# Patient Record
Sex: Female | Born: 1965
Health system: Southern US, Community
[De-identification: ages and names within clinical notes are randomized; demographics above are authoritative.]

## PROBLEM LIST (undated history)

## (undated) DIAGNOSIS — M1990 Unspecified osteoarthritis, unspecified site: Secondary | ICD-10-CM

## (undated) DIAGNOSIS — F32A Depression, unspecified: Secondary | ICD-10-CM

## (undated) DIAGNOSIS — K219 Gastro-esophageal reflux disease without esophagitis: Secondary | ICD-10-CM

## (undated) DIAGNOSIS — T7840XA Allergy, unspecified, initial encounter: Secondary | ICD-10-CM

## (undated) DIAGNOSIS — J45909 Unspecified asthma, uncomplicated: Secondary | ICD-10-CM

## (undated) HISTORY — DX: Unspecified asthma, uncomplicated: J45.909

## (undated) HISTORY — PX: COLONOSCOPY: SHX174

## (undated) HISTORY — DX: Gastro-esophageal reflux disease without esophagitis: K21.9

## (undated) HISTORY — DX: Depression, unspecified: F32.A

## (undated) HISTORY — PX: OTHER SURGICAL HISTORY: SHX169

## (undated) HISTORY — DX: Allergy, unspecified, initial encounter: T78.40XA

## (undated) HISTORY — DX: Unspecified osteoarthritis, unspecified site: M19.90

---

## 1998-06-23 ENCOUNTER — Encounter: Payer: Self-pay | Admitting: Emergency Medicine

## 1998-06-23 ENCOUNTER — Emergency Department (HOSPITAL_COMMUNITY): Admission: EM | Admit: 1998-06-23 | Discharge: 1998-06-23 | Payer: Self-pay | Admitting: Emergency Medicine

## 1999-01-12 ENCOUNTER — Other Ambulatory Visit: Admission: RE | Admit: 1999-01-12 | Discharge: 1999-01-12 | Payer: Self-pay | Admitting: Obstetrics and Gynecology

## 1999-06-07 ENCOUNTER — Inpatient Hospital Stay (HOSPITAL_COMMUNITY): Admission: AD | Admit: 1999-06-07 | Discharge: 1999-06-07 | Payer: Self-pay | Admitting: Obstetrics and Gynecology

## 1999-08-06 ENCOUNTER — Inpatient Hospital Stay (HOSPITAL_COMMUNITY): Admission: AD | Admit: 1999-08-06 | Discharge: 1999-08-08 | Payer: Self-pay | Admitting: Obstetrics and Gynecology

## 1999-08-11 ENCOUNTER — Encounter: Admission: RE | Admit: 1999-08-11 | Discharge: 1999-11-09 | Payer: Self-pay | Admitting: Obstetrics and Gynecology

## 1999-09-17 ENCOUNTER — Other Ambulatory Visit: Admission: RE | Admit: 1999-09-17 | Discharge: 1999-09-17 | Payer: Self-pay | Admitting: Obstetrics and Gynecology

## 1999-11-11 ENCOUNTER — Encounter: Admission: RE | Admit: 1999-11-11 | Discharge: 1999-12-12 | Payer: Self-pay | Admitting: Obstetrics and Gynecology

## 2000-10-03 ENCOUNTER — Other Ambulatory Visit: Admission: RE | Admit: 2000-10-03 | Discharge: 2000-10-03 | Payer: Self-pay | Admitting: Obstetrics and Gynecology

## 2001-02-02 ENCOUNTER — Ambulatory Visit (HOSPITAL_COMMUNITY): Admission: RE | Admit: 2001-02-02 | Discharge: 2001-02-02 | Payer: Self-pay | Admitting: Gastroenterology

## 2002-01-14 ENCOUNTER — Encounter: Payer: Self-pay | Admitting: Internal Medicine

## 2002-01-14 ENCOUNTER — Ambulatory Visit (HOSPITAL_COMMUNITY): Admission: RE | Admit: 2002-01-14 | Discharge: 2002-01-14 | Payer: Self-pay | Admitting: Internal Medicine

## 2003-02-08 ENCOUNTER — Other Ambulatory Visit: Admission: RE | Admit: 2003-02-08 | Discharge: 2003-02-08 | Payer: Self-pay | Admitting: Obstetrics and Gynecology

## 2004-02-10 ENCOUNTER — Other Ambulatory Visit: Admission: RE | Admit: 2004-02-10 | Discharge: 2004-02-10 | Payer: Self-pay | Admitting: Obstetrics and Gynecology

## 2004-07-26 ENCOUNTER — Ambulatory Visit: Payer: Self-pay | Admitting: Internal Medicine

## 2004-08-02 ENCOUNTER — Ambulatory Visit: Payer: Self-pay | Admitting: Internal Medicine

## 2005-01-14 ENCOUNTER — Ambulatory Visit: Payer: Self-pay | Admitting: Internal Medicine

## 2005-07-05 ENCOUNTER — Ambulatory Visit: Payer: Self-pay | Admitting: Internal Medicine

## 2005-10-10 ENCOUNTER — Ambulatory Visit: Payer: Self-pay | Admitting: Internal Medicine

## 2006-04-03 ENCOUNTER — Ambulatory Visit: Payer: Self-pay | Admitting: Internal Medicine

## 2006-04-03 LAB — CONVERTED CEMR LAB
Basophils Absolute: 0 10*3/uL (ref 0.0–0.1)
Basophils Relative: 0.2 % (ref 0.0–1.0)
Eosinophils Absolute: 0.1 10*3/uL (ref 0.0–0.6)
Eosinophils Relative: 1.8 % (ref 0.0–5.0)
HCT: 35.9 % — ABNORMAL LOW (ref 36.0–46.0)
Hemoglobin: 12.6 g/dL (ref 12.0–15.0)
Lymphocytes Relative: 27.3 % (ref 12.0–46.0)
MCHC: 35.1 g/dL (ref 30.0–36.0)
MCV: 93.4 fL (ref 78.0–100.0)
Monocytes Absolute: 0.8 10*3/uL — ABNORMAL HIGH (ref 0.2–0.7)
Monocytes Relative: 10.7 % (ref 3.0–11.0)
Neutro Abs: 4.4 10*3/uL (ref 1.4–7.7)
Neutrophils Relative %: 60 % (ref 43.0–77.0)
Platelets: 251 10*3/uL (ref 150–400)
RBC: 3.84 M/uL — ABNORMAL LOW (ref 3.87–5.11)
RDW: 11.3 % — ABNORMAL LOW (ref 11.5–14.6)
Sed Rate: 17 mm/hr (ref 0–25)
WBC: 7.3 10*3/uL (ref 4.5–10.5)

## 2006-07-29 ENCOUNTER — Ambulatory Visit: Payer: Self-pay | Admitting: Internal Medicine

## 2006-12-15 ENCOUNTER — Encounter: Payer: Self-pay | Admitting: Internal Medicine

## 2007-02-11 ENCOUNTER — Telehealth: Payer: Self-pay | Admitting: Internal Medicine

## 2007-02-28 ENCOUNTER — Ambulatory Visit: Payer: Self-pay | Admitting: Family Medicine

## 2007-02-28 DIAGNOSIS — J020 Streptococcal pharyngitis: Secondary | ICD-10-CM | POA: Insufficient documentation

## 2007-02-28 LAB — CONVERTED CEMR LAB: Rapid Strep: POSITIVE

## 2007-09-15 ENCOUNTER — Telehealth: Payer: Self-pay | Admitting: Internal Medicine

## 2007-11-12 ENCOUNTER — Ambulatory Visit: Payer: Self-pay | Admitting: Internal Medicine

## 2007-11-12 LAB — CONVERTED CEMR LAB
ALT: 16 units/L (ref 0–35)
AST: 22 units/L (ref 0–37)
Albumin: 4.1 g/dL (ref 3.5–5.2)
Alkaline Phosphatase: 58 units/L (ref 39–117)
BUN: 16 mg/dL (ref 6–23)
Basophils Absolute: 0.1 10*3/uL (ref 0.0–0.1)
Basophils Relative: 0.9 % (ref 0.0–3.0)
Bilirubin Urine: NEGATIVE
Bilirubin, Direct: 0.2 mg/dL (ref 0.0–0.3)
CO2: 27 meq/L (ref 19–32)
Calcium: 9.2 mg/dL (ref 8.4–10.5)
Chloride: 106 meq/L (ref 96–112)
Cholesterol: 199 mg/dL (ref 0–200)
Creatinine, Ser: 0.7 mg/dL (ref 0.4–1.2)
Crystals: NEGATIVE
Eosinophils Absolute: 0.1 10*3/uL (ref 0.0–0.7)
Eosinophils Relative: 2.1 % (ref 0.0–5.0)
GFR calc Af Amer: 119 mL/min
GFR calc non Af Amer: 99 mL/min
Glucose, Bld: 96 mg/dL (ref 70–99)
HCT: 35.1 % — ABNORMAL LOW (ref 36.0–46.0)
HDL: 49.9 mg/dL (ref >39.0)
Hemoglobin, Urine: NEGATIVE
Hemoglobin: 12.2 g/dL (ref 12.0–15.0)
Ketones, ur: NEGATIVE mg/dL
LDL Cholesterol: 131 mg/dL — ABNORMAL HIGH (ref 0–99)
Lymphocytes Relative: 24.1 % (ref 12.0–46.0)
MCHC: 34.7 g/dL (ref 30.0–36.0)
MCV: 92.9 fL (ref 78.0–100.0)
Monocytes Absolute: 0.6 10*3/uL (ref 0.1–1.0)
Monocytes Relative: 8.4 % (ref 3.0–12.0)
Neutro Abs: 4.5 10*3/uL (ref 1.4–7.7)
Neutrophils Relative %: 64.5 % (ref 43.0–77.0)
Nitrite: NEGATIVE
Platelets: 229 10*3/uL (ref 150–400)
Potassium: 4.2 meq/L (ref 3.5–5.1)
RBC: 3.78 M/uL — ABNORMAL LOW (ref 3.87–5.11)
RDW: 11 % — ABNORMAL LOW (ref 11.5–14.6)
Sodium: 143 meq/L (ref 135–145)
Specific Gravity, Urine: 1.02 (ref 1.000–1.03)
TSH: 1.61 microintl units/mL (ref 0.35–5.50)
Total Bilirubin: 1 mg/dL (ref 0.3–1.2)
Total CHOL/HDL Ratio: 4
Total Protein: 7 g/dL (ref 6.0–8.3)
Triglycerides: 91 mg/dL (ref 0–149)
Urine Glucose: NEGATIVE mg/dL
Urobilinogen, UA: 0.2 (ref 0.0–1.0)
VLDL: 18 mg/dL (ref 0–40)
WBC: 7 10*3/uL (ref 4.5–10.5)
pH: 6.5 (ref 5.0–8.0)

## 2007-11-17 ENCOUNTER — Ambulatory Visit: Payer: Self-pay | Admitting: Internal Medicine

## 2007-11-17 DIAGNOSIS — K219 Gastro-esophageal reflux disease without esophagitis: Secondary | ICD-10-CM | POA: Insufficient documentation

## 2007-11-17 DIAGNOSIS — J309 Allergic rhinitis, unspecified: Secondary | ICD-10-CM | POA: Insufficient documentation

## 2007-11-17 DIAGNOSIS — F32A Depression, unspecified: Secondary | ICD-10-CM | POA: Insufficient documentation

## 2007-11-17 DIAGNOSIS — F329 Major depressive disorder, single episode, unspecified: Secondary | ICD-10-CM | POA: Insufficient documentation

## 2007-11-17 DIAGNOSIS — F3289 Other specified depressive episodes: Secondary | ICD-10-CM | POA: Insufficient documentation

## 2007-12-01 ENCOUNTER — Ambulatory Visit: Payer: Self-pay | Admitting: Internal Medicine

## 2007-12-01 ENCOUNTER — Encounter: Payer: Self-pay | Admitting: Internal Medicine

## 2007-12-01 DIAGNOSIS — R05 Cough: Secondary | ICD-10-CM

## 2007-12-01 DIAGNOSIS — R059 Cough, unspecified: Secondary | ICD-10-CM | POA: Insufficient documentation

## 2008-01-12 ENCOUNTER — Ambulatory Visit: Payer: Self-pay | Admitting: Internal Medicine

## 2008-01-12 DIAGNOSIS — J45909 Unspecified asthma, uncomplicated: Secondary | ICD-10-CM

## 2008-01-12 DIAGNOSIS — J019 Acute sinusitis, unspecified: Secondary | ICD-10-CM

## 2008-05-23 ENCOUNTER — Ambulatory Visit: Payer: Self-pay | Admitting: Endocrinology

## 2008-05-23 DIAGNOSIS — J069 Acute upper respiratory infection, unspecified: Secondary | ICD-10-CM | POA: Insufficient documentation

## 2008-05-23 LAB — CONVERTED CEMR LAB: Rapid Strep: NEGATIVE

## 2008-06-24 ENCOUNTER — Encounter: Payer: Self-pay | Admitting: Internal Medicine

## 2008-09-06 ENCOUNTER — Encounter: Payer: Self-pay | Admitting: Internal Medicine

## 2009-01-24 ENCOUNTER — Telehealth: Payer: Self-pay | Admitting: Internal Medicine

## 2009-02-27 ENCOUNTER — Encounter: Payer: Self-pay | Admitting: Internal Medicine

## 2009-03-25 ENCOUNTER — Ambulatory Visit: Payer: Self-pay | Admitting: Internal Medicine

## 2009-03-25 DIAGNOSIS — J111 Influenza due to unidentified influenza virus with other respiratory manifestations: Secondary | ICD-10-CM

## 2009-06-27 ENCOUNTER — Ambulatory Visit: Payer: Self-pay | Admitting: Internal Medicine

## 2009-06-27 DIAGNOSIS — J040 Acute laryngitis: Secondary | ICD-10-CM | POA: Insufficient documentation

## 2009-06-27 DIAGNOSIS — J209 Acute bronchitis, unspecified: Secondary | ICD-10-CM | POA: Insufficient documentation

## 2010-01-04 ENCOUNTER — Encounter: Payer: Self-pay | Admitting: Internal Medicine

## 2010-02-27 NOTE — Assessment & Plan Note (Signed)
Summary: COUGH/CHEST HURTS TO COUGH/2 WKS /NWS   Vital Signs:  Patient profile:   45 year old female Height:      63 inches Weight:      157 pounds BMI:     27.91 O2 Sat:      98 % on Room air Temp:     98.8 degrees F oral Pulse rate:   93 / minute BP sitting:   110 / 70  (left arm) Cuff size:   regular  Vitals Entered By: Lucious Groves (Jun 27, 2009 4:54 PM)  O2 Flow:  Room air CC: C/O cough that is causing sore throat and chest discomfort x2 weeks. Pt also notes fever, mucous production, wheezing, and ear pain./kb Is Patient Diabetic? No Pain Assessment Patient in pain? no      Comments Patient denies any chest discomfort prior to cough. Patient also notes that she has began taking spouses cipro./kb   CC:  C/O cough that is causing sore throat and chest discomfort x2 weeks. Pt also notes fever, mucous production, wheezing, and and ear pain./kb.  History of Present Illness: The patient presents with complaints of sore throat, fever, cough, sinus congestion and drainge of several days duration. Not better with OTC meds. Chest hurts with coughing. Can't sleep due to cough. Muscle aches are present.  The mucus is colored. Can't sing - hoarse. C/o wheezing.   Current Medications (verified): 1)  Wellbutrin Xl 150 Mg  Tb24 (Bupropion Hcl) .Marland Kitchen.. 1 Once Daily in Am 2)  Zegerid 40-1100 Mg Caps (Omeprazole-Sodium Bicarbonate) .Marland Kitchen.. 1 Tab Once A Day  Allergies (verified): 1)  Avelox  Past History:  Past Medical History: Last updated: 01/12/2008 Depression GERD Allergic rhinitis Gyn Dr Arelia Sneddon Poss. asthma  Social History: Last updated: 12/01/2007 Occupation: Married Former Smoker Regular exercise-yes Alcohol use-no Drug use-no  Physical Exam  General:  NAD,  non toxic, congested in head Ears:  Fluid behind B TMs Mouth:  Erythematous throat mucosa and intranasal erythema.  Lungs:  CTA Heart:  RRR Abdomen:  Bowel sounds positive,abdomen soft and non-tender without  masses, organomegaly or hernias noted. Skin:  Intact without suspicious lesions or rashes Cervical Nodes:  R anterior LN tender and L anterior LN tender.     Impression & Recommendations:  Problem # 1:  ASTHMA (ICD-493.90) - asthmatic bronchitis Assessment Deteriorated  Her updated medication list for this problem includes:    Proair Hfa 108 (90 Base) Mcg/act Aers (Albuterol sulfate) .Marland Kitchen... 2 inh qid as needed  Orders: Depo- Medrol 80mg  (J1040) Depo- Medrol 40mg  (J1030) Admin of Therapeutic Inj  intramuscular or subcutaneous (16109)  Problem # 2:  BRONCHITIS, ACUTE (ICD-466.0) Assessment: New  Her updated medication list for this problem includes:    Zithromax Z-pak 250 Mg Tabs (Azithromycin) .Marland Kitchen... As dirrected    Tessalon Perles 100 Mg Caps (Benzonatate) .Marland Kitchen... 1-2 by mouth two times a day as needed cogh    Proair Hfa 108 (90 Base) Mcg/act Aers (Albuterol sulfate) .Marland Kitchen... 2 inh qid as needed  Orders: Depo- Medrol 80mg  (J1040) Depo- Medrol 40mg  (J1030) Admin of Therapeutic Inj  intramuscular or subcutaneous (60454)  Problem # 3:  LARYNGITIS, ACUTE (ICD-464.00) Assessment: New See "Patient Instructions".   Problem # 4:  ALLERGIC RHINITIS (ICD-477.9) Assessment: Unchanged  Her updated medication list for this problem includes:    Loratadine 10 Mg Tabs (Loratadine) .Marland Kitchen... 1 by mouth once daily as needed allergies  Complete Medication List: 1)  Wellbutrin Xl 150 Mg Tb24 (Bupropion hcl) .Marland KitchenMarland KitchenMarland Kitchen  1 once daily in am 2)  Zegerid 40-1100 Mg Caps (Omeprazole-sodium bicarbonate) .Marland Kitchen.. 1 tab once a day 3)  Loratadine 10 Mg Tabs (Loratadine) .Marland Kitchen.. 1 by mouth once daily as needed allergies 4)  Zithromax Z-pak 250 Mg Tabs (Azithromycin) .... As dirrected 5)  Tessalon Perles 100 Mg Caps (Benzonatate) .Marland Kitchen.. 1-2 by mouth two times a day as needed cogh 6)  Proair Hfa 108 (90 Base) Mcg/act Aers (Albuterol sulfate) .... 2 inh qid as needed  Patient Instructions: 1)  Use over-the-counter medicines  for "cold": Tylenol  650mg  or Advil 400mg  every 6 hours  for fever; Delsym or Robutussin for cough. Mucinex or Mucinex D for congestion. Ricola or Halls for sore throat. Office visit if not better or if worse.  2)  Voice rest Prescriptions: PROAIR HFA 108 (90 BASE) MCG/ACT AERS (ALBUTEROL SULFATE) 2 inh qid as needed  #1 x 3   Entered and Authorized by:   Tresa Garter MD   Signed by:   Tresa Garter MD on 06/27/2009   Method used:   Print then Give to Patient   RxID:   [id number] TESSALON PERLES 100 MG CAPS (BENZONATATE) 1-2 by mouth two times a day as needed cogh  #120 x 1   Entered and Authorized by:   Tresa Garter MD   Signed by:   Tresa Garter MD on 06/27/2009   Method used:   Print then Give to Patient   RxID:   [id number] QIONGEXBM Z-PAK 250 MG TABS (AZITHROMYCIN) as dirrected  #1 x 0   Entered and Authorized by:   Tresa Garter MD   Signed by:   Tresa Garter MD on 06/27/2009   Method used:   Print then Give to Patient   RxID:   [id number] LORATADINE 10 MG TABS (LORATADINE) 1 by mouth once daily as needed allergies  #30 x 6   Entered and Authorized by:   Tresa Garter MD   Signed by:   Tresa Garter MD on 06/27/2009   Method used:   Print then Give to Patient   RxID:   [id number] PROAIR HFA 108 (90 BASE) MCG/ACT AERS (ALBUTEROL SULFATE) 2 inh qid as needed  #3 x 3   Entered and Authorized by:   Tresa Garter MD   Signed by:   Tresa Garter MD on 06/27/2009   Method used:   Electronically to        [facility] [address] [phone]* (retail)       [address]       [place], [place]  [address]       Ph: [phone] or [phone]       Fax: [phone]   RxID:   [id number] TESSALON PERLES 100 MG CAPS (BENZONATATE) 1-2 by mouth two times a day as needed cogh  #120 x 1   Entered and Authorized by:   Tresa Garter MD   Signed by:   Tresa Garter MD on  06/27/2009   Method used:   Electronically to        [facility] [address] [phone]* (retail)       [address]       [place], [place]  [address]       Ph: [phone] or [phone]       Fax: [phone]   RxID:  [id number] ZITHROMAX Z-PAK 250 MG TABS (AZITHROMYCIN) as dirrected  #1 x 0   Entered and Authorized by:   Tresa Garter MD   Signed by:   Tresa Garter MD on 06/27/2009   Method used:   Electronically to        [facility] [address] [phone]* (retail)       [address]       [place], [place]  [address]       Ph: [phone] or [phone]       Fax: [phone]   RxID:   [id number] LORATADINE 10 MG TABS (LORATADINE) 1 by mouth once daily as needed allergies  #30 x 6   Entered and Authorized by:   Tresa Garter MD   Signed by:   Tresa Garter MD on 06/27/2009   Method used:   Electronically to        [facility] [address] [phone]* (retail)       [address]       [place], [place]  [address]       Ph: [phone] or [phone]       Fax: [phone]   RxID:   [id number]    Medication Administration  Injection # 1:    Medication: Depo- Medrol 80mg     Diagnosis: ASTHMA (ICD-493.90)    Route: IM    Site: LUOQ gluteus    Exp Date: 04/28/2012    Lot #: [id number]    Mfr: Pharmacia    Patient tolerated injection without complications    Given by: Lamar Sprinkles, CMA (Jun 27, 2009 6:10 PM)  Injection # 2:    Medication: Depo- Medrol 40mg     Diagnosis: ASTHMA (ICD-493.90)    Comments: Same as above     Patient tolerated injection without complications    Given by: Lamar Sprinkles, CMA (Jun 27, 2009 6:10 PM)  Orders Added: 1)  Depo- Medrol 80mg  [J1040] 2)  Depo- Medrol 40mg  [J1030] 3)  Admin of Therapeutic Inj  intramuscular or subcutaneous [96372] 4)  Est. Patient Level IV [60737]

## 2010-02-27 NOTE — Procedures (Signed)
Summary: Colonoscopy/[facility]  Colonoscopy/[facility]   Imported By: Sherian Rein 03/15/2009 10:05:33  _____________________________________________________________________  External Attachment:    Type:   Image     Comment:   External Document

## 2010-02-27 NOTE — Assessment & Plan Note (Signed)
Summary: FLU LIKE,HAD FEVER,LIL COUGH/PN   Vital Signs:  Patient profile:   45 year old female Height:      63 inches Weight:      155 pounds BMI:     27.56 O2 Sat:      97 % Temp:     98.5 degrees F Pulse rate:   78 / minute BP sitting:   120 / 80  Vitals Entered By: Lamar Sprinkles, CMA (March 25, 2009 11:35 AM) CC: Pt c/o fever, 101, tuesday thru thursday. Also c/o sinus congestion/pressure, ear pain & non productive cough. Has tried OTC delsym & cold meds.   History of Present Illness: Shannon [name] comesin today for   for [facility] because of above reasons. Onset with fever 3 cough 4 days ago.  and temp 101 range   now down to 99 and fever broke yesterday but has severe fatigue and myalgias and sinuses are tender and clogged.  No cp or sob.   Didnt get flu shot this year.   Wonders if a shot of steroids would help her fatigue as she has had this before for some problems  No asthma flare .   Preventive Screening-Counseling & Management  Alcohol-Tobacco     Smoking Status: quit     Passive Smoke Exposure: yes  Caffeine-Diet-Exercise     Does Patient Exercise: yes  Current Medications (verified): 1)  Wellbutrin Xl 150 Mg  Tb24 (Bupropion Hcl) .Marland Kitchen.. 1 Once Daily in Am 2)  Zegerid 40-1100 Mg Caps (Omeprazole-Sodium Bicarbonate) .Marland Kitchen.. 1 Tab Once A Day  Allergies (verified): 1)  Avelox  Past History:  Past medical, surgical, family and social histories (including risk factors) reviewed for relevance to current acute and chronic problems.  Past Medical History: Reviewed history from 01/12/2008 and no changes required. Depression GERD Allergic rhinitis Gyn Dr Arelia Sneddon Poss. asthma  Past Surgical History: Reviewed history from 12/01/2007 and no changes required. Denies surgical history  Family History: Reviewed history from 11/17/2007 and no changes required. Well  Social History: Reviewed history from 12/01/2007 and no changes  required. Occupation: Married Former Smoker Regular exercise-yes Alcohol use-no Drug use-no  Review of Systems       The patient complains of anorexia, fever, and headaches.  The patient denies vision loss, hoarseness, chest pain, syncope, dyspnea on exertion, peripheral edema, prolonged cough, hemoptysis, abdominal pain, melena, hematochezia, unusual weight change, abnormal bleeding, enlarged lymph nodes, and angioedema.         some sore throat  Physical Exam  General:  tired non toxic in nad   congested in head Head:  normocephalic and atraumatic.   Eyes:  vision grossly intact, pupils equal, and pupils round.   Ears:  R ear normal, L ear normal, and no external deformities.   Nose:  no external deformity, no external erythema, and no nasal discharge.  congested mild facila tenderness Mouth:  pharynx pink and moist.  mild erythema no edema Neck:  No deformities, masses, or tenderness noted. Lungs:  Normal respiratory effort, chest expands symmetrically. Lungs are clear to auscultation, no crackles or wheezes.no dullness.   Heart:  Normal rate and regular rhythm. S1 and S2 normal without gallop, murmur, click, rub or other extra sounds.no lifts.   Pulses:  pulses intact without delay   nl cap arefill  Extremities:  no clubbing cyanosis or edema  Neurologic:  non focal  nl gait  Skin:  turgor normal, color normal, no ecchymoses, no petechiae, and no purpura.   Cervical  Nodes:  No lymphadenopathy noted Psych:  Oriented X3, good eye contact, not anxious appearing, and not depressed appearing.     Impression & Recommendations:  Problem # 1:  INFLUENZA LIKE ILLNESS (ICD-487.1) with sinusitis signs but  no alarm features . ne evidence of pneumonia  or other complications  decline cough med rx .   to restart on her nasal steroids  Problem # 2:  ALLERGIC RHINITIS (ICD-477.9) restart her nasal steroids  disc  lifetime risk of systemic steroids  for osteoporosis  as her mom has this.   The following medications were removed from the medication list:    Astelin 137 Mcg/spray Soln (Azelastine hcl) .Marland Kitchen... 1 gtt 1 each nostr at bedtime  Complete Medication List: 1)  Wellbutrin Xl 150 Mg Tb24 (Bupropion hcl) .Marland Kitchen.. 1 once daily in am 2)  Zegerid 40-1100 Mg Caps (Omeprazole-sodium bicarbonate) .Marland Kitchen.. 1 tab once a day  Patient Instructions: 1)  saline decongestants     2)  Acute sinusitis symptoms for less than 10 days are not helped by antibiotics. Use warm moist compresses, and over the counter decongestants( only as directed). Call if no improvement in 5-7 days, sooner if increasing pain, fever, or new symptoms.  3)  rest fluids

## 2010-03-01 NOTE — Letter (Signed)
Summary: [facility]  [facility]   Imported By: Sherian Rein 01/26/2010 09:15:09  _____________________________________________________________________  External Attachment:    Type:   Image     Comment:   External Document

## 2010-03-02 NOTE — Procedures (Signed)
Summary: Panendoscopy/[facility]  Panendoscopy/[facility]   Imported By: Sherian Rein 03/15/2009 10:06:35  _____________________________________________________________________  External Attachment:    Type:   Image     Comment:   External Document

## 2010-03-16 ENCOUNTER — Ambulatory Visit: Payer: Self-pay | Admitting: Internal Medicine

## 2010-03-19 ENCOUNTER — Encounter: Payer: Self-pay | Admitting: Internal Medicine

## 2010-03-19 ENCOUNTER — Ambulatory Visit (INDEPENDENT_AMBULATORY_CARE_PROVIDER_SITE_OTHER): Payer: BC Managed Care – PPO | Admitting: Internal Medicine

## 2010-03-19 DIAGNOSIS — J45909 Unspecified asthma, uncomplicated: Secondary | ICD-10-CM

## 2010-03-19 DIAGNOSIS — L089 Local infection of the skin and subcutaneous tissue, unspecified: Secondary | ICD-10-CM

## 2010-03-19 DIAGNOSIS — S90859A Superficial foreign body, unspecified foot, initial encounter: Secondary | ICD-10-CM

## 2010-03-19 DIAGNOSIS — J019 Acute sinusitis, unspecified: Secondary | ICD-10-CM

## 2010-03-27 NOTE — Assessment & Plan Note (Signed)
Summary: GLASS IN FOOT SINCE AUG/NWS   Vital Signs:  Patient profile:   45 year old female Height:      63 inches Weight:      160.13 pounds BMI:     28.47 O2 Sat:      95 % on Room air Temp:     99 degrees F oral Pulse rate:   90 / minute BP sitting:   100 / 68  (left arm) Cuff size:   regular  Vitals Entered By: Zella Ball Ewing CMA Duncan Dull) (March 19, 2010 9:32 AM)  O2 Flow:  Room air CC: Right foot pain, stepped on glass/RE   CC:  Right foot pain and stepped on glass/RE.  History of Present Illness: pt of dr plotnikov who is here with 3 days acute onset facial pain, pressure, feverish and greenish d/c, wtih mild HA, general weakness and malaise, but Pt denies CP, worsening sob, doe, wheezing, night time awakenings with dyspnea and rare use of inhaler;  no orthopnea, pnd, worsening LE edema, palps, dizziness or syncope  Pt denies new neuro symptoms such as  facial or extremity weakness  Pt denies polydipsia, polyuria.  Did also step on glass she thinks with right foot with some glass in the foot she thought she got out, but now with 3 mo gradually increased pain now quite painful mod to severe with each step in the same area.  No fever, redness, swelling , drainage .  No recent wt loss, night sweats, loss of appetite or other constitutional symptoms   Problems Prior to Update: 1)  Foot&toe Sup Fb Without Major Open Wound Inf  (ICD-917.7) 2)  Sinusitis- Acute-nos  (ICD-461.9) 3)  Laryngitis, Acute  (ICD-464.00) 4)  Bronchitis, Acute  (ICD-466.0) 5)  Influenza Like Illness  (ICD-487.1) 6)  Uri  (ICD-465.9) 7)  Asthma  (ICD-493.90) 8)  Sinusitis, Acute  (ICD-461.9) 9)  Cough  (ICD-786.2) 10)  Well Adult Exam  (ICD-V70.0) 11)  Allergic Rhinitis  (ICD-477.9) 12)  Gerd  (ICD-530.81) 13)  Depression  (ICD-311) 14)  Streptococcal Pharyngitis  (ICD-034.0)  Medications Prior to Update: 1)  Wellbutrin Xl 150 Mg  Tb24 (Bupropion Hcl) .Marland Kitchen.. 1 Once Daily in Am 2)  Zegerid 40-1100 Mg Caps  (Omeprazole-Sodium Bicarbonate) .Marland Kitchen.. 1 Tab Once A Day 3)  Loratadine 10 Mg Tabs (Loratadine) .Marland Kitchen.. 1 By Mouth Once Daily As Needed Allergies 4)  Tessalon Perles 100 Mg Caps (Benzonatate) .Marland Kitchen.. 1-2 By Mouth Two Times A Day As Needed Cogh 5)  Proair Hfa 108 (90 Base) Mcg/act Aers (Albuterol Sulfate) .... 2 Inh Qid As Needed  Current Medications (verified): 1)  Wellbutrin Xl 150 Mg  Tb24 (Bupropion Hcl) .Marland Kitchen.. 1 Once Daily in Am 2)  Zegerid 40-1100 Mg Caps (Omeprazole-Sodium Bicarbonate) .Marland Kitchen.. 1 Tab Once A Day 3)  Loratadine 10 Mg Tabs (Loratadine) .Marland Kitchen.. 1 By Mouth Once Daily As Needed Allergies 4)  Tessalon Perles 100 Mg Caps (Benzonatate) .Marland Kitchen.. 1-2 By Mouth Two Times A Day As Needed Cogh 5)  Proair Hfa 108 (90 Base) Mcg/act Aers (Albuterol Sulfate) .... 2 Inh Qid As Needed 6)  Azithromycin 250 Mg Tabs (Azithromycin) .... 2po Qd For 1 Day, Then 1po Qd For 4days, Then Stop  Allergies (verified): 1)  Avelox  Past History:  Past Medical History: Last updated: 01/12/2008 Depression GERD Allergic rhinitis Gyn Dr Arelia Sneddon Poss. 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   Eyes:  vision grossly intact, pupils equal, and pupils round.   Ears:  bilat tm's erythema without bulging,  canals clear, sinus tender  bilat Nose:  nasal dischargemucosal pallor and mucosal edema.   Mouth:  pharyngeal erythema and fair dentition.   Neck:  supple and no masses.   Lungs:  normal respiratory effort and normal breath sounds.   Heart:  normal rate and regular rhythm.   Extremities:  tender area to right 5th MTP area - ? corn/wart vs retained foreign body? - o/w no edema, no erythema , fluctuance or drainage Skin:  color normal and no rashes.     Impression & Recommendations:  Problem # 1:  SINUSITIS- ACUTE-NOS (ICD-461.9)  Her updated medication list for this problem includes:    Tessalon Perles 100 Mg Caps (Benzonatate) .Marland Kitchen... 1-2 by mouth two times a day as needed cogh    Azithromycin 250 Mg Tabs (Azithromycin) .Marland Kitchen... 2po qd for 1 day, then 1po qd for 4days, then stop treat as above, f/u any worsening signs or symptoms   Problem # 2:  FOOT&TOE SUP FB WITHOUT MAJOR OPEN WOUND INF (ICD-917.7)  vs corn or wart - for podiatry referral, no film needed if FB is glass as will not show on film  Orders: Podiatry Referral (Podiatry)  Problem # 3:  ASTHMA (ICD-493.90)  Her updated medication list for this problem includes:    Proair Hfa 108 (90 Base) Mcg/act Aers (Albuterol sulfate) .Marland Kitchen... 2 inh qid as needed stable overall by hx and exam, ok to continue meds/tx as is   Pulmonary Functions Reviewed: O2 sat: 95 (03/19/2010)  Complete Medication List: 1)  Wellbutrin Xl 150 Mg Tb24 (Bupropion hcl) .Marland Kitchen.. 1 once daily in am 2)  Zegerid 40-1100 Mg Caps (Omeprazole-sodium bicarbonate) .Marland Kitchen.. 1 tab once a day 3)  Loratadine 10 Mg Tabs (Loratadine) .Marland Kitchen.. 1 by mouth once daily as needed allergies 4)  Tessalon Perles 100 Mg Caps (Benzonatate) .Marland Kitchen.. 1-2 by mouth two times a day as needed cogh 5)  Proair Hfa 108 (90 Base) Mcg/act Aers (Albuterol sulfate) .... 2 inh qid as needed 6)  Azithromycin 250 Mg Tabs (Azithromycin) .... 2po qd for 1 day, then 1po qd for 4days, then stop  Patient Instructions: 1)  Please take all new  medications as prescribed 2)  Continue all previous medications as before this visit  3)  You will be contacted about the referral(s) to: podiatry 4)  Please schedule an appointment with your primary doctor as needed Prescriptions: AZITHROMYCIN 250 MG TABS (AZITHROMYCIN) 2po qd for 1 day, then 1po qd for 4days, then stop  #6 x 1   Entered and Authorized by:   Corwin Levins MD   Signed by:   Corwin Levins MD on 03/19/2010   Method used:   Print then Give to Patient   RxID:   [id number]    Orders Added: 1)  Podiatry Referral [Podiatry] 2)  Est. Patient Level IV [14782]

## 2010-03-30 ENCOUNTER — Encounter: Payer: Self-pay | Admitting: Internal Medicine

## 2010-04-10 NOTE — Letter (Signed)
Summary: [facility]   Imported By: Lester Ashley 04/05/2010 10:10:14  _____________________________________________________________________  External Attachment:    Type:   Image     Comment:   External Document

## 2010-06-12 NOTE — Assessment & Plan Note (Signed)
[facility]                           PRIMARY CARE OFFICE NOTE   NAME:[name], Shannon ANN                  MRN:          [id number]  DATE:07/29/2006                            DOB:          1965/10/17    PROCEDURE:  Cryosurgery.   INDICATION:  Wart right lateral ankle.   The risk and benefits explained to patient.  She agreed to proceed.  She  was placed in the decubitus position.  A 3 mm wart on the right distal  lateral of the shin was treated with liquid nitrogen in the usual  fashion.  She tolerated it well, complications none.     Georgina Quint. Plotnikov, MD  Electronically Signed    AVP/MedQ  DD: 08/01/2006  DT: 08/01/2006  Job #: [id number]

## 2010-09-03 ENCOUNTER — Telehealth: Payer: Self-pay | Admitting: *Deleted

## 2010-09-03 MED ORDER — ZOLPIDEM TARTRATE 10 MG PO TABS: 10.0000 mg | ORAL_TABLET | Freq: Every evening | ORAL | Status: AC | PRN

## 2010-09-03 NOTE — Telephone Encounter (Signed)
Patient requesting Rx for ambien. She is having trouble sleeping x 2 weeks. Next ov is 09/2010.

## 2010-09-03 NOTE — Telephone Encounter (Signed)
Ok Thx 

## 2010-09-04 NOTE — Telephone Encounter (Signed)
Pt is aware - rx was faxed to pharm

## 2010-09-17 ENCOUNTER — Telehealth: Payer: Self-pay

## 2010-09-17 NOTE — Telephone Encounter (Signed)
[facility] Triage Call Report Triage Record Num: [id number] Operator: Lodema Pilot Patient Name: Shannon [name] Call Date & Time: 09/16/2010 9:36:12AM Patient Phone: [phone] PCP: Sonda Primes Patient Gender: Female PCP Fax : [phone] Patient DOB: [birth year] Practice Name: [facility] Reason for Call: Briyanna is calling about sore throat x 1- 2 days. Afebrile. Neck swollen. Per Sore Throat Protocol, advised pt to see a MD within 24 hours - UC or call office in AM. Home care advice given. Protocol(s) Used: Sore Throat or Hoarseness Recommended Outcome per Protocol: See Provider within 24 hours Reason for Outcome: Has enlarged tonsils covered by yellow-white patches Care Advice: ~ Use a cool mist humidifier to moisten air. Be sure to clean according to manufacturer's instructions. Avoid exposure to environmental irritants. Do not smoke and avoid second-hand smoke. Avoid outside activities on high pollution days. Instead of strong smelling commercial cleaning products, substitute vinegar and lemon juice. ~ Antibiotics may be prescribed by a provider after confirmation of strep throat or in a high risk situation, until a diagnosis is confirmed. Inform provider of any previous reaction or sensitivity to penicillin. Complete entire course of antibiotics, even when feeling better, if ordered by your provider. ~ ~ HEALTH PROMOTION / MAINTENANCE ~ SYMPTOM / CONDITION MANAGEMENT ~ CAUTIONS Analgesic/Antipyretic Advice - Acetaminophen: Consider acetaminophen as directed on label or by pharmacist/provider for pain or fever PRECAUTIONS: - Use if there is no history of liver disease, alcoholism, or intake of three or more alcohol drinks per day - Only if approved by provider during pregnancy or when breastfeeding - During pregnancy, acetaminophen should not be taken more than 3 consecutive days without telling provider - Do not exceed recommended dose or frequency ~ ~ Call 911 if  voice muffled, is unable to swallow own saliva and is drooling or choking sensation. Sore Throat Relief: - Use warm salt water gargles 3 to 4 times/day, as needed (1/2 tsp. salt in 8 oz. [.2 liters] water). - Suck on hard candy, nonprescription or herbal throat lozenges (sugar-free if diabetic) - Eat soothing, soft food/fluids (broths, soups, or honey and lemon juice in hot tea, Popsicles, frozen yogurt or sherbet, scrambled eggs, cooked cereals, Jell-O or puddings) whichever is most comforting. - Avoid eating salty, spicy or acidic foods. ~ Analgesic/Antipyretic Advice - NSAIDs: Consider aspirin, ibuprofen, naproxen or ketoprofen for pain or fever as directed on label or by pharmacist/provider. PRECAUTIONS: - If over 46 years of age, should not take longer than 1 week without consulting provider. EXCEPTIONS: - Should not be used if taking blood thinners or have bleeding problems. - Do not use if have history of sensitivity/allergy to any of these medications; or history of cardiovascular, ulcer, kidney, liver disease or diabetes unless approved by provider. - Do not exceed recommended dose or frequency. ~ 09/16/2010 9:46:54AM Page 1 of 1 [id number]

## 2010-09-22 ENCOUNTER — Encounter: Payer: Self-pay | Admitting: Family Medicine

## 2010-09-22 ENCOUNTER — Ambulatory Visit (INDEPENDENT_AMBULATORY_CARE_PROVIDER_SITE_OTHER): Payer: BC Managed Care – PPO | Admitting: Family Medicine

## 2010-09-22 VITALS — BP 120/80 | Temp 98.7°F | Wt 159.1 lb

## 2010-09-22 DIAGNOSIS — J0100 Acute maxillary sinusitis, unspecified: Secondary | ICD-10-CM

## 2010-09-22 MED ORDER — AMOXICILLIN-POT CLAVULANATE 875-125 MG PO TABS: 1.0000 | ORAL_TABLET | Freq: Two times a day (BID) | ORAL | Status: AC

## 2010-09-22 NOTE — Progress Notes (Signed)
  Subjective:    Patient ID: Shannon [name], female    DOB: 1965-07-01, 45 y.o.   MRN: [id number]  HPI   Shannon [name], a 45 y.o. female presents today in the office for the following:    Last Sunday, had a bad sore throat and fever. Cough is very bad at night. Also has some bad green drainage. 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   Fever was 101.  Facial pain, tooth, jaw - pain Green mucous drainage from nose Some cough No nausea, vomitting or diarrhea  Taking Amox 1500 mg po once daily right now without improvement  The PMH, PSH, Social History, Family History, Medications, and allergies have been reviewed in [facility], and have been updated if relevant.  Review of Systems ROS: GEN: Acute illness details above GI: Tolerating PO intake GU: maintaining adequate hydration and urination Pulm: No SOB Interactive and getting along well at home.  Otherwise, ROS is as per the HPI.     Objective:   Physical Exam   Physical Exam  Blood pressure 120/80, temperature 98.7 F (37.1 C), temperature source Oral, weight 159 lb 1.3 oz (72.158 kg).  Gen: WDWN, NAD; alert,appropriate and cooperative throughout exam  HEENT: Normocephalic and atraumatic. Throat clear, w/o exudate, no LAD, R TM clear, L TM - good landmarks, No fluid present. rhinnorhea.  Left frontal and maxillary sinuses: Tender at max Right frontal and maxillary sinuses: Tender at max  Neck: No ant or post LAD CV: RRR, No M/G/R Pulm: Breathing comfortably in no resp distress. no w/c/r Abd: S,NT,ND,+BS Extr: no c/c/e Psych: full affect, pleasant       Assessment & Plan:   1. Sinusitis, acute maxillary  amoxicillin-clavulanate (AUGMENTIN) 875-125 MG per tablet   A/P: 1. Acute sinusitis: ABX as below.  Refer to the patient instructions sections for details of plan shared with patient.  Reviewed symptomatic care as well as ABX in this case.

## 2010-09-22 NOTE — Patient Instructions (Signed)
SINUSITIS Sinuses are cavities in facial skeleton that drain to nose. Impaired drainage and obstruction of sinus passages main cause.  Treatment: 1. Take all Antibiotics 2. Open nasal and sinus canals: Oral decongestant: Sudafed. (CAUTION IF HIGH BLOOD PRESSURE) 3. Steam inhalation 4. Humidifier in room 5. Frequent nasal saline irrigation 6. Moist heat compresses to face 7. Tylenol or Ibuprofen for pain and fever, follow directions on bottle.  

## 2010-10-02 ENCOUNTER — Ambulatory Visit: Payer: BC Managed Care – PPO

## 2010-10-02 DIAGNOSIS — Z0000 Encounter for general adult medical examination without abnormal findings: Secondary | ICD-10-CM

## 2010-10-02 LAB — HEPATIC FUNCTION PANEL
ALT: 19 U/L (ref 0–35)
AST: 18 U/L (ref 0–37)
Alkaline Phosphatase: 66 U/L (ref 39–117)
Bilirubin, Direct: 0.1 mg/dL (ref 0.0–0.3)
Total Protein: 6.9 g/dL (ref 6.0–8.3)

## 2010-10-02 LAB — URINALYSIS
Bilirubin Urine: NEGATIVE
Hgb urine dipstick: NEGATIVE
Leukocytes, UA: NEGATIVE
Nitrite: NEGATIVE
Urobilinogen, UA: 0.2 (ref 0.0–1.0)

## 2010-10-02 LAB — BASIC METABOLIC PANEL
BUN: 13 mg/dL (ref 6–23)
CO2: 27 mEq/L (ref 19–32)
GFR: 115.73 mL/min (ref >60.00)
Glucose, Bld: 93 mg/dL (ref 70–99)
Potassium: 3.7 mEq/L (ref 3.5–5.1)
Sodium: 140 mEq/L (ref 135–145)

## 2010-10-02 LAB — CBC WITH DIFFERENTIAL/PLATELET
Basophils Relative: 0.4 % (ref 0.0–3.0)
Eosinophils Absolute: 0.3 10*3/uL (ref 0.0–0.7)
MCHC: 34.6 g/dL (ref 30.0–36.0)
MCV: 94 fl (ref 78.0–100.0)
Monocytes Absolute: 0.6 10*3/uL (ref 0.1–1.0)
Neutrophils Relative %: 65.8 % (ref 43.0–77.0)
RBC: 3.73 Mil/uL — ABNORMAL LOW (ref 3.87–5.11)

## 2010-10-02 LAB — LIPID PANEL
HDL: 46.2 mg/dL (ref >39.00)
LDL Cholesterol: 105 mg/dL — ABNORMAL HIGH (ref 0–99)
Total CHOL/HDL Ratio: 4
VLDL: 24.2 mg/dL (ref 0.0–40.0)

## 2010-10-05 ENCOUNTER — Ambulatory Visit (INDEPENDENT_AMBULATORY_CARE_PROVIDER_SITE_OTHER): Payer: BC Managed Care – PPO | Admitting: Internal Medicine

## 2010-10-05 ENCOUNTER — Encounter: Payer: Self-pay | Admitting: Internal Medicine

## 2010-10-05 DIAGNOSIS — E669 Obesity, unspecified: Secondary | ICD-10-CM

## 2010-10-05 DIAGNOSIS — Z23 Encounter for immunization: Secondary | ICD-10-CM

## 2010-10-05 DIAGNOSIS — Z0000 Encounter for general adult medical examination without abnormal findings: Secondary | ICD-10-CM

## 2010-10-05 DIAGNOSIS — K219 Gastro-esophageal reflux disease without esophagitis: Secondary | ICD-10-CM

## 2010-10-05 MED ORDER — VITAMIN D 1000 UNITS PO TABS: 1000.0000 [IU] | ORAL_TABLET | Freq: Every day | ORAL | Status: DC

## 2010-10-05 MED ORDER — ALPRAZOLAM 0.25 MG PO TABS: 0.2500 mg | ORAL_TABLET | Freq: Two times a day (BID) | ORAL | Status: DC

## 2010-10-05 MED ORDER — OMEPRAZOLE-SODIUM BICARBONATE 40-1100 MG PO CAPS: 1.0000 | ORAL_CAPSULE | Freq: Every day | ORAL | Status: DC

## 2010-10-05 MED ORDER — PHENTERMINE HCL 37.5 MG PO TABS: 37.5000 mg | ORAL_TABLET | Freq: Every day | ORAL | Status: AC

## 2010-10-05 NOTE — Progress Notes (Signed)
  Subjective:    Patient ID: Shannon [name], female    DOB: 08-13-1965, 45 y.o.   MRN: [id number]  HPI  The patient is here for a wellness exam. The patient has been doing well overall without major physical or psychological issues going on lately. C/o wt gain  Review of Systems  Constitutional: Positive for unexpected weight change (wt gain). Negative for fever, chills, diaphoresis, activity change, appetite change and fatigue.  HENT: Negative for hearing loss, ear pain, congestion, sore throat, sneezing, mouth sores, neck pain, dental problem, voice change, postnasal drip and sinus pressure.   Eyes: Negative for pain and visual disturbance.  Respiratory: Negative for cough, chest tightness, wheezing and stridor.   Cardiovascular: Positive for palpitations. Negative for chest pain and leg swelling.  Gastrointestinal: Negative for nausea, vomiting, abdominal pain, blood in stool, abdominal distention and rectal pain.  Genitourinary: Negative for dysuria, hematuria, decreased urine volume, vaginal bleeding, vaginal discharge, difficulty urinating, vaginal pain and menstrual problem.  Musculoskeletal: Negative for back pain, joint swelling and gait problem.  Skin: Negative for color change, rash and wound.  Neurological: Negative for dizziness, tremors, syncope, speech difficulty and light-headedness.  Hematological: Negative for adenopathy.  Psychiatric/Behavioral: Negative for suicidal ideas, hallucinations, behavioral problems, confusion, sleep disturbance, dysphoric mood and decreased concentration. The patient is not hyperactive.    Wt Readings from Last 3 Encounters:  10/05/10 159 lb (72.122 kg)  09/22/10 159 lb 1.3 oz (72.158 kg)  03/19/10 160 lb 2.1 oz (72.635 kg)       Objective:   Physical Exam  Constitutional: She appears well-developed and well-nourished. No distress.  HENT:  Head: Normocephalic.  Right Ear: External ear normal.  Left Ear: External ear normal.  Nose:  Nose normal.  Mouth/Throat: Oropharynx is clear and moist.  Eyes: Conjunctivae are normal. Pupils are equal, round, and reactive to light. Right eye exhibits no discharge. Left eye exhibits no discharge.  Neck: Normal range of motion. Neck supple. No JVD present. No tracheal deviation present. No thyromegaly present.  Cardiovascular: Normal rate, regular rhythm and normal heart sounds.   Pulmonary/Chest: No stridor. No respiratory distress. She has no wheezes.  Abdominal: Soft. Bowel sounds are normal. She exhibits no distension and no mass. There is no tenderness. There is no rebound and no guarding.  Musculoskeletal: She exhibits no edema and no tenderness.  Lymphadenopathy:    She has no cervical adenopathy.  Neurological: She displays normal reflexes. No cranial nerve deficit. She exhibits normal muscle tone. Coordination normal.  Skin: No rash noted. No erythema.  Psychiatric: She has a normal mood and affect. Her behavior is normal. Judgment and thought content normal.     Lab Results  Component Value Date   WBC 8.0 10/02/2010   HGB 12.1 10/02/2010   HCT 35.0* 10/02/2010   PLT 290.0 10/02/2010   CHOL 175 10/02/2010   TRIG 121.0 10/02/2010   HDL 46.20 10/02/2010   ALT 19 10/02/2010   AST 18 10/02/2010   NA 140 10/02/2010   K 3.7 10/02/2010   CL 106 10/02/2010   CREATININE 0.6 10/02/2010   BUN 13 10/02/2010   CO2 27 10/02/2010   TSH 2.05 10/02/2010        Assessment & Plan:

## 2010-10-05 NOTE — Assessment & Plan Note (Addendum)
Xanax prn  Potential benefits of a long term benzodiazepines  use as well as potential risks  and complications were explained to the patient and were aknowledged. 

## 2010-10-07 ENCOUNTER — Encounter: Payer: Self-pay | Admitting: Internal Medicine

## 2010-10-07 NOTE — Assessment & Plan Note (Signed)
We discussed age appropriate health related issues, including available/recomended screening tests and vaccinations. We discussed a need for adhering to healthy diet and exercise. Labs/EKG were reviewed/ordered. All questions were answered.   

## 2010-10-07 NOTE — Assessment & Plan Note (Signed)
2012 Mild OK to try Adipex w/caution  Potential benefits of a short term adipex use as well as potential risks  and complications were explained to the patient and were aknowledged.

## 2010-10-23 ENCOUNTER — Telehealth: Payer: Self-pay | Admitting: *Deleted

## 2010-10-23 DIAGNOSIS — R05 Cough: Secondary | ICD-10-CM

## 2010-10-23 NOTE — Telephone Encounter (Signed)
Pt calling she is still coughing and has completed 2 rounds of Abx. She is asking if she needs any other meds or a CXR? Please advise.

## 2010-10-23 NOTE — Telephone Encounter (Signed)
OK CXR Is it ok to sch a pulm cons? Thx

## 2010-10-24 NOTE — Telephone Encounter (Signed)
Left mess for patient to call back.  

## 2010-10-29 ENCOUNTER — Telehealth: Payer: Self-pay | Admitting: Internal Medicine

## 2010-10-29 ENCOUNTER — Ambulatory Visit (INDEPENDENT_AMBULATORY_CARE_PROVIDER_SITE_OTHER)
Admission: RE | Admit: 2010-10-29 | Discharge: 2010-10-29 | Disposition: A | Discharge status: Home / Self Care | Payer: BC Managed Care – PPO | Source: Ambulatory Visit | Attending: Internal Medicine | Admitting: Internal Medicine

## 2010-10-29 DIAGNOSIS — R05 Cough: Secondary | ICD-10-CM

## 2010-10-29 NOTE — Telephone Encounter (Signed)
Shannon [name], please, inform patient that her CXR was nl Thx

## 2010-10-29 NOTE — Telephone Encounter (Signed)
I spoke to pt's husband. Pt is at her allergist's office now. He states he will inform pt of below and have her call me back if she would like to proceed with pulmonary consult. Closing phone note

## 2010-10-30 NOTE — Telephone Encounter (Signed)
Left detailed mess informing pt of below.  

## 2011-03-10 ENCOUNTER — Other Ambulatory Visit: Payer: Self-pay

## 2011-03-10 ENCOUNTER — Emergency Department (HOSPITAL_COMMUNITY)
Admission: EM | Admit: 2011-03-10 | Discharge: 2011-03-10 | Disposition: A | Discharge status: Home / Self Care | Payer: BC Managed Care – PPO | Attending: Cardiology | Admitting: Cardiology

## 2011-03-10 ENCOUNTER — Encounter (HOSPITAL_COMMUNITY): Payer: Self-pay | Admitting: *Deleted

## 2011-03-10 ENCOUNTER — Emergency Department (HOSPITAL_COMMUNITY): Payer: BC Managed Care – PPO

## 2011-03-10 DIAGNOSIS — R079 Chest pain, unspecified: Secondary | ICD-10-CM | POA: Insufficient documentation

## 2011-03-10 DIAGNOSIS — K219 Gastro-esophageal reflux disease without esophagitis: Secondary | ICD-10-CM | POA: Insufficient documentation

## 2011-03-10 LAB — DIFFERENTIAL
Eosinophils Relative: 1 % (ref 0–5)
Lymphocytes Relative: 22 % (ref 12–46)
Lymphs Abs: 1.8 10*3/uL (ref 0.7–4.0)
Neutro Abs: 5.7 10*3/uL (ref 1.7–7.7)

## 2011-03-10 LAB — APTT: aPTT: 32 seconds (ref 24–37)

## 2011-03-10 LAB — TROPONIN I: Troponin I: <0.3 ng/mL (ref <0.30)

## 2011-03-10 LAB — CBC
HCT: 37.9 % (ref 36.0–46.0)
MCV: 93.6 fL (ref 78.0–100.0)
Platelets: 242 10*3/uL (ref 150–400)
RBC: 4.05 MIL/uL (ref 3.87–5.11)
WBC: 8.2 10*3/uL (ref 4.0–10.5)

## 2011-03-10 LAB — COMPREHENSIVE METABOLIC PANEL
Albumin: 3.7 g/dL (ref 3.5–5.2)
BUN: 13 mg/dL (ref 6–23)
Creatinine, Ser: 0.61 mg/dL (ref 0.50–1.10)
GFR calc Af Amer: >90 mL/min (ref >90)
Total Protein: 7.2 g/dL (ref 6.0–8.3)

## 2011-03-10 LAB — PROTIME-INR: Prothrombin Time: 14.1 seconds (ref 11.6–15.2)

## 2011-03-10 LAB — CARDIAC PANEL(CRET KIN+CKTOT+MB+TROPI)
CK, MB: 1.5 ng/mL (ref 0.3–4.0)
Total CK: 79 U/L (ref 7–177)
Troponin I: <0.3 ng/mL (ref <0.30)

## 2011-03-10 NOTE — ED Notes (Signed)
Spoke with Dr Anne Fu, verbal order received to repeat troponin at 1230, if this is negative pt is okayed to be discharged home with followup for stress test via treadmill

## 2011-03-10 NOTE — H&P (Signed)
Admit date: 03/10/2011 Primary Cardiologist  none  CC: Chest pain  HPI: 46 year old waking up all night long with chest pain, sweating, maybe mild SOB, nausea no vomiting. Tired. Episodes lasted 15 min. Has GERD, takes Zegrid. Bland dinner, no wine, no citris. Felt different than GERD. Felt like it was shooting to back a couple of times. One time last summer, felt pulse rate increased-panic attack. She thinks this may be similar. First episode was at 10pm when laying down. Feels better now. Feels dull pain at the moment.   No cough, no fevers, +sweats, +sinus congestion, no syncope, mild dizzy, being treated for rash on both sides of chest. Treated by derm.     PMH:   Past Medical History  Diagnosis Date  . Acid reflux     PSH:  History reviewed. No pertinent past surgical history. Allergies:  Moxifloxacin Prior to Admit Meds:   (Not in a hospital admission) Fam HX:   History reviewed. No pertinent family history. Father- AFIB age 24 No early CAD  Social HX:    History   Social History  . Marital Status: Married    Spouse Name: N/A    Number of Children: N/A  . Years of Education: N/A   Occupational History  . 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 Smoking status: Former Games developer  . Smokeless tobacco: Not on file  . Alcohol Use: No  . Drug Use: No  . Sexually Active: Yes   Other Topics Concern  . Not on file   Social History Narrative   Regular Exercise- yes     ROS:  All 11 ROS were addressed and are negative except what is stated in the HPI  Physical Exam: Blood pressure 114/63, pulse 85, temperature 97.7 F (36.5 C), temperature source Axillary, resp. rate 20, SpO2 98.00%.    General: Well developed, well nourished, in no acute distress Head: Eyes PERRLA, No xanthomas.   Normal cephalic and atramatic  Lungs:   Clear bilaterally to auscultation and percussion. Normal respiratory effort. No wheezes, no rales. Heart:   HRRR S1 S2 Pulses  are 2+ & equal.            No carotid bruit. No JVD.  No abdominal bruits. No femoral bruits. Abdomen: Bowel sounds are positive, abdomen soft and non-tender without masses or                  Hernia's noted. No hepatosplenomegaly. Msk:  Back normal, normal gait. Normal strength and tone for age. Extremities:   No clubbing, cyanosis or edema.  DP +1 Neuro: Alert and oriented X 3, non-focal, MAE x 4 GU: Deferred Rectal: Deferred Psych:  Good affect, responds appropriately    Labs:   Lab Results  Component Value Date   WBC 8.2 03/10/2011   HGB 13.2 03/10/2011   HCT 37.9 03/10/2011   MCV 93.6 03/10/2011   PLT 242 03/10/2011   No results found for this basename: NA,K,CL,CO2,BUN,CREATININE,CALCIUM,LABALBU,PROT,BILITOT,ALKPHOS,ALT,AST,GLUCOSE in the last 168 hours No results found for this basename: PTT   Lab Results  Component Value Date   INR 1.07 03/10/2011   No results found for this basename: CKTOTAL, CKMB, CKMBINDEX, TROPONINI     Lab Results  Component Value Date   CHOL 175 10/02/2010   CHOL 199 11/12/2007   Lab Results  Component Value Date   HDL 46.20 10/02/2010   HDL 49.9 11/12/2007   Lab Results  Component Value Date  LDLCALC 105* 10/02/2010   LDLCALC 131* 11/12/2007   Lab Results  Component Value Date   TRIG 121.0 10/02/2010   TRIG 91 11/12/2007   Lab Results  Component Value Date   CHOLHDL 4 10/02/2010   CHOLHDL 4.0 CALC 11/12/2007   No results found for this basename: LDLDIRECT      Radiology:  Dg Chest 2 View  03/10/2011  *RADIOLOGY REPORT*  Clinical Data: Chest pain  CHEST - 2 VIEW  Comparison: 10/29/2010  Findings: Cardiomediastinal silhouette is stable.  No acute infiltrate or pleural effusion.  No pulmonary edema.  Bony thorax is stable.  IMPRESSION: No active disease.  No significant change.  Original Report Authenticated By: Natasha Mead, M.D.   Personally viewed.   EKG:  NSR no ST changes Personally viewed.  ASSESSMENT/PLAN:   46 year old with chest  pain.   Chest pain - No DM, no HTN, no smoking, no early CAD history, no hyperlipidemia. First markers are normal. CBC, CMET ok. CXR ok, ECG ok. No evidence of ACS. If second set of markers are normal at 12:30pm (pain started last night at 10pm), then I am fine with her discharge from ER with close follow up for stress test (ETT). Other etiologies include GERD, MSK, anxiety. 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 Her husband is also undergoing a dispute with engineering partner, stressful. Two daughters. Sweats may be from abdominal vagal reaction. So far, reassuring workup  If symptoms worsen, she knows to contact me.   She stated it was OK for me to discuss her case with Dr. Mayford Knife, a friend of her.  Answered all questions from her and husband.   Donato Schultz, MD  03/10/2011  11:21 AM

## 2011-03-10 NOTE — ED Notes (Signed)
Placed back on monitor. Husband at bedside.

## 2011-03-10 NOTE — ED Notes (Signed)
Dr skains at bedside 

## 2011-03-10 NOTE — ED Notes (Signed)
Pt reports mid chest discomfort that started last night, feels different than indigestion. Dr Jayme Cloud is coming in to see the pt. No acute distress noted at triage, airway intact, ekg done.

## 2011-04-05 ENCOUNTER — Ambulatory Visit (INDEPENDENT_AMBULATORY_CARE_PROVIDER_SITE_OTHER): Payer: BC Managed Care – PPO | Admitting: Internal Medicine

## 2011-04-05 ENCOUNTER — Encounter: Payer: Self-pay | Admitting: Internal Medicine

## 2011-04-05 VITALS — BP 122/90 | HR 80 | Temp 99.0°F

## 2011-04-05 DIAGNOSIS — J069 Acute upper respiratory infection, unspecified: Secondary | ICD-10-CM

## 2011-04-05 DIAGNOSIS — J45909 Unspecified asthma, uncomplicated: Secondary | ICD-10-CM

## 2011-04-05 DIAGNOSIS — K219 Gastro-esophageal reflux disease without esophagitis: Secondary | ICD-10-CM | POA: Insufficient documentation

## 2011-04-05 MED ORDER — AZITHROMYCIN 250 MG PO TABS: ORAL_TABLET | ORAL | Status: AC

## 2011-04-05 MED ORDER — PREDNISONE (PAK) 10 MG PO TABS: 10.0000 mg | ORAL_TABLET | ORAL | Status: AC

## 2011-04-05 NOTE — Patient Instructions (Signed)
It was good to see you today. Zpak antibiotics and Prednisone dose pak -Your prescription(s) have been submitted to your pharmacy. Please take as directed and contact our office if you believe you are having problem(s) with the medication(s). Alternate between ibuprofen and tylenol for aches, pain and fever symptoms as discussed Call if symptoms unimproved in next 7-10d, sooner if worse

## 2011-04-05 NOTE — Progress Notes (Signed)
  Subjective:    HPI  complains of cold symptoms  Onset >1 week ago, progresive symptoms  associated with rhinorrhea, sneezing, sore throat, laryngitis, mild headache and low grade fever Now myalgias, sinus pressure and mild-mod chest congestion/cough No relief with OTC meds Precipitated by sick contacts  Past Medical History  Diagnosis Date  . GERD (gastroesophageal reflux disease)     Review of Systems Constitutional: No night sweats, no unexpected weight change Pulmonary: No pleurisy or hemoptysis Cardiovascular: No chest pain or palpitations     Objective:   Physical Exam BP 122/90  Pulse 80  Temp(Src) 99 F (37.2 C) (Oral)  SpO2 97% GEN: mildly ill appearing and audible chest congestion HENT: NCAT, mild sinus tenderness bilaterally, nares with yellow discharge, oropharynx mild erythema, no exudate Eyes: Vision grossly intact, no conjunctivitis Lungs: few scattered rhonchi, soft end exp wheeze, no increased work of breathing at rest Cardiovascular: Regular rate and rhythm, no bilateral edema  Lab Results  Component Value Date   WBC 8.2 03/10/2011   HGB 13.2 03/10/2011   HCT 37.9 03/10/2011   PLT 242 03/10/2011   GLUCOSE 100* 03/10/2011   CHOL 175 10/02/2010   TRIG 121.0 10/02/2010   HDL 46.20 10/02/2010   LDLCALC 105* 10/02/2010   ALT 18 03/10/2011   AST 15 03/10/2011   NA 139 03/10/2011   K 3.8 03/10/2011   CL 105 03/10/2011   CREATININE 0.61 03/10/2011   BUN 13 03/10/2011   CO2 23 03/10/2011   TSH 2.05 10/02/2010   INR 1.07 03/10/2011      Assessment & Plan:  Viral URI with cough Asthma, mild exacerbation due to URI   Empiric antibiotics prescribed due to symptom duration greater than 7 days and comorbid disease Pred taper for bronchospasm/asthma flare and Alb MDI prn Prescription cough suppression, tessalon   Symptomatic care with Tylenol or Advil, hydration and rest -  salt gargle advised as needed

## 2012-09-24 ENCOUNTER — Other Ambulatory Visit: Payer: Self-pay | Admitting: *Deleted

## 2012-09-24 ENCOUNTER — Telehealth: Payer: Self-pay | Admitting: *Deleted

## 2012-09-24 MED ORDER — ALPRAZOLAM 0.25 MG PO TABS: 0.2500 mg | ORAL_TABLET | Freq: Two times a day (BID) | ORAL | Status: AC

## 2012-09-24 NOTE — Telephone Encounter (Signed)
MD advised me ok to fill her Alprazolam x 1 month and call her to schedule OV. Refill phoned in. Will have schedulers contact pt for OV.

## 2012-11-10 ENCOUNTER — Encounter: Payer: Self-pay | Admitting: Internal Medicine

## 2012-11-10 ENCOUNTER — Ambulatory Visit (INDEPENDENT_AMBULATORY_CARE_PROVIDER_SITE_OTHER): Payer: BC Managed Care – PPO | Admitting: Internal Medicine

## 2012-11-10 VITALS — BP 130/90 | HR 72 | Temp 98.7°F | Resp 16 | Ht 63.0 in | Wt 165.0 lb

## 2012-11-10 DIAGNOSIS — R202 Paresthesia of skin: Secondary | ICD-10-CM

## 2012-11-10 DIAGNOSIS — E669 Obesity, unspecified: Secondary | ICD-10-CM

## 2012-11-10 DIAGNOSIS — Z0000 Encounter for general adult medical examination without abnormal findings: Secondary | ICD-10-CM

## 2012-11-10 DIAGNOSIS — Z23 Encounter for immunization: Secondary | ICD-10-CM

## 2012-11-10 DIAGNOSIS — R209 Unspecified disturbances of skin sensation: Secondary | ICD-10-CM

## 2012-11-10 MED ORDER — ALPRAZOLAM 0.25 MG PO TABS: 0.2500 mg | ORAL_TABLET | Freq: Two times a day (BID) | ORAL | Status: DC | PRN

## 2012-11-10 NOTE — Assessment & Plan Note (Signed)
R side paresthesia - episodic Neurol ref Dr Vickey Huger

## 2012-11-10 NOTE — Progress Notes (Signed)
  Subjective:    Patient ID: Shannon [name], female    DOB: [birth year], 47 y.o.   MRN: [id number]  HPI  The patient is here for a wellness exam. The patient has been doing well overall without major physical or psychological issues going on lately except for numbness - warm feeling on the R side x several weeks off and on; it may last for several min-hrs. She had it last last night.  Review of Systems  Constitutional: Negative for fever, chills, diaphoresis, activity change, appetite change, fatigue and unexpected weight change.  HENT: Negative for congestion, dental problem, ear pain, hearing loss, mouth sores, postnasal drip, sinus pressure, sneezing, sore throat and voice change.   Eyes: Negative for pain and visual disturbance.  Respiratory: Negative for cough, chest tightness, wheezing and stridor.   Cardiovascular: Negative for chest pain, palpitations and leg swelling.  Gastrointestinal: Negative for nausea, vomiting, abdominal pain, blood in stool, abdominal distention and rectal pain.  Genitourinary: Negative for dysuria, hematuria, decreased urine volume, vaginal bleeding, vaginal discharge, difficulty urinating, vaginal pain and menstrual problem.  Musculoskeletal: Negative for back pain, gait problem, joint swelling and neck pain.  Skin: Negative for color change, rash and wound.  Neurological: Negative for dizziness, tremors, syncope, speech difficulty and light-headedness.  Hematological: Negative for adenopathy.  Psychiatric/Behavioral: Negative for suicidal ideas, hallucinations, behavioral problems, confusion, sleep disturbance, dysphoric mood and decreased concentration. The patient is not hyperactive.        Objective:   Physical Exam  Constitutional: She appears well-developed. No distress.  HENT:  Head: Normocephalic.  Right Ear: External ear normal.  Left Ear: External ear normal.  Nose: Nose normal.  Mouth/Throat: Oropharynx is clear and moist.  Eyes:  Conjunctivae are normal. Pupils are equal, round, and reactive to light. Right eye exhibits no discharge. Left eye exhibits no discharge.  Neck: Normal range of motion. Neck supple. No JVD present. No tracheal deviation present. No thyromegaly present.  Cardiovascular: Normal rate, regular rhythm and normal heart sounds.   Pulmonary/Chest: No stridor. No respiratory distress. She has no wheezes.  Abdominal: Soft. Bowel sounds are normal. She exhibits no distension and no mass. There is no tenderness. There is no rebound and no guarding.  Musculoskeletal: She exhibits no edema and no tenderness.  Lymphadenopathy:    She has no cervical adenopathy.  Neurological: She displays normal reflexes. No cranial nerve deficit. She exhibits normal muscle tone. Coordination normal.  Skin: No rash noted. No erythema.  Psychiatric: She has a normal mood and affect. Her behavior is normal. Judgment and thought content normal.          Assessment & Plan:

## 2012-11-10 NOTE — Assessment & Plan Note (Signed)
Wt Readings from Last 3 Encounters:  11/10/12 165 lb (74.844 kg)  10/05/10 159 lb (72.122 kg)  09/22/10 159 lb 1.3 oz (72.158 kg)

## 2012-11-18 ENCOUNTER — Other Ambulatory Visit (INDEPENDENT_AMBULATORY_CARE_PROVIDER_SITE_OTHER): Payer: BC Managed Care – PPO

## 2012-11-18 DIAGNOSIS — R209 Unspecified disturbances of skin sensation: Secondary | ICD-10-CM

## 2012-11-18 DIAGNOSIS — E669 Obesity, unspecified: Secondary | ICD-10-CM

## 2012-11-18 DIAGNOSIS — Z0000 Encounter for general adult medical examination without abnormal findings: Secondary | ICD-10-CM

## 2012-11-18 DIAGNOSIS — Z23 Encounter for immunization: Secondary | ICD-10-CM

## 2012-11-18 DIAGNOSIS — R202 Paresthesia of skin: Secondary | ICD-10-CM

## 2012-11-18 LAB — HEPATIC FUNCTION PANEL
ALT: 16 U/L (ref 0–35)
AST: 17 U/L (ref 0–37)
Albumin: 4.2 g/dL (ref 3.5–5.2)
Alkaline Phosphatase: 51 U/L (ref 39–117)
Bilirubin, Direct: 0.1 mg/dL (ref 0.0–0.3)
Total Protein: 7.4 g/dL (ref 6.0–8.3)

## 2012-11-18 LAB — CBC WITH DIFFERENTIAL/PLATELET
Basophils Absolute: 0 10*3/uL (ref 0.0–0.1)
Basophils Relative: 0.6 % (ref 0.0–3.0)
Eosinophils Absolute: 0.2 10*3/uL (ref 0.0–0.7)
HCT: 37.2 % (ref 36.0–46.0)
Hemoglobin: 13 g/dL (ref 12.0–15.0)
Lymphocytes Relative: 24.9 % (ref 12.0–46.0)
Lymphs Abs: 1.9 10*3/uL (ref 0.7–4.0)
MCHC: 35.1 g/dL (ref 30.0–36.0)
MCV: 92.4 fl (ref 78.0–100.0)
Neutro Abs: 5 10*3/uL (ref 1.4–7.7)
RBC: 4.03 Mil/uL (ref 3.87–5.11)
RDW: 11.8 % (ref 11.5–14.6)

## 2012-11-18 LAB — BASIC METABOLIC PANEL
CO2: 26 mEq/L (ref 19–32)
Chloride: 106 mEq/L (ref 96–112)
Glucose, Bld: 98 mg/dL (ref 70–99)
Potassium: 4.1 mEq/L (ref 3.5–5.1)
Sodium: 140 mEq/L (ref 135–145)

## 2012-11-18 LAB — VITAMIN B12: Vitamin B-12: 301 pg/mL (ref 211–911)

## 2012-11-18 LAB — URINALYSIS, ROUTINE W REFLEX MICROSCOPIC
Ketones, ur: NEGATIVE
Specific Gravity, Urine: >=1.03 (ref 1.000–1.030)
Urine Glucose: NEGATIVE
Urobilinogen, UA: 0.2 (ref 0.0–1.0)

## 2012-11-18 LAB — PROTIME-INR: Prothrombin Time: 12.8 s — ABNORMAL HIGH (ref 10.2–12.4)

## 2012-11-18 LAB — LIPID PANEL
HDL: 51.3 mg/dL (ref >39.00)
Total CHOL/HDL Ratio: 4

## 2012-12-23 ENCOUNTER — Ambulatory Visit (INDEPENDENT_AMBULATORY_CARE_PROVIDER_SITE_OTHER): Payer: BC Managed Care – PPO | Admitting: Neurology

## 2012-12-23 ENCOUNTER — Encounter: Payer: Self-pay | Admitting: Neurology

## 2012-12-23 VITALS — BP 120/78 | HR 78 | Temp 98.1°F | Resp 12 | Ht 63.0 in | Wt 162.6 lb

## 2012-12-23 DIAGNOSIS — R209 Unspecified disturbances of skin sensation: Secondary | ICD-10-CM

## 2012-12-23 DIAGNOSIS — R6889 Other general symptoms and signs: Secondary | ICD-10-CM

## 2012-12-23 LAB — HEMOGLOBIN A1C: Hgb A1c MFr Bld: 5.2 % (ref 4.6–6.5)

## 2012-12-23 LAB — FOLATE: Folate: 18.1 ng/mL (ref >5.9)

## 2012-12-23 NOTE — Patient Instructions (Addendum)
1.  Check blood work today  2.  MRI brain wwo contrast December 9@3pm  [phone] 3.  Return clinic 34-months

## 2012-12-23 NOTE — Progress Notes (Signed)
[facility] Neurology Division Clinic Note - Initial Visit   Date: 12/23/2012    Shannon [name] MRN: [id number] DOB: 1965-03-04   Dear Dr Posey Rea:  Thank you for your kind referral of Shannon [name] for consultation of numbness of the right side of her body. Although her history is well known to you, please allow [place] to reiterate it for the purpose of our medical record. The patient was accompanied to the clinic by self.   History of Present Illness: Shannon [name] is a 47 y.o. right-handed Caucasian female with history of GERD presenting for evaluation of right sided numbness.  She reports having intermittent spells of numbness/tingling involving the right face, arm, and leg which started about 24-months ago.  She feels as if her hand and foot is asleep.  She usually has symptoms at night, but it does not wake her up from sleeping.  Denies any triggers. No alleviating factors.   She has noticed that symptoms are worse with stress.  She endorses a significant amount of stress related to finances, her husband loosing his job 2 years ago, and he has health problems.   Denies any headaches, changes in vision, or weakness.  No history of vision loss or color desaturation.  She has occasional dry eyes and dry mouth, complains of abdominal bloating and gas for the past 94-months.  Out-side paper records, electronic medical record, and images have been reviewed where available and summarized as:  Component     Latest Ref Rng 11/18/2012  Vitamin B-12     211 - 911 pg/mL 301  TSH     0.35 - 5.50 uIU/mL 1.47  Sed Rate     0 - 22 mm/hr 27 (H)    Past Medical History  Diagnosis Date  . GERD (gastroesophageal reflux disease)     Past Surgical History  Procedure Laterality Date  . None       Medications:  Current Outpatient Prescriptions on File Prior to Visit  Medication Sig Dispense Refill  . ALPRAZolam (XANAX) 0.25 MG tablet Take 1 tablet (0.25 mg total)  by mouth 2 (two) times daily as needed for sleep.  30 tablet  2  . omeprazole-sodium bicarbonate (ZEGERID) 40-1100 MG per capsule Take 1 capsule by mouth daily before breakfast.  90 capsule  3   No current facility-administered medications on file prior to visit.    Allergies:  Allergies  Allergen Reactions  . Moxifloxacin     REACTION: palpitations    Family History: Family History  Problem Relation Age of Onset  . Depression Daughter   . Healthy Mother   . Healthy Father     Social History: History   Social History  . Marital Status: Married    Spouse Name: N/A    Number of Children: N/A  . Years of Education: N/A   Occupational History  . 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 Smoking status: Never Smoker   . Smokeless tobacco: Not on file  . Alcohol Use: Yes     Comment: Occasionally drink wine (1x per week)  . Drug Use: No  . Sexual Activity: Yes   Other Topics Concern  . Not on file   Social History Narrative   Regular Exercise- yes      She works as a IT trainer at [employer]   She lives with husband and two daughters.    Review of Systems:  CONSTITUTIONAL: No fevers, chills, night  sweats, or weight loss.   EYES: No visual changes or eye pain ENT: No hearing changes.  No history of nose bleeds.   RESPIRATORY: No cough, wheezing and shortness of breath.   CARDIOVASCULAR: Negative for chest pain, and palpitations.   GI: + abdominal discomfort, blood in stools or black stools.  + change in bowel habits.   GU:  No history of incontinence.   MUSCLOSKELETAL: No history of joint pain or swelling.  No myalgias.   SKIN: Negative for lesions, rash, and itching.   HEMATOLOGY/ONCOLOGY: Negative for prolonged bleeding, bruising easily, and swollen nodes.    ENDOCRINE: Negative for cold or heat intolerance, polydipsia or goiter.   PSYCH:  No depression or anxiety symptoms.   NEURO: As Above.   Vital Signs:  BP 120/78   Pulse 78  Temp(Src) 98.1 F (36.7 C)  Resp 12  Ht 5\' 3"  (1.6 m)  Wt 162 lb 9.6 oz (73.755 kg)  BMI 28.81 kg/m2  Neurological Exam: MENTAL STATUS including orientation to time, place, person, recent and remote memory, attention span and concentration, language, and fund of knowledge is normal.  Speech is not dysarthric.  CRANIAL NERVES: II:  No visual field defects.  Unremarkable fundi.   III-IV-VI: Pupils equal round and reactive to light.  Normal conjugate, extra-ocular eye movements in all directions of gaze.  No nystagmus.  No ptosis.   V:  Slightly reduced pin prick over V1-V3 on the right.  Normal temperature and light touch bilaterally.  VII:  Normal facial symmetry and movements.   VIII:  Normal hearing and vestibular function.   IX-X:  Normal palatal movement.   XI:  Normal shoulder shrug and head rotation.   XII:  Normal tongue strength and range of motion, no deviation or fasciculation.  MOTOR:  No atrophy, fasciculations or abnormal movements.  No pronator drift.  Tone is normal.    Right Upper Extremity:    Left Upper Extremity:    Deltoid  5/5   Deltoid  5/5   Biceps  5/5   Biceps  5/5   Triceps  5/5   Triceps  5/5   Wrist extensors  5/5   Wrist extensors  5/5   Wrist flexors  5/5   Wrist flexors  5/5   Finger extensors  5/5   Finger extensors  5/5   Finger flexors  5/5   Finger flexors  5/5   Dorsal interossei  5/5   Dorsal interossei  5/5   Abductor pollicis  5/5   Abductor pollicis  5/5   Tone (Ashworth scale)  0  Tone (Ashworth scale)  0   Right Lower Extremity:    Left Lower Extremity:    Hip flexors  5/5   Hip flexors  5/5   Hip extensors  5/5   Hip extensors  5/5   Knee flexors  5/5   Knee flexors  5/5   Knee extensors  5/5   Knee extensors  5/5   Dorsiflexors  5/5   Dorsiflexors  5/5   Plantarflexors  5/5   Plantarflexors  5/5   Toe extensors  5/5   Toe extensors  5/5   Toe flexors  5/5   Toe flexors  5/5   Tone (Ashworth scale)  0  Tone (Ashworth  scale)  0   MSRs:  Right  Left brachioradialis 2+  brachioradialis 2+  biceps 2+  biceps 2+  triceps 2+  triceps 2+  patellar 2+  patellar 2+  ankle jerk 2+  ankle jerk 2+  Hoffman no  Hoffman no  plantar response down  plantar response down   SENSORY:  Normal and symmetric perception of light touch, pinprick, vibration, and proprioception.  Romberg's sign absent.   COORDINATION/GAIT: Normal finger-to- nose-finger and heel-to-shin.  Intact rapid alternating movements bilaterally.  Able to rise from a chair without using arms.  Gait narrow based and stable. Tandem and stressed gait intact.    IMPRESSION: Ms. Brogden is a 47 year-old female presenting with intermittent paresthesias of the right side of her face, arm, and leg.  Neurological examination is notable for mild reduction in pin prick over the face.  Otherwise, motor strength, reflexes, coordination, and gait is normal.  She has lateralizing sensory symptoms for which I would like to obtain an MRI of the brain.  Although my suspicion is low, I would like to evaluate for demyelinating disease or other intracranial structural process.  Additionally, I will check for other nutritional and autoimmune causes for her sensory disturbance.  Her B12 is borderline-low and she denies being on any vegan/vegetarian diet (?malabsorption).  She also complains of abdominal bloating and severe GERD.      PLAN/RECOMMENDATIONS:  1.  Check MMA, folate, HbA1c, copper, ceruloplasmin, ANA, SSA/SSB, celiac panel, SPEP/UPEP with IFE 2.  MRI brain wwo contrast  3.  Return clinic 77-months.   The duration of this appointment visit was 60 minutes of face-to-face time with the patient.  Greater than 50% of this time was spent in counseling, explanation of diagnosis, planning of further management, and coordination of care.   Thank you for allowing me to participate in patient's care.  If I can  answer any additional questions, I would be pleased to do so.    Sincerely,    Donika K. Allena Katz, DO

## 2012-12-24 LAB — CERULOPLASMIN: Ceruloplasmin: 30 mg/dL (ref 20–60)

## 2012-12-28 LAB — UIFE/LIGHT CHAINS/TP QN, 24-HR UR
Albumin, U: DETECTED
Free Kappa/Lambda Ratio: 6.89 ratio (ref 2.04–10.37)
Free Lambda Lt Chains,Ur: 0.28 mg/dL (ref 0.02–0.67)
Total Protein, Urine: 2.9 mg/dL

## 2012-12-28 LAB — METHYLMALONIC ACID, SERUM: Methylmalonic Acid, Quant: 0.15 umol/L (ref <0.40)

## 2012-12-28 LAB — PROTEIN ELECTROPHORESIS, SERUM
Albumin ELP: 61.4 % (ref 55.8–66.1)
Alpha-2-Globulin: 10.3 % (ref 7.1–11.8)
Total Protein, Serum Electrophoresis: 7.2 g/dL (ref 6.0–8.3)

## 2012-12-29 LAB — GLIADIN ANTIBODIES, SERUM
Gliadin IgA: 3.1 U/mL (ref <20)
Gliadin IgG: 3.4 U/mL (ref <20)

## 2012-12-29 LAB — TISSUE TRANSGLUTAMINASE, IGA: Tissue Transglutaminase Ab, IgA: 3 U/mL (ref <20)

## 2013-01-05 ENCOUNTER — Ambulatory Visit (HOSPITAL_COMMUNITY)
Admission: RE | Admit: 2013-01-05 | Discharge: 2013-01-05 | Disposition: A | Discharge status: Home / Self Care | Payer: BC Managed Care – PPO | Source: Ambulatory Visit | Attending: Neurology | Admitting: Neurology

## 2013-01-05 ENCOUNTER — Telehealth: Payer: Self-pay | Admitting: Neurology

## 2013-01-05 DIAGNOSIS — R5381 Other malaise: Secondary | ICD-10-CM | POA: Insufficient documentation

## 2013-01-05 DIAGNOSIS — R209 Unspecified disturbances of skin sensation: Secondary | ICD-10-CM | POA: Insufficient documentation

## 2013-01-05 DIAGNOSIS — R42 Dizziness and giddiness: Secondary | ICD-10-CM | POA: Insufficient documentation

## 2013-01-05 MED ORDER — GADOBENATE DIMEGLUMINE 529 MG/ML IV SOLN
15.0000 mL | Freq: Once | INTRAVENOUS | Status: AC | PRN
  Administered 2013-01-05: 15 mL via INTRAVENOUS

## 2013-01-05 NOTE — Telephone Encounter (Signed)
Notified patient that her labs an MRI brain are within normal limits. I asked her to take vitamin B12 1000 mcg  supplements daily due to her levels being borderline low and we can reassess her symptoms at her next followup visit.  Donika K. Allena Katz, DO

## 2013-01-25 ENCOUNTER — Encounter: Payer: Self-pay | Admitting: Neurology

## 2013-02-26 ENCOUNTER — Ambulatory Visit: Payer: BC Managed Care – PPO | Admitting: Neurology

## 2013-04-16 ENCOUNTER — Ambulatory Visit (INDEPENDENT_AMBULATORY_CARE_PROVIDER_SITE_OTHER): Payer: BC Managed Care – PPO | Admitting: Neurology

## 2013-04-16 ENCOUNTER — Encounter: Payer: Self-pay | Admitting: Neurology

## 2013-04-16 VITALS — BP 108/78 | HR 81 | Wt 164.1 lb

## 2013-04-16 DIAGNOSIS — E538 Deficiency of other specified B group vitamins: Secondary | ICD-10-CM

## 2013-04-16 DIAGNOSIS — R209 Unspecified disturbances of skin sensation: Secondary | ICD-10-CM

## 2013-04-16 NOTE — Progress Notes (Signed)
Follow-up Visit   Date: 04/16/2013    Cortlynn Hollinsworth MRN: [id number] DOB: 02-May-1965   Interim History: Shannon [name] is a 48 y.o. right-handed Caucasian female with history of GERD returning to the clinic for follow-up of generalized paresthesias.  The patient was accompanied to the clinic by self.  She was last seen in the clinic on 12/23/2012.  History of present illness: She reports having intermittent spells of numbness/tingling involving the right face, arm, and leg which started in the summer of 2014. She feels as if her hand and foot is asleep. She usually has symptoms at night, but it does not wake her up from sleeping.  She has noticed that symptoms are worse with stress. She endorses a significant amount of stress related to finances, her husband loosing his job 2 years ago, and he has health problems.  She has occasional dry eyes and dry mouth, complains of abdominal bloating and gas for the past 55-months.  - Follow-up 04/16/2012:  Significant improvement of right sided numbness/tingling, but still with mild residual numbness of the right cheek.  She reports taking vitamin B12 intermittently.  She reports stress has improved and is now trying to get active and make time for herself.    Medications:  Current Outpatient Prescriptions on File Prior to Visit  Medication Sig Dispense Refill  . omeprazole-sodium bicarbonate (ZEGERID) 40-1100 MG per capsule Take 1 capsule by mouth daily before breakfast.  90 capsule  3   No current facility-administered medications on file prior to visit.    Allergies:  Allergies  Allergen Reactions  . Moxifloxacin     REACTION: palpitations     Review of Systems:  CONSTITUTIONAL: No fevers, chills, night sweats, or weight loss.   EYES: No visual changes or eye pain ENT: No hearing changes.  No history of nose bleeds.   RESPIRATORY: No cough, wheezing and shortness of breath.   CARDIOVASCULAR: Negative for chest pain, and  palpitations.   GI: Negative for abdominal discomfort, blood in stools or black stools.  No recent change in bowel habits.   GU:  No history of incontinence.   MUSCLOSKELETAL: No history of joint pain or swelling.  No myalgias.   SKIN: Negative for lesions, rash, and itching.   ENDOCRINE: Negative for cold or heat intolerance, polydipsia or goiter.   PSYCH:  No depression or anxiety symptoms.   NEURO: As Above.   Vital Signs:  BP 108/78  Pulse 81  Wt 164 lb 1 oz (74.418 kg)  SpO2 100%  Neurological Exam: MENTAL STATUS including orientation to time, place, person, recent and remote memory, attention span and concentration, language, and fund of knowledge is normal.  Speech is not dysarthric.  CRANIAL NERVES: Pupils equal round and reactive to light.  Normal conjugate, extra-ocular eye movements in all directions of gaze.  No ptosis. Normal facial sensation.  Face is symmetric.   MOTOR:  Motor strength is 5/5 in all extremities  MSRs:  Reflexes are 2+/4 throughout.  SENSORY:  Intact to vibration and temperature.  COORDINATION/GAIT:   Gait narrow based and stable.   Data: Labs 12/23/2012:   MMA 0.15, SPEP/UPEP with IFE - neg, ceruloplasmin 30, copper 111, ANA neg, SSA/B - neg, gliadin IgG/IgA, HbA1c 5.2, folate 18.1  MRI brain wwo contrast 01/05/2013:  No abnormalities  IMPRESSION/PLAN: Nonspecific paresthesias of the right face, arm, and leg which seems to be related to stress and have improved since starting vitamin B12 supplements.  Labs and  MRI brain images was personally review with the patient which did not show any abnormalities, except low-normal vitamin B12 levels. Low vitamin B12 levels can be seen with certain medications, including proton pump inhibitors, which she takes for GERD.  I have recommended that she continues vitamin B12 1083mcg by mouth and encouraged healthy lifestyle choices as well as strategies to minimize stress.    I will be happy to see her again if  symptoms worsen.    The duration of this appointment visit was 15 minutes of face-to-face time with the patient.  Greater than 50% of this time was spent in counseling, explanation of diagnosis, planning of further management, and coordination of care.   Thank you for allowing me to participate in patient's care.  If I can answer any additional questions, I would be pleased to do so.    Sincerely,    Donika K. Posey Pronto, DO

## 2013-04-16 NOTE — Patient Instructions (Signed)
1.  Continue taking vitamin B12 1055mcg daily 2.  Return to clinic as needed

## 2014-03-30 ENCOUNTER — Ambulatory Visit (INDEPENDENT_AMBULATORY_CARE_PROVIDER_SITE_OTHER): Payer: BLUE CROSS/BLUE SHIELD | Admitting: Internal Medicine

## 2014-03-30 ENCOUNTER — Encounter: Payer: Self-pay | Admitting: Internal Medicine

## 2014-03-30 VITALS — BP 124/96 | HR 95 | Temp 99.2°F | Ht 63.0 in | Wt 166.0 lb

## 2014-03-30 DIAGNOSIS — J310 Chronic rhinitis: Secondary | ICD-10-CM

## 2014-03-30 DIAGNOSIS — J0110 Acute frontal sinusitis, unspecified: Secondary | ICD-10-CM

## 2014-03-30 MED ORDER — AMOXICILLIN 500 MG PO CAPS: 500.0000 mg | ORAL_CAPSULE | Freq: Three times a day (TID) | ORAL | Status: DC

## 2014-03-30 NOTE — Patient Instructions (Signed)

## 2014-03-30 NOTE — Progress Notes (Signed)
Pre visit review using our clinic review tool, if applicable. No additional management support is needed unless otherwise documented below in the visit note. 

## 2014-03-30 NOTE — Progress Notes (Signed)
   Subjective:    Patient ID: Shannon [name], female    DOB: 01-03-1966, 49 y.o.   MRN: [id number]  HPI Her symptoms started 03/25/14 as postnasal drainage which subsequently was associated with yellow discharge with some bloody material. She treated this with saline and an antihistamine with decongestant. As of 2/29 she developed a nonproductive cough. She's also had extrinsic symptoms of itchy, watery eyes. She has frontal headaches as well as facial pain. She also describes dental pain. Otic pressure is also present. She has some shortness of breath w/o wheezing with the cough . The constellation of symptoms has been associated with fever, chills.  She's never smoked. She has a "touch of asthma" by history.   Review of Systems She denies sweats, purulent sputum, otic pain, otic discharge.    Objective:   Physical Exam  General appearance:Adequately nourished; no acute distress or increased work of breathing is present.  No  lymphadenopathy about the head, neck, or axilla noted.   Eyes: No conjunctival inflammation or lid edema is present. There is no scleral icterus.  Ears:  External ear exam shows no significant lesions or deformities.  Otoscopic examination reveals clear canals, tympanic membranes are intact bilaterally without bulging, retraction, inflammation or discharge.  Nose:  External nasal examination shows no deformity or inflammation. Nasal mucosa are erythematous  without lesions or exudates. No septal dislocation or deviation.No obstruction to airflow.   Oral exam: Dental hygiene is good; lips and gums are healthy appearing.There is no oropharyngeal erythema or exudate noted.   Neck:  No deformities, thyromegaly, masses, or tenderness noted.   Supple with full range of motion without pain.   Heart:  Normal rate and regular rhythm. S1 and S2 normal without gallop, murmur, click, rub or other extra sounds.   Lungs:Chest clear to auscultation; no wheezes, rhonchi,rales  ,or rubs present.  Extremities:  No cyanosis, edema, or clubbing  noted    Skin: Warm & dry w/o jaundice or tenting.      Assessment & Plan:  #1 rhinosinusitis with bronchitis vs rhinitis induced cough  Plan: Nasal hygiene interventions discussed. See prescription medications

## 2014-04-20 ENCOUNTER — Encounter: Payer: Self-pay | Admitting: Internal Medicine

## 2014-04-20 ENCOUNTER — Ambulatory Visit (INDEPENDENT_AMBULATORY_CARE_PROVIDER_SITE_OTHER): Payer: BLUE CROSS/BLUE SHIELD | Admitting: Internal Medicine

## 2014-04-20 VITALS — BP 124/84 | HR 83 | Temp 99.0°F | Resp 17 | Ht 63.0 in | Wt 166.5 lb

## 2014-04-20 DIAGNOSIS — R059 Cough, unspecified: Secondary | ICD-10-CM

## 2014-04-20 DIAGNOSIS — R05 Cough: Secondary | ICD-10-CM

## 2014-04-20 DIAGNOSIS — K219 Gastro-esophageal reflux disease without esophagitis: Secondary | ICD-10-CM | POA: Diagnosis not present

## 2014-04-20 MED ORDER — CEFUROXIME AXETIL 500 MG PO TABS
500.0000 mg | ORAL_TABLET | Freq: Two times a day (BID) | ORAL | Status: DC
Start: 2014-04-20 — End: 2015-12-25

## 2014-04-20 NOTE — Progress Notes (Signed)
   Subjective:    Patient ID: Shannon [name], female    DOB: May 03, 1965, 49 y.o.   MRN: [id number]  HPI  She completed the course of amoxicillin; but she continues to have cough which is productive of thick, yellow sputum only in the morning. She feels this may be related to postnasal drainage. She does have some fatigue as well as sneezing. Her temperature is been as high as 100. She been using her ProAir 1-2 times per day for the last 3 weeks. She does not have an inhaled steroid or maintenance inhaler. She has awakened for the last 3 nights in a row with the cough. Delsym of been of some benefit. She started prednisone left over from a prior prescription.  She also some some reflux at night. She has endoscopy scheduled in June of this year. She continues her Zegerid. She is trying to wean off coffee. The reflux has been aggravated by spicy foods. She does not smoke. She has 1 glass of wine per week.  She has no other cardiovascular, respiratory, GI symptoms.  Review of Systems She denies frontal headache, facial pain, significant nasal purulence, sore throat, dental pain, or otic pain.  There is dysphagia, abdominal pain, melena, rectal bleeding.    Objective:   Physical Exam  General appearance:Adequately nourished; no acute distress or increased work of breathing is present.   BMI:  Lymphatic: No  lymphadenopathy about the head, neck, or axilla .  Eyes: No conjunctival inflammation or lid edema is present. There is no scleral icterus.  Ears:  External ear exam shows no significant lesions or deformities.  Otoscopic examination reveals clear canals, tympanic membranes are intact bilaterally without bulging, retraction, inflammation or discharge. Tympanic membranes are dull  Nose:  External nasal examination shows no deformity or inflammation. Nasal mucosa reveal minor erythema without lesions or exudates No septal dislocation or deviation.No obstruction to airflow.   Oral exam:  Dental hygiene is good; lips and gums are healthy appearing.There is no oropharyngeal erythema or exudate .  Neck:  No deformities, thyromegaly, masses, or tenderness noted.   Supple with full range of motion without pain.   Heart:  Normal rate and regular rhythm. S1 and S2 normal without gallop, murmur, click, rub or other extra sounds.   Lungs:Chest clear to auscultation; no wheezes, rhonchi,rales ,or rubs present.  Extremities:  No cyanosis, edema, or clubbing  noted    Skin: Warm & dry w/o tenting or jaundice. No significant lesions or rash. Temp membranes are dull      Assessment & Plan:  #1 cough with sputum production the morning. This is most likely related postnasal drainage.  #2 reflux  Plan: See orders and recommendations

## 2014-04-20 NOTE — Progress Notes (Signed)
Pre visit review using our clinic review tool, if applicable. No additional management support is needed unless otherwise documented below in the visit note. 

## 2014-04-20 NOTE — Patient Instructions (Addendum)
Plain Mucinex (NOT D) for thick secretions ;force NON dairy fluids .   Nasal cleansing in the shower as discussed with lather of mild shampoo.After 10 seconds wash off lather while  exhaling through nostrils. Make sure that all residual soap is removed to prevent irritation.  Flonase OR Nasacort AQ 1 spray in each nostril twice a day as needed. Use the "crossover" technique into opposite nostril spraying toward opposite ear @ 45 degree angle, not straight up into nostril.  Plain Allegra (NOT D )  160 daily , Loratidine 10 mg , OR Zyrtec 10 mg @ bedtime  as needed for itchy eyes & sneezing.  Breo one inhalation daily; gargle and spit after use. Lot #: [id number] Exp :11/2015  Fill the  prescription for antibiotic if fever, discolored nasal or chest secretions or significant pain above & below eyes appear in the next 48-72 hours.  Reflux of gastric acid may be asymptomatic as this may occur mainly during sleep.The triggers for reflux  include stress; the "aspirin family" ; alcohol; peppermint; and caffeine (coffee, tea, cola, and chocolate). The aspirin family would include aspirin and the nonsteroidal agents such as ibuprofen &  Naproxen. Tylenol would not cause reflux. If having symptoms ; food & drink should be avoided for @ least 2 hours before going to bed.

## 2014-05-06 ENCOUNTER — Other Ambulatory Visit: Payer: Self-pay | Admitting: Allergy

## 2014-05-06 ENCOUNTER — Ambulatory Visit
Admission: RE | Admit: 2014-05-06 | Discharge: 2014-05-06 | Disposition: A | Discharge status: Home / Self Care | Payer: BLUE CROSS/BLUE SHIELD | Source: Ambulatory Visit | Attending: Allergy | Admitting: Allergy

## 2014-05-06 DIAGNOSIS — J4530 Mild persistent asthma, uncomplicated: Secondary | ICD-10-CM

## 2014-11-01 ENCOUNTER — Encounter: Payer: Self-pay | Admitting: Internal Medicine

## 2015-05-01 DIAGNOSIS — J3089 Other allergic rhinitis: Secondary | ICD-10-CM | POA: Diagnosis not present

## 2015-05-01 DIAGNOSIS — J3081 Allergic rhinitis due to animal (cat) (dog) hair and dander: Secondary | ICD-10-CM | POA: Diagnosis not present

## 2015-05-05 DIAGNOSIS — J3081 Allergic rhinitis due to animal (cat) (dog) hair and dander: Secondary | ICD-10-CM | POA: Diagnosis not present

## 2015-05-05 DIAGNOSIS — J3089 Other allergic rhinitis: Secondary | ICD-10-CM | POA: Diagnosis not present

## 2015-05-05 DIAGNOSIS — J301 Allergic rhinitis due to pollen: Secondary | ICD-10-CM | POA: Diagnosis not present

## 2015-05-15 DIAGNOSIS — J3081 Allergic rhinitis due to animal (cat) (dog) hair and dander: Secondary | ICD-10-CM | POA: Diagnosis not present

## 2015-05-15 DIAGNOSIS — J3089 Other allergic rhinitis: Secondary | ICD-10-CM | POA: Diagnosis not present

## 2015-05-15 DIAGNOSIS — J301 Allergic rhinitis due to pollen: Secondary | ICD-10-CM | POA: Diagnosis not present

## 2015-05-23 DIAGNOSIS — J301 Allergic rhinitis due to pollen: Secondary | ICD-10-CM | POA: Diagnosis not present

## 2015-05-23 DIAGNOSIS — J3081 Allergic rhinitis due to animal (cat) (dog) hair and dander: Secondary | ICD-10-CM | POA: Diagnosis not present

## 2015-05-23 DIAGNOSIS — J3089 Other allergic rhinitis: Secondary | ICD-10-CM | POA: Diagnosis not present

## 2015-06-07 DIAGNOSIS — J301 Allergic rhinitis due to pollen: Secondary | ICD-10-CM | POA: Diagnosis not present

## 2015-06-07 DIAGNOSIS — J3081 Allergic rhinitis due to animal (cat) (dog) hair and dander: Secondary | ICD-10-CM | POA: Diagnosis not present

## 2015-06-07 DIAGNOSIS — J3089 Other allergic rhinitis: Secondary | ICD-10-CM | POA: Diagnosis not present

## 2015-06-16 DIAGNOSIS — J301 Allergic rhinitis due to pollen: Secondary | ICD-10-CM | POA: Diagnosis not present

## 2015-06-19 DIAGNOSIS — Z1231 Encounter for screening mammogram for malignant neoplasm of breast: Secondary | ICD-10-CM | POA: Diagnosis not present

## 2015-06-19 DIAGNOSIS — J3089 Other allergic rhinitis: Secondary | ICD-10-CM | POA: Diagnosis not present

## 2015-06-19 DIAGNOSIS — Z6829 Body mass index (BMI) 29.0-29.9, adult: Secondary | ICD-10-CM | POA: Diagnosis not present

## 2015-06-19 DIAGNOSIS — J301 Allergic rhinitis due to pollen: Secondary | ICD-10-CM | POA: Diagnosis not present

## 2015-06-19 DIAGNOSIS — J3081 Allergic rhinitis due to animal (cat) (dog) hair and dander: Secondary | ICD-10-CM | POA: Diagnosis not present

## 2015-06-19 DIAGNOSIS — Z01419 Encounter for gynecological examination (general) (routine) without abnormal findings: Secondary | ICD-10-CM | POA: Diagnosis not present

## 2015-06-29 DIAGNOSIS — J301 Allergic rhinitis due to pollen: Secondary | ICD-10-CM | POA: Diagnosis not present

## 2015-06-29 DIAGNOSIS — J3081 Allergic rhinitis due to animal (cat) (dog) hair and dander: Secondary | ICD-10-CM | POA: Diagnosis not present

## 2015-06-29 DIAGNOSIS — J3089 Other allergic rhinitis: Secondary | ICD-10-CM | POA: Diagnosis not present

## 2015-07-04 DIAGNOSIS — J301 Allergic rhinitis due to pollen: Secondary | ICD-10-CM | POA: Diagnosis not present

## 2015-07-04 DIAGNOSIS — J3081 Allergic rhinitis due to animal (cat) (dog) hair and dander: Secondary | ICD-10-CM | POA: Diagnosis not present

## 2015-07-04 DIAGNOSIS — J3089 Other allergic rhinitis: Secondary | ICD-10-CM | POA: Diagnosis not present

## 2015-07-25 DIAGNOSIS — J3081 Allergic rhinitis due to animal (cat) (dog) hair and dander: Secondary | ICD-10-CM | POA: Diagnosis not present

## 2015-07-25 DIAGNOSIS — J3089 Other allergic rhinitis: Secondary | ICD-10-CM | POA: Diagnosis not present

## 2015-07-25 DIAGNOSIS — J301 Allergic rhinitis due to pollen: Secondary | ICD-10-CM | POA: Diagnosis not present

## 2015-08-03 DIAGNOSIS — J301 Allergic rhinitis due to pollen: Secondary | ICD-10-CM | POA: Diagnosis not present

## 2015-08-03 DIAGNOSIS — J3089 Other allergic rhinitis: Secondary | ICD-10-CM | POA: Diagnosis not present

## 2015-08-03 DIAGNOSIS — J3081 Allergic rhinitis due to animal (cat) (dog) hair and dander: Secondary | ICD-10-CM | POA: Diagnosis not present

## 2015-08-08 DIAGNOSIS — J301 Allergic rhinitis due to pollen: Secondary | ICD-10-CM | POA: Diagnosis not present

## 2015-08-08 DIAGNOSIS — J3089 Other allergic rhinitis: Secondary | ICD-10-CM | POA: Diagnosis not present

## 2015-08-08 DIAGNOSIS — J3081 Allergic rhinitis due to animal (cat) (dog) hair and dander: Secondary | ICD-10-CM | POA: Diagnosis not present

## 2015-08-15 DIAGNOSIS — J3089 Other allergic rhinitis: Secondary | ICD-10-CM | POA: Diagnosis not present

## 2015-08-15 DIAGNOSIS — J301 Allergic rhinitis due to pollen: Secondary | ICD-10-CM | POA: Diagnosis not present

## 2015-08-15 DIAGNOSIS — J3081 Allergic rhinitis due to animal (cat) (dog) hair and dander: Secondary | ICD-10-CM | POA: Diagnosis not present

## 2015-08-24 DIAGNOSIS — J301 Allergic rhinitis due to pollen: Secondary | ICD-10-CM | POA: Diagnosis not present

## 2015-08-24 DIAGNOSIS — J3089 Other allergic rhinitis: Secondary | ICD-10-CM | POA: Diagnosis not present

## 2015-08-24 DIAGNOSIS — J3081 Allergic rhinitis due to animal (cat) (dog) hair and dander: Secondary | ICD-10-CM | POA: Diagnosis not present

## 2015-08-31 DIAGNOSIS — J3081 Allergic rhinitis due to animal (cat) (dog) hair and dander: Secondary | ICD-10-CM | POA: Diagnosis not present

## 2015-08-31 DIAGNOSIS — J3089 Other allergic rhinitis: Secondary | ICD-10-CM | POA: Diagnosis not present

## 2015-08-31 DIAGNOSIS — J301 Allergic rhinitis due to pollen: Secondary | ICD-10-CM | POA: Diagnosis not present

## 2015-09-08 DIAGNOSIS — J301 Allergic rhinitis due to pollen: Secondary | ICD-10-CM | POA: Diagnosis not present

## 2015-09-08 DIAGNOSIS — J3081 Allergic rhinitis due to animal (cat) (dog) hair and dander: Secondary | ICD-10-CM | POA: Diagnosis not present

## 2015-09-08 DIAGNOSIS — J3089 Other allergic rhinitis: Secondary | ICD-10-CM | POA: Diagnosis not present

## 2015-09-14 DIAGNOSIS — J3081 Allergic rhinitis due to animal (cat) (dog) hair and dander: Secondary | ICD-10-CM | POA: Diagnosis not present

## 2015-09-14 DIAGNOSIS — J3089 Other allergic rhinitis: Secondary | ICD-10-CM | POA: Diagnosis not present

## 2015-09-14 DIAGNOSIS — J301 Allergic rhinitis due to pollen: Secondary | ICD-10-CM | POA: Diagnosis not present

## 2015-09-22 DIAGNOSIS — J3089 Other allergic rhinitis: Secondary | ICD-10-CM | POA: Diagnosis not present

## 2015-09-22 DIAGNOSIS — J301 Allergic rhinitis due to pollen: Secondary | ICD-10-CM | POA: Diagnosis not present

## 2015-09-22 DIAGNOSIS — J3081 Allergic rhinitis due to animal (cat) (dog) hair and dander: Secondary | ICD-10-CM | POA: Diagnosis not present

## 2015-09-29 DIAGNOSIS — J3089 Other allergic rhinitis: Secondary | ICD-10-CM | POA: Diagnosis not present

## 2015-09-29 DIAGNOSIS — J301 Allergic rhinitis due to pollen: Secondary | ICD-10-CM | POA: Diagnosis not present

## 2015-09-29 DIAGNOSIS — J3081 Allergic rhinitis due to animal (cat) (dog) hair and dander: Secondary | ICD-10-CM | POA: Diagnosis not present

## 2015-10-13 DIAGNOSIS — J301 Allergic rhinitis due to pollen: Secondary | ICD-10-CM | POA: Diagnosis not present

## 2015-10-13 DIAGNOSIS — J3081 Allergic rhinitis due to animal (cat) (dog) hair and dander: Secondary | ICD-10-CM | POA: Diagnosis not present

## 2015-10-13 DIAGNOSIS — J3089 Other allergic rhinitis: Secondary | ICD-10-CM | POA: Diagnosis not present

## 2015-10-20 DIAGNOSIS — J301 Allergic rhinitis due to pollen: Secondary | ICD-10-CM | POA: Diagnosis not present

## 2015-10-20 DIAGNOSIS — J3081 Allergic rhinitis due to animal (cat) (dog) hair and dander: Secondary | ICD-10-CM | POA: Diagnosis not present

## 2015-10-20 DIAGNOSIS — J3089 Other allergic rhinitis: Secondary | ICD-10-CM | POA: Diagnosis not present

## 2015-10-27 DIAGNOSIS — J3089 Other allergic rhinitis: Secondary | ICD-10-CM | POA: Diagnosis not present

## 2015-10-27 DIAGNOSIS — J301 Allergic rhinitis due to pollen: Secondary | ICD-10-CM | POA: Diagnosis not present

## 2015-10-27 DIAGNOSIS — J3081 Allergic rhinitis due to animal (cat) (dog) hair and dander: Secondary | ICD-10-CM | POA: Diagnosis not present

## 2015-11-03 DIAGNOSIS — J3089 Other allergic rhinitis: Secondary | ICD-10-CM | POA: Diagnosis not present

## 2015-11-03 DIAGNOSIS — J3081 Allergic rhinitis due to animal (cat) (dog) hair and dander: Secondary | ICD-10-CM | POA: Diagnosis not present

## 2015-11-03 DIAGNOSIS — J301 Allergic rhinitis due to pollen: Secondary | ICD-10-CM | POA: Diagnosis not present

## 2015-11-13 DIAGNOSIS — J3089 Other allergic rhinitis: Secondary | ICD-10-CM | POA: Diagnosis not present

## 2015-11-13 DIAGNOSIS — J301 Allergic rhinitis due to pollen: Secondary | ICD-10-CM | POA: Diagnosis not present

## 2015-11-13 DIAGNOSIS — J3081 Allergic rhinitis due to animal (cat) (dog) hair and dander: Secondary | ICD-10-CM | POA: Diagnosis not present

## 2015-11-24 DIAGNOSIS — J3081 Allergic rhinitis due to animal (cat) (dog) hair and dander: Secondary | ICD-10-CM | POA: Diagnosis not present

## 2015-11-24 DIAGNOSIS — J301 Allergic rhinitis due to pollen: Secondary | ICD-10-CM | POA: Diagnosis not present

## 2015-11-24 DIAGNOSIS — J3089 Other allergic rhinitis: Secondary | ICD-10-CM | POA: Diagnosis not present

## 2015-12-01 DIAGNOSIS — J3081 Allergic rhinitis due to animal (cat) (dog) hair and dander: Secondary | ICD-10-CM | POA: Diagnosis not present

## 2015-12-01 DIAGNOSIS — J301 Allergic rhinitis due to pollen: Secondary | ICD-10-CM | POA: Diagnosis not present

## 2015-12-01 DIAGNOSIS — J3089 Other allergic rhinitis: Secondary | ICD-10-CM | POA: Diagnosis not present

## 2015-12-15 DIAGNOSIS — J3089 Other allergic rhinitis: Secondary | ICD-10-CM | POA: Diagnosis not present

## 2015-12-15 DIAGNOSIS — J3081 Allergic rhinitis due to animal (cat) (dog) hair and dander: Secondary | ICD-10-CM | POA: Diagnosis not present

## 2015-12-15 DIAGNOSIS — J301 Allergic rhinitis due to pollen: Secondary | ICD-10-CM | POA: Diagnosis not present

## 2015-12-18 DIAGNOSIS — J3089 Other allergic rhinitis: Secondary | ICD-10-CM | POA: Diagnosis not present

## 2015-12-18 DIAGNOSIS — J3081 Allergic rhinitis due to animal (cat) (dog) hair and dander: Secondary | ICD-10-CM | POA: Diagnosis not present

## 2015-12-25 ENCOUNTER — Ambulatory Visit (INDEPENDENT_AMBULATORY_CARE_PROVIDER_SITE_OTHER)
Admission: RE | Admit: 2015-12-25 | Discharge: 2015-12-25 | Disposition: A | Discharge status: Home / Self Care | Payer: BLUE CROSS/BLUE SHIELD | Source: Ambulatory Visit | Attending: Nurse Practitioner | Admitting: Nurse Practitioner

## 2015-12-25 ENCOUNTER — Ambulatory Visit (INDEPENDENT_AMBULATORY_CARE_PROVIDER_SITE_OTHER): Payer: BLUE CROSS/BLUE SHIELD | Admitting: Nurse Practitioner

## 2015-12-25 ENCOUNTER — Encounter: Payer: Self-pay | Admitting: Nurse Practitioner

## 2015-12-25 VITALS — BP 118/80 | HR 103 | Temp 100.1°F | Ht 63.0 in | Wt 166.0 lb

## 2015-12-25 DIAGNOSIS — R6889 Other general symptoms and signs: Secondary | ICD-10-CM

## 2015-12-25 DIAGNOSIS — J9801 Acute bronchospasm: Secondary | ICD-10-CM | POA: Diagnosis not present

## 2015-12-25 DIAGNOSIS — R0602 Shortness of breath: Secondary | ICD-10-CM

## 2015-12-25 DIAGNOSIS — R05 Cough: Secondary | ICD-10-CM | POA: Diagnosis not present

## 2015-12-25 DIAGNOSIS — H6983 Other specified disorders of Eustachian tube, bilateral: Secondary | ICD-10-CM | POA: Diagnosis not present

## 2015-12-25 LAB — POCT INFLUENZA A/B
Influenza A, POC: NEGATIVE
Influenza B, POC: NEGATIVE

## 2015-12-25 MED ORDER — HYDROCODONE-HOMATROPINE 5-1.5 MG/5ML PO SYRP: 5.0000 mL | ORAL_SOLUTION | Freq: Every evening | ORAL | 0 refills | Status: DC | PRN

## 2015-12-25 MED ORDER — CETIRIZINE HCL 10 MG PO TABS: 10.0000 mg | ORAL_TABLET | Freq: Every day | ORAL | 0 refills | Status: DC

## 2015-12-25 MED ORDER — IPRATROPIUM BROMIDE 0.03 % NA SOLN: 2.0000 | Freq: Two times a day (BID) | NASAL | 12 refills | Status: DC

## 2015-12-25 MED ORDER — OSELTAMIVIR PHOSPHATE 75 MG PO CAPS: 75.0000 mg | ORAL_CAPSULE | Freq: Two times a day (BID) | ORAL | 0 refills | Status: DC

## 2015-12-25 MED ORDER — DM-GUAIFENESIN ER 30-600 MG PO TB12: 1.0000 | ORAL_TABLET | Freq: Two times a day (BID) | ORAL | 0 refills | Status: DC | PRN

## 2015-12-25 NOTE — Progress Notes (Signed)
Reviewed with patient in office;

## 2015-12-25 NOTE — Progress Notes (Signed)
Pre visit review using our clinic review tool, if applicable. No additional management support is needed unless otherwise documented below in the visit note. 

## 2015-12-25 NOTE — Patient Instructions (Addendum)
Normal CXR. Return to office if no improvement in 1week.  Influenza, Adult Influenza ("the flu") is an infection in the lungs, nose, and throat (respiratory tract). It is caused by a virus. The flu causes many common cold symptoms, as well as a high fever and body aches. It can make you feel very sick. The flu spreads easily from person to person (is contagious). Getting a flu shot (influenza vaccination) every year is the best way to prevent the flu. Follow these instructions at home:  Take over-the-counter and prescription medicines only as told by your doctor.  Use a cool mist humidifier to add moisture (humidity) to the air in your home. This can make it easier to breathe.  Rest as needed.  Drink enough fluid to keep your pee (urine) clear or pale yellow.  Cover your mouth and nose when you cough or sneeze.  Wash your hands with soap and water often, especially after you cough or sneeze. If you cannot use soap and water, use hand sanitizer.  Stay home from work or school as told by your doctor. Unless you are visiting your doctor, try to avoid leaving home until your fever has been gone for 24 hours without the use of medicine.  Keep all follow-up visits as told by your doctor. This is important. How is this prevented?  Getting a yearly (annual) flu shot is the best way to avoid getting the flu. You may get the flu shot in late summer, fall, or winter. Ask your doctor when you should get your flu shot.  Wash your hands often or use hand sanitizer often.  Avoid contact with people who are sick during cold and flu season.  Eat healthy foods.  Drink plenty of fluids.  Get enough sleep.  Exercise regularly. Contact a doctor if:  You get new symptoms.  You have:  Chest pain.  Watery poop (diarrhea).  A fever.  Your cough gets worse.  You start to have more mucus.  You feel sick to your stomach (nauseous).  You throw up (vomit). Get help right away if:  You  start to be short of breath or have trouble breathing.  Your skin or nails turn a bluish color.  You have very bad pain or stiffness in your neck.  You get a sudden headache.  You get sudden pain in your face or ear.  You cannot stop throwing up. This information is not intended to replace advice given to you by your health care provider. Make sure you discuss any questions you have with your health care provider. Document Released: 10/24/2007 Document Revised: 06/22/2015 Document Reviewed: 11/08/2014 Elsevier Interactive Patient Education  2017 [employer].

## 2015-12-25 NOTE — Progress Notes (Signed)
Subjective:  Patient ID: Shannon [name], female    DOB: 1965/09/16  Age: 50 y.o. MRN: [id number]  CC: Cough (cough yellow mucus,fever,weak,bodyache for 4 days.)   Cough  This is a new problem. The current episode started in the past 7 days. The problem has been gradually worsening. The cough is non-productive. Associated symptoms include chills, ear congestion, a fever, headaches, myalgias, nasal congestion, postnasal drip, rhinorrhea and a sore throat. Pertinent negatives include no chest pain, heartburn, hemoptysis, shortness of breath or wheezing. She has tried OTC cough suppressant for the symptoms. The treatment provided no relief.    Outpatient Medications Prior to Visit  Medication Sig Dispense Refill  . azelastine (ASTELIN) 0.1 % nasal spray Place 1 spray into both nostrils daily. Use in each nostril as directed    . omeprazole-sodium bicarbonate (ZEGERID) 40-1100 MG per capsule Take 1 capsule by mouth daily before breakfast. (Patient not taking: Reported on 12/25/2015) 90 capsule 3  . cefUROXime (CEFTIN) 500 MG tablet Take 1 tablet (500 mg total) by mouth 2 (two) times daily. 14 tablet 0   No facility-administered medications prior to visit.     ROS See HPI  Objective:  BP 118/80   Pulse (!) 103   Temp 100.1 F (37.8 C) (Oral)   Ht 5\' 3"  (1.6 m)   Wt 166 lb (75.3 kg)   SpO2 96%   BMI 29.41 kg/m   BP Readings from Last 3 Encounters:  12/25/15 118/80  04/20/14 124/84  03/30/14 (!) 124/96    Wt Readings from Last 3 Encounters:  12/25/15 166 lb (75.3 kg)  04/20/14 166 lb 8 oz (75.5 kg)  03/30/14 166 lb (75.3 kg)    Physical Exam  Constitutional: She is oriented to person, place, and time.  HENT:  Right Ear: External ear and ear canal normal. A middle ear effusion is present.  Left Ear: External ear and ear canal normal. A middle ear effusion is present.  Nose: Mucosal edema and rhinorrhea present.  Mouth/Throat: Uvula is midline. No oral lesions. No  trismus in the jaw. Posterior oropharyngeal erythema present. No oropharyngeal exudate.  Eyes: No scleral icterus.  Neck: Normal range of motion. Neck supple.  Cardiovascular: Normal heart sounds.   Pulmonary/Chest: Effort normal and breath sounds normal.  Musculoskeletal: She exhibits no edema.  Lymphadenopathy:    She has cervical adenopathy.  Neurological: She is alert and oriented to person, place, and time.  Skin: Skin is warm and dry.  Vitals reviewed.   Lab Results  Component Value Date   WBC 7.7 11/18/2012   HGB 13.0 11/18/2012   HCT 37.2 11/18/2012   PLT 271.0 11/18/2012   GLUCOSE 98 11/18/2012   CHOL 191 11/18/2012   TRIG 84.0 11/18/2012   HDL 51.30 11/18/2012   LDLCALC 123 (H) 11/18/2012   ALT 16 11/18/2012   AST 17 11/18/2012   NA 140 11/18/2012   K 4.1 11/18/2012   CL 106 11/18/2012   CREATININE 0.7 11/18/2012   BUN 15 11/18/2012   CO2 26 11/18/2012   TSH 1.47 11/18/2012   INR 1.2 (H) 11/18/2012   HGBA1C 5.2 12/23/2012    Dg Chest 2 View  Result Date: 05/06/2014 CLINICAL DATA:  Asthma. EXAM: CHEST  2 VIEW COMPARISON:  03/10/2011. FINDINGS: Mediastinum and hilar structures normal. Lungs are clear. Heart size normal. No pleural effusion or pneumothorax. No acute bony abnormality . IMPRESSION: No active cardiopulmonary disease. Electronically Signed   By: Marcello Moores  Register   On:  05/06/2014 12:01    Assessment & Plan:   Rehab was seen today for cough.  Diagnoses and all orders for this visit:  Flu-like symptoms -     POCT Influenza A/B -     oseltamivir (TAMIFLU) 75 MG capsule; Take 1 capsule (75 mg total) by mouth 2 (two) times daily. With food -     dextromethorphan-guaiFENesin (MUCINEX DM) 30-600 MG 12hr tablet; Take 1 tablet by mouth 2 (two) times daily as needed for cough. -     ipratropium (ATROVENT) 0.03 % nasal spray; Place 2 sprays into both nostrils 2 (two) times daily. Do not use for more than 5days. -     HYDROcodone-homatropine (HYCODAN) 5-1.5  MG/5ML syrup; Take 5 mLs by mouth at bedtime as needed for cough. -     DG Chest 2 View; Future -     cetirizine (ZYRTEC) 10 MG tablet; Take 1 tablet (10 mg total) by mouth daily.  Cough due to bronchospasm -     dextromethorphan-guaiFENesin (MUCINEX DM) 30-600 MG 12hr tablet; Take 1 tablet by mouth 2 (two) times daily as needed for cough. -     HYDROcodone-homatropine (HYCODAN) 5-1.5 MG/5ML syrup; Take 5 mLs by mouth at bedtime as needed for cough. -     DG Chest 2 View; Future  Shortness of breath  Eustachian tube dysfunction, bilateral -     cetirizine (ZYRTEC) 10 MG tablet; Take 1 tablet (10 mg total) by mouth daily.   I have discontinued Shannon [name]'s cefUROXime. I am also having her start on oseltamivir, dextromethorphan-guaiFENesin, ipratropium, HYDROcodone-homatropine, and cetirizine. Additionally, I am having her maintain her omeprazole-sodium bicarbonate and azelastine.  Meds ordered this encounter  Medications  . oseltamivir (TAMIFLU) 75 MG capsule    Sig: Take 1 capsule (75 mg total) by mouth 2 (two) times daily. With food    Dispense:  14 capsule    Refill:  0    Order Specific Question:   Supervising Provider    Answer:   Cassandria Anger [[id number]]  . dextromethorphan-guaiFENesin (MUCINEX DM) 30-600 MG 12hr tablet    Sig: Take 1 tablet by mouth 2 (two) times daily as needed for cough.    Dispense:  14 tablet    Refill:  0    Order Specific Question:   Supervising Provider    Answer:   Cassandria Anger [[id number]]  . ipratropium (ATROVENT) 0.03 % nasal spray    Sig: Place 2 sprays into both nostrils 2 (two) times daily. Do not use for more than 5days.    Dispense:  30 mL    Refill:  12    Order Specific Question:   Supervising Provider    Answer:   Cassandria Anger [[id number]]  . HYDROcodone-homatropine (HYCODAN) 5-1.5 MG/5ML syrup    Sig: Take 5 mLs by mouth at bedtime as needed for cough.    Dispense:  120 mL    Refill:  0    Order Specific Question:    Supervising Provider    Answer:   Cassandria Anger [[id number]]  . cetirizine (ZYRTEC) 10 MG tablet    Sig: Take 1 tablet (10 mg total) by mouth daily.    Dispense:  30 tablet    Refill:  0    Order Specific Question:   Supervising Provider    Answer:   Cassandria Anger [[id number]]    Follow-up: Return if symptoms worsen or fail to improve.  Wilfred Lacy, NP

## 2015-12-26 ENCOUNTER — Other Ambulatory Visit: Payer: Self-pay | Admitting: Nurse Practitioner

## 2015-12-26 DIAGNOSIS — J0110 Acute frontal sinusitis, unspecified: Secondary | ICD-10-CM

## 2015-12-26 MED ORDER — AZITHROMYCIN 250 MG PO TABS: 250.0000 mg | ORAL_TABLET | Freq: Every day | ORAL | 0 refills | Status: DC

## 2016-01-11 DIAGNOSIS — J3089 Other allergic rhinitis: Secondary | ICD-10-CM | POA: Diagnosis not present

## 2016-01-11 DIAGNOSIS — J301 Allergic rhinitis due to pollen: Secondary | ICD-10-CM | POA: Diagnosis not present

## 2016-01-11 DIAGNOSIS — J3081 Allergic rhinitis due to animal (cat) (dog) hair and dander: Secondary | ICD-10-CM | POA: Diagnosis not present

## 2016-01-19 DIAGNOSIS — J301 Allergic rhinitis due to pollen: Secondary | ICD-10-CM | POA: Diagnosis not present

## 2016-01-19 DIAGNOSIS — J3081 Allergic rhinitis due to animal (cat) (dog) hair and dander: Secondary | ICD-10-CM | POA: Diagnosis not present

## 2016-01-19 DIAGNOSIS — J3089 Other allergic rhinitis: Secondary | ICD-10-CM | POA: Diagnosis not present

## 2016-01-30 ENCOUNTER — Encounter: Payer: Self-pay | Admitting: Nurse Practitioner

## 2016-01-30 ENCOUNTER — Ambulatory Visit (INDEPENDENT_AMBULATORY_CARE_PROVIDER_SITE_OTHER)
Admission: RE | Admit: 2016-01-30 | Discharge: 2016-01-30 | Disposition: A | Discharge status: Home / Self Care | Payer: BLUE CROSS/BLUE SHIELD | Source: Ambulatory Visit | Attending: Nurse Practitioner | Admitting: Nurse Practitioner

## 2016-01-30 ENCOUNTER — Ambulatory Visit (INDEPENDENT_AMBULATORY_CARE_PROVIDER_SITE_OTHER): Payer: BLUE CROSS/BLUE SHIELD | Admitting: Nurse Practitioner

## 2016-01-30 VITALS — BP 120/82 | HR 110 | Temp 99.9°F | Ht 63.0 in | Wt 167.0 lb

## 2016-01-30 DIAGNOSIS — R509 Fever, unspecified: Secondary | ICD-10-CM | POA: Diagnosis not present

## 2016-01-30 DIAGNOSIS — R05 Cough: Secondary | ICD-10-CM | POA: Diagnosis not present

## 2016-01-30 DIAGNOSIS — J209 Acute bronchitis, unspecified: Secondary | ICD-10-CM

## 2016-01-30 MED ORDER — AMOXICILLIN-POT CLAVULANATE 875-125 MG PO TABS: 1.0000 | ORAL_TABLET | Freq: Two times a day (BID) | ORAL | 0 refills | Status: DC

## 2016-01-30 MED ORDER — GUAIFENESIN ER 600 MG PO TB12: 600.0000 mg | ORAL_TABLET | Freq: Two times a day (BID) | ORAL | 0 refills | Status: DC | PRN

## 2016-01-30 MED ORDER — PROMETHAZINE-DM 6.25-15 MG/5ML PO SYRP: 5.0000 mL | ORAL_SOLUTION | Freq: Three times a day (TID) | ORAL | 0 refills | Status: DC | PRN

## 2016-01-30 MED ORDER — IPRATROPIUM-ALBUTEROL 0.5-2.5 (3) MG/3ML IN SOLN
3.0000 mL | Freq: Once | RESPIRATORY_TRACT | Status: AC
  Administered 2016-01-30: 3 mL via RESPIRATORY_TRACT

## 2016-01-30 MED ORDER — METHYLPREDNISOLONE ACETATE 40 MG/ML IJ SUSP
40.0000 mg | Freq: Once | INTRAMUSCULAR | Status: AC
  Administered 2016-01-30: 40 mg via INTRAMUSCULAR

## 2016-01-30 NOTE — Progress Notes (Signed)
Subjective:  Patient ID: Shannon [name], female    DOB: 1965/12/07  Age: 51 y.o. MRN: [id number]  CC: Cough (coughing/fever 2 days/ dizzy/weak/congestion.)   Cough  This is a new problem. The current episode started 1 to 4 weeks ago. The problem has been unchanged. The cough is non-productive. Associated symptoms include chills, a fever, headaches, nasal congestion, postnasal drip, rhinorrhea, a sore throat, shortness of breath and wheezing. Pertinent negatives include no chest pain. The symptoms are aggravated by lying down and cold air. She has tried a beta-agonist inhaler (azithromycin, delsym, and hycodan) for the symptoms. Her past medical history is significant for bronchitis.    Outpatient Medications Prior to Visit  Medication Sig Dispense Refill  . cetirizine (ZYRTEC) 10 MG tablet Take 1 tablet (10 mg total) by mouth daily. 30 tablet 0  . dextromethorphan-guaiFENesin (MUCINEX DM) 30-600 MG 12hr tablet Take 1 tablet by mouth 2 (two) times daily as needed for cough. 14 tablet 0  . HYDROcodone-homatropine (HYCODAN) 5-1.5 MG/5ML syrup Take 5 mLs by mouth at bedtime as needed for cough. 120 mL 0  . azelastine (ASTELIN) 0.1 % nasal spray Place 1 spray into both nostrils daily. Use in each nostril as directed    . ipratropium (ATROVENT) 0.03 % nasal spray Place 2 sprays into both nostrils 2 (two) times daily. Do not use for more than 5days. (Patient not taking: Reported on 01/30/2016) 30 mL 12  . omeprazole-sodium bicarbonate (ZEGERID) 40-1100 MG per capsule Take 1 capsule by mouth daily before breakfast. (Patient not taking: Reported on 01/30/2016) 90 capsule 3  . azithromycin (ZITHROMAX Z-PAK) 250 MG tablet Take 1 tablet (250 mg total) by mouth daily. Take 2tabs on first day, then 1tab once a day till complete (Patient not taking: Reported on 01/30/2016) 6 tablet 0  . oseltamivir (TAMIFLU) 75 MG capsule Take 1 capsule (75 mg total) by mouth 2 (two) times daily. With food (Patient not taking:  Reported on 01/30/2016) 14 capsule 0   No facility-administered medications prior to visit.     ROS See HPI  Objective:  BP 120/82   Pulse (!) 110   Temp 99.9 F (37.7 C)   Ht 5\' 3"  (1.6 m)   Wt 167 lb (75.8 kg)   SpO2 96%   BMI 29.58 kg/m   BP Readings from Last 3 Encounters:  01/30/16 120/82  12/25/15 118/80  04/20/14 124/84    Wt Readings from Last 3 Encounters:  01/30/16 167 lb (75.8 kg)  12/25/15 166 lb (75.3 kg)  04/20/14 166 lb 8 oz (75.5 kg)    Physical Exam  Constitutional: She is oriented to person, place, and time. No distress.  Pulmonary/Chest: Effort normal. She has wheezes.  Musculoskeletal: She exhibits no edema.  Lymphadenopathy:    She has no cervical adenopathy.  Neurological: She is alert and oriented to person, place, and time.  Skin: Skin is warm and dry.  Vitals reviewed.   Lab Results  Component Value Date   WBC 7.7 11/18/2012   HGB 13.0 11/18/2012   HCT 37.2 11/18/2012   PLT 271.0 11/18/2012   GLUCOSE 98 11/18/2012   CHOL 191 11/18/2012   TRIG 84.0 11/18/2012   HDL 51.30 11/18/2012   LDLCALC 123 (H) 11/18/2012   ALT 16 11/18/2012   AST 17 11/18/2012   NA 140 11/18/2012   K 4.1 11/18/2012   CL 106 11/18/2012   CREATININE 0.7 11/18/2012   BUN 15 11/18/2012   CO2 26 11/18/2012  TSH 1.47 11/18/2012   INR 1.2 (H) 11/18/2012   HGBA1C 5.2 12/23/2012    Dg Chest 2 View  Result Date: 12/25/2015 CLINICAL DATA:  Cough, fever, and shortness breath for 4 days. EXAM: CHEST  2 VIEW COMPARISON:  05/06/2014 FINDINGS: The heart size and mediastinal contours are within normal limits. Both lungs are clear. The visualized skeletal structures are unremarkable. IMPRESSION: No active cardiopulmonary disease. Electronically Signed   By: Earle Gell M.D.   On: 12/25/2015 12:28    Assessment & Plan:   Parnell was seen today for cough.  Diagnoses and all orders for this visit:  Acute bronchitis, unspecified organism -     ipratropium-albuterol  (DUONEB) 0.5-2.5 (3) MG/3ML nebulizer solution 3 mL; Take 3 mLs by nebulization once. -     promethazine-dextromethorphan (PROMETHAZINE-DM) 6.25-15 MG/5ML syrup; Take 5 mLs by mouth 3 (three) times daily as needed for cough. -     methylPREDNISolone acetate (DEPO-MEDROL) injection 40 mg; Inject 1 mL (40 mg total) into the muscle once. -     amoxicillin-clavulanate (AUGMENTIN) 875-125 MG tablet; Take 1 tablet by mouth 2 (two) times daily. -     guaiFENesin (MUCINEX) 600 MG 12 hr tablet; Take 1 tablet (600 mg total) by mouth 2 (two) times daily as needed for cough or to loosen phlegm. -     DG Chest 2 View; Future  Fever and chills -     ipratropium-albuterol (DUONEB) 0.5-2.5 (3) MG/3ML nebulizer solution 3 mL; Take 3 mLs by nebulization once. -     promethazine-dextromethorphan (PROMETHAZINE-DM) 6.25-15 MG/5ML syrup; Take 5 mLs by mouth 3 (three) times daily as needed for cough. -     methylPREDNISolone acetate (DEPO-MEDROL) injection 40 mg; Inject 1 mL (40 mg total) into the muscle once. -     amoxicillin-clavulanate (AUGMENTIN) 875-125 MG tablet; Take 1 tablet by mouth 2 (two) times daily. -     guaiFENesin (MUCINEX) 600 MG 12 hr tablet; Take 1 tablet (600 mg total) by mouth 2 (two) times daily as needed for cough or to loosen phlegm. -     DG Chest 2 View; Future   I have discontinued Ms. Schnetzer's oseltamivir, HYDROcodone-homatropine, and azithromycin. I am also having her start on promethazine-dextromethorphan, amoxicillin-clavulanate, and guaiFENesin. Additionally, I am having her maintain her omeprazole-sodium bicarbonate, azelastine, dextromethorphan-guaiFENesin, ipratropium, and cetirizine. We will continue to administer ipratropium-albuterol and methylPREDNISolone acetate.  Meds ordered this encounter  Medications  . ipratropium-albuterol (DUONEB) 0.5-2.5 (3) MG/3ML nebulizer solution 3 mL  . promethazine-dextromethorphan (PROMETHAZINE-DM) 6.25-15 MG/5ML syrup    Sig: Take 5 mLs by  mouth 3 (three) times daily as needed for cough.    Dispense:  240 mL    Refill:  0    Order Specific Question:   Supervising Provider    Answer:   Cassandria Anger [[id number]]  . methylPREDNISolone acetate (DEPO-MEDROL) injection 40 mg  . amoxicillin-clavulanate (AUGMENTIN) 875-125 MG tablet    Sig: Take 1 tablet by mouth 2 (two) times daily.    Dispense:  20 tablet    Refill:  0    Order Specific Question:   Supervising Provider    Answer:   Cassandria Anger [[id number]]  . guaiFENesin (MUCINEX) 600 MG 12 hr tablet    Sig: Take 1 tablet (600 mg total) by mouth 2 (two) times daily as needed for cough or to loosen phlegm.    Dispense:  14 tablet    Refill:  0    Order Specific Question:  Supervising Provider    Answer:   Cassandria Anger [[id number]]    Follow-up: Return if symptoms worsen or fail to improve.  Wilfred Lacy, NP

## 2016-01-30 NOTE — Patient Instructions (Signed)
Push oral hydration.  You will be called with CXR results.  Use albuterol and new medications as prescribed.

## 2016-01-30 NOTE — Progress Notes (Signed)
Pre visit review using our clinic review tool, if applicable. No additional management support is needed unless otherwise documented below in the visit note. 

## 2016-02-01 NOTE — Progress Notes (Signed)
Normal CXR

## 2016-02-13 DIAGNOSIS — J301 Allergic rhinitis due to pollen: Secondary | ICD-10-CM | POA: Diagnosis not present

## 2016-02-13 DIAGNOSIS — J3089 Other allergic rhinitis: Secondary | ICD-10-CM | POA: Diagnosis not present

## 2016-02-13 DIAGNOSIS — J3081 Allergic rhinitis due to animal (cat) (dog) hair and dander: Secondary | ICD-10-CM | POA: Diagnosis not present

## 2016-02-19 ENCOUNTER — Encounter: Payer: Self-pay | Admitting: Internal Medicine

## 2016-02-19 ENCOUNTER — Other Ambulatory Visit (INDEPENDENT_AMBULATORY_CARE_PROVIDER_SITE_OTHER): Payer: BLUE CROSS/BLUE SHIELD

## 2016-02-19 ENCOUNTER — Ambulatory Visit (INDEPENDENT_AMBULATORY_CARE_PROVIDER_SITE_OTHER): Payer: BLUE CROSS/BLUE SHIELD | Admitting: Internal Medicine

## 2016-02-19 VITALS — BP 120/80 | HR 86 | Ht 63.0 in | Wt 166.0 lb

## 2016-02-19 DIAGNOSIS — J4521 Mild intermittent asthma with (acute) exacerbation: Secondary | ICD-10-CM | POA: Diagnosis not present

## 2016-02-19 DIAGNOSIS — Z0000 Encounter for general adult medical examination without abnormal findings: Secondary | ICD-10-CM | POA: Diagnosis not present

## 2016-02-19 DIAGNOSIS — K219 Gastro-esophageal reflux disease without esophagitis: Secondary | ICD-10-CM

## 2016-02-19 DIAGNOSIS — Z23 Encounter for immunization: Secondary | ICD-10-CM | POA: Diagnosis not present

## 2016-02-19 DIAGNOSIS — J309 Allergic rhinitis, unspecified: Secondary | ICD-10-CM | POA: Diagnosis not present

## 2016-02-19 LAB — BASIC METABOLIC PANEL
BUN: 12 mg/dL (ref 6–23)
CALCIUM: 9.7 mg/dL (ref 8.4–10.5)
CHLORIDE: 103 meq/L (ref 96–112)
CO2: 26 meq/L (ref 19–32)
CREATININE: 0.66 mg/dL (ref 0.40–1.20)
GFR: 101.25 mL/min (ref >60.00)
Glucose, Bld: 93 mg/dL (ref 70–99)
Potassium: 4 mEq/L (ref 3.5–5.1)
Sodium: 138 mEq/L (ref 135–145)

## 2016-02-19 LAB — URINALYSIS
Bilirubin Urine: NEGATIVE
KETONES UR: NEGATIVE
Leukocytes, UA: NEGATIVE
Nitrite: NEGATIVE
Total Protein, Urine: NEGATIVE
URINE GLUCOSE: NEGATIVE
Urobilinogen, UA: 0.2 (ref 0.0–1.0)
pH: 5.5 (ref 5.0–8.0)

## 2016-02-19 LAB — CBC WITH DIFFERENTIAL/PLATELET
BASOS ABS: 0 10*3/uL (ref 0.0–0.1)
Basophils Relative: 0.4 % (ref 0.0–3.0)
EOS ABS: 0.2 10*3/uL (ref 0.0–0.7)
Eosinophils Relative: 2.2 % (ref 0.0–5.0)
HEMATOCRIT: 37.9 % (ref 36.0–46.0)
HEMOGLOBIN: 13.2 g/dL (ref 12.0–15.0)
LYMPHS PCT: 29.3 % (ref 12.0–46.0)
Lymphs Abs: 2.8 10*3/uL (ref 0.7–4.0)
MCHC: 34.8 g/dL (ref 30.0–36.0)
MCV: 91.7 fl (ref 78.0–100.0)
MONOS PCT: 7.7 % (ref 3.0–12.0)
Monocytes Absolute: 0.7 10*3/uL (ref 0.1–1.0)
Neutro Abs: 5.8 10*3/uL (ref 1.4–7.7)
Neutrophils Relative %: 60.4 % (ref 43.0–77.0)
Platelets: 328 10*3/uL (ref 150.0–400.0)
RBC: 4.13 Mil/uL (ref 3.87–5.11)
RDW: 12.1 % (ref 11.5–15.5)
WBC: 9.5 10*3/uL (ref 4.0–10.5)

## 2016-02-19 LAB — LIPID PANEL
CHOL/HDL RATIO: 4
Cholesterol: 236 mg/dL — ABNORMAL HIGH (ref 0–200)
HDL: 62.2 mg/dL (ref >39.00)
LDL CALC: 148 mg/dL — AB (ref 0–99)
NonHDL: 173.76
TRIGLYCERIDES: 130 mg/dL (ref 0.0–149.0)
VLDL: 26 mg/dL (ref 0.0–40.0)

## 2016-02-19 LAB — VITAMIN B12: VITAMIN B 12: 545 pg/mL (ref 211–911)

## 2016-02-19 LAB — HEPATIC FUNCTION PANEL
ALBUMIN: 4.5 g/dL (ref 3.5–5.2)
ALK PHOS: 58 U/L (ref 39–117)
ALT: 16 U/L (ref 0–35)
AST: 12 U/L (ref 0–37)
Bilirubin, Direct: 0.1 mg/dL (ref 0.0–0.3)
TOTAL PROTEIN: 7.6 g/dL (ref 6.0–8.3)
Total Bilirubin: 0.4 mg/dL (ref 0.2–1.2)

## 2016-02-19 LAB — TSH: TSH: 1.37 u[IU]/mL (ref 0.35–4.50)

## 2016-02-19 MED ORDER — MONTELUKAST SODIUM 10 MG PO TABS: 10.0000 mg | ORAL_TABLET | Freq: Every day | ORAL | 3 refills | Status: DC

## 2016-02-19 MED ORDER — RANITIDINE HCL 300 MG PO TABS: 300.0000 mg | ORAL_TABLET | Freq: Every day | ORAL | 5 refills | Status: DC

## 2016-02-19 MED ORDER — ALBUTEROL SULFATE HFA 108 (90 BASE) MCG/ACT IN AERS: 2.0000 | INHALATION_SPRAY | RESPIRATORY_TRACT | 5 refills | Status: DC | PRN

## 2016-02-19 MED ORDER — LORATADINE 10 MG PO TABS: 10.0000 mg | ORAL_TABLET | Freq: Every day | ORAL | 3 refills | Status: DC

## 2016-02-19 MED ORDER — VITAMIN D 1000 UNITS PO TABS: 1000.0000 [IU] | ORAL_TABLET | Freq: Every day | ORAL | 3 refills | Status: AC

## 2016-02-19 NOTE — Assessment & Plan Note (Signed)
Worse. Start Singulair

## 2016-02-19 NOTE — Progress Notes (Signed)
Subjective:  Patient ID: Shannon [name], female    DOB: 02-11-1965  Age: 51 y.o. MRN: [id number]  CC: Annual Exam   HPI Lastarr Dazey presents for a well exam    Outpatient Medications Prior to Visit  Medication Sig Dispense Refill  . azelastine (ASTELIN) 0.1 % nasal spray Place 1 spray into both nostrils daily. Use in each nostril as directed    . cetirizine (ZYRTEC) 10 MG tablet Take 1 tablet (10 mg total) by mouth daily. 30 tablet 0  . promethazine-dextromethorphan (PROMETHAZINE-DM) 6.25-15 MG/5ML syrup Take 5 mLs by mouth 3 (three) times daily as needed for cough. 240 mL 0  . amoxicillin-clavulanate (AUGMENTIN) 875-125 MG tablet Take 1 tablet by mouth 2 (two) times daily. 20 tablet 0  . dextromethorphan-guaiFENesin (MUCINEX DM) 30-600 MG 12hr tablet Take 1 tablet by mouth 2 (two) times daily as needed for cough. (Patient not taking: Reported on 02/19/2016) 14 tablet 0  . guaiFENesin (MUCINEX) 600 MG 12 hr tablet Take 1 tablet (600 mg total) by mouth 2 (two) times daily as needed for cough or to loosen phlegm. (Patient not taking: Reported on 02/19/2016) 14 tablet 0  . ipratropium (ATROVENT) 0.03 % nasal spray Place 2 sprays into both nostrils 2 (two) times daily. Do not use for more than 5days. (Patient not taking: Reported on 02/19/2016) 30 mL 12  . omeprazole-sodium bicarbonate (ZEGERID) 40-1100 MG per capsule Take 1 capsule by mouth daily before breakfast. (Patient not taking: Reported on 02/19/2016) 90 capsule 3   No facility-administered medications prior to visit.     ROS Review of Systems  Constitutional: Negative for activity change, appetite change, chills, fatigue and unexpected weight change.  HENT: Negative for congestion, mouth sores and sinus pressure.   Eyes: Negative for visual disturbance.  Respiratory: Positive for cough. Negative for chest tightness.   Gastrointestinal: Negative for abdominal pain and nausea.  Genitourinary: Negative for difficulty  urinating, frequency and vaginal pain.  Musculoskeletal: Negative for back pain and gait problem.  Skin: Negative for pallor and rash.  Neurological: Negative for dizziness, tremors, weakness, numbness and headaches.  Psychiatric/Behavioral: Negative for confusion, sleep disturbance and suicidal ideas.    Objective:  BP 120/80   Pulse 86   Ht 5\' 3"  (1.6 m)   Wt 166 lb (75.3 kg)   SpO2 97%   BMI 29.41 kg/m   BP Readings from Last 3 Encounters:  02/19/16 120/80  01/30/16 120/82  12/25/15 118/80    Wt Readings from Last 3 Encounters:  02/19/16 166 lb (75.3 kg)  01/30/16 167 lb (75.8 kg)  12/25/15 166 lb (75.3 kg)    Physical Exam  Constitutional: She appears well-developed. No distress.  HENT:  Head: Normocephalic.  Right Ear: External ear normal.  Left Ear: External ear normal.  Nose: Nose normal.  Mouth/Throat: Oropharynx is clear and moist.  Eyes: Conjunctivae are normal. Pupils are equal, round, and reactive to light. Right eye exhibits no discharge. Left eye exhibits no discharge.  Neck: Normal range of motion. Neck supple. No JVD present. No tracheal deviation present. No thyromegaly present.  Cardiovascular: Normal rate, regular rhythm and normal heart sounds.   Pulmonary/Chest: No stridor. No respiratory distress. She has no wheezes.  Abdominal: Soft. Bowel sounds are normal. She exhibits no distension and no mass. There is no tenderness. There is no rebound and no guarding.  Musculoskeletal: She exhibits no edema or tenderness.  Lymphadenopathy:    She has no cervical adenopathy.  Neurological: She  displays normal reflexes. No cranial nerve deficit. She exhibits normal muscle tone. Coordination normal.  Skin: No rash noted. No erythema.  Psychiatric: She has a normal mood and affect. Her behavior is normal. Judgment and thought content normal.    Lab Results  Component Value Date   WBC 7.7 11/18/2012   HGB 13.0 11/18/2012   HCT 37.2 11/18/2012   PLT 271.0  11/18/2012   GLUCOSE 98 11/18/2012   CHOL 191 11/18/2012   TRIG 84.0 11/18/2012   HDL 51.30 11/18/2012   LDLCALC 123 (H) 11/18/2012   ALT 16 11/18/2012   AST 17 11/18/2012   NA 140 11/18/2012   K 4.1 11/18/2012   CL 106 11/18/2012   CREATININE 0.7 11/18/2012   BUN 15 11/18/2012   CO2 26 11/18/2012   TSH 1.47 11/18/2012   INR 1.2 (H) 11/18/2012   HGBA1C 5.2 12/23/2012    Dg Chest 2 View  Result Date: 01/30/2016 CLINICAL DATA:  Cough and congestion for 5 weeks.  Headaches chills EXAM: CHEST  2 VIEW COMPARISON:  12/25/2015 FINDINGS: The heart size and mediastinal contours are within normal limits. Both lungs are clear. The visualized skeletal structures are unremarkable. IMPRESSION: No active cardiopulmonary disease. Electronically Signed   By: Kerby Moors M.D.   On: 01/30/2016 17:32    Assessment & Plan:   There are no diagnoses linked to this encounter. I have discontinued Ms. Minnis's omeprazole-sodium bicarbonate and amoxicillin-clavulanate. I am also having her maintain her azelastine, dextromethorphan-guaiFENesin, ipratropium, cetirizine, promethazine-dextromethorphan, and guaiFENesin.  No orders of the defined types were placed in this encounter.    Follow-up: No Follow-up on file.  Walker Kehr, MD

## 2016-02-19 NOTE — Assessment & Plan Note (Signed)
Wt Readings from Last 3 Encounters:  02/19/16 166 lb (75.3 kg)  01/30/16 167 lb (75.8 kg)  12/25/15 166 lb (75.3 kg)

## 2016-02-19 NOTE — Assessment & Plan Note (Signed)
Protonix.  ?

## 2016-02-19 NOTE — Progress Notes (Signed)
Pre visit review using our clinic review tool, if applicable. No additional management support is needed unless otherwise documented below in the visit note. 

## 2016-02-22 DIAGNOSIS — J3089 Other allergic rhinitis: Secondary | ICD-10-CM | POA: Diagnosis not present

## 2016-02-22 DIAGNOSIS — J3081 Allergic rhinitis due to animal (cat) (dog) hair and dander: Secondary | ICD-10-CM | POA: Diagnosis not present

## 2016-02-22 DIAGNOSIS — J301 Allergic rhinitis due to pollen: Secondary | ICD-10-CM | POA: Diagnosis not present

## 2016-03-01 DIAGNOSIS — J3081 Allergic rhinitis due to animal (cat) (dog) hair and dander: Secondary | ICD-10-CM | POA: Diagnosis not present

## 2016-03-01 DIAGNOSIS — J3089 Other allergic rhinitis: Secondary | ICD-10-CM | POA: Diagnosis not present

## 2016-03-01 DIAGNOSIS — J301 Allergic rhinitis due to pollen: Secondary | ICD-10-CM | POA: Diagnosis not present

## 2016-03-07 DIAGNOSIS — J301 Allergic rhinitis due to pollen: Secondary | ICD-10-CM | POA: Diagnosis not present

## 2016-03-08 DIAGNOSIS — J3089 Other allergic rhinitis: Secondary | ICD-10-CM | POA: Diagnosis not present

## 2016-03-08 DIAGNOSIS — J3081 Allergic rhinitis due to animal (cat) (dog) hair and dander: Secondary | ICD-10-CM | POA: Diagnosis not present

## 2016-03-08 DIAGNOSIS — J301 Allergic rhinitis due to pollen: Secondary | ICD-10-CM | POA: Diagnosis not present

## 2016-03-22 DIAGNOSIS — J301 Allergic rhinitis due to pollen: Secondary | ICD-10-CM | POA: Diagnosis not present

## 2016-03-22 DIAGNOSIS — J3089 Other allergic rhinitis: Secondary | ICD-10-CM | POA: Diagnosis not present

## 2016-03-22 DIAGNOSIS — J3081 Allergic rhinitis due to animal (cat) (dog) hair and dander: Secondary | ICD-10-CM | POA: Diagnosis not present

## 2016-04-05 DIAGNOSIS — J301 Allergic rhinitis due to pollen: Secondary | ICD-10-CM | POA: Diagnosis not present

## 2016-04-05 DIAGNOSIS — J3089 Other allergic rhinitis: Secondary | ICD-10-CM | POA: Diagnosis not present

## 2016-04-05 DIAGNOSIS — J3081 Allergic rhinitis due to animal (cat) (dog) hair and dander: Secondary | ICD-10-CM | POA: Diagnosis not present

## 2016-04-15 DIAGNOSIS — J301 Allergic rhinitis due to pollen: Secondary | ICD-10-CM | POA: Diagnosis not present

## 2016-04-15 DIAGNOSIS — J3081 Allergic rhinitis due to animal (cat) (dog) hair and dander: Secondary | ICD-10-CM | POA: Diagnosis not present

## 2016-04-15 DIAGNOSIS — J3089 Other allergic rhinitis: Secondary | ICD-10-CM | POA: Diagnosis not present

## 2016-04-15 DIAGNOSIS — H1045 Other chronic allergic conjunctivitis: Secondary | ICD-10-CM | POA: Diagnosis not present

## 2016-04-15 DIAGNOSIS — J4530 Mild persistent asthma, uncomplicated: Secondary | ICD-10-CM | POA: Diagnosis not present

## 2016-04-29 DIAGNOSIS — J301 Allergic rhinitis due to pollen: Secondary | ICD-10-CM | POA: Diagnosis not present

## 2016-04-29 DIAGNOSIS — J3081 Allergic rhinitis due to animal (cat) (dog) hair and dander: Secondary | ICD-10-CM | POA: Diagnosis not present

## 2016-04-29 DIAGNOSIS — J3089 Other allergic rhinitis: Secondary | ICD-10-CM | POA: Diagnosis not present

## 2016-05-03 DIAGNOSIS — J301 Allergic rhinitis due to pollen: Secondary | ICD-10-CM | POA: Diagnosis not present

## 2016-05-03 DIAGNOSIS — J3081 Allergic rhinitis due to animal (cat) (dog) hair and dander: Secondary | ICD-10-CM | POA: Diagnosis not present

## 2016-05-03 DIAGNOSIS — J3089 Other allergic rhinitis: Secondary | ICD-10-CM | POA: Diagnosis not present

## 2016-05-10 DIAGNOSIS — J301 Allergic rhinitis due to pollen: Secondary | ICD-10-CM | POA: Diagnosis not present

## 2016-05-10 DIAGNOSIS — J3081 Allergic rhinitis due to animal (cat) (dog) hair and dander: Secondary | ICD-10-CM | POA: Diagnosis not present

## 2016-05-10 DIAGNOSIS — J3089 Other allergic rhinitis: Secondary | ICD-10-CM | POA: Diagnosis not present

## 2016-05-17 DIAGNOSIS — J301 Allergic rhinitis due to pollen: Secondary | ICD-10-CM | POA: Diagnosis not present

## 2016-05-17 DIAGNOSIS — J3081 Allergic rhinitis due to animal (cat) (dog) hair and dander: Secondary | ICD-10-CM | POA: Diagnosis not present

## 2016-05-17 DIAGNOSIS — J3089 Other allergic rhinitis: Secondary | ICD-10-CM | POA: Diagnosis not present

## 2016-05-24 DIAGNOSIS — J3089 Other allergic rhinitis: Secondary | ICD-10-CM | POA: Diagnosis not present

## 2016-05-24 DIAGNOSIS — J301 Allergic rhinitis due to pollen: Secondary | ICD-10-CM | POA: Diagnosis not present

## 2016-05-24 DIAGNOSIS — J3081 Allergic rhinitis due to animal (cat) (dog) hair and dander: Secondary | ICD-10-CM | POA: Diagnosis not present

## 2016-05-31 DIAGNOSIS — J3081 Allergic rhinitis due to animal (cat) (dog) hair and dander: Secondary | ICD-10-CM | POA: Diagnosis not present

## 2016-05-31 DIAGNOSIS — J3089 Other allergic rhinitis: Secondary | ICD-10-CM | POA: Diagnosis not present

## 2016-05-31 DIAGNOSIS — J301 Allergic rhinitis due to pollen: Secondary | ICD-10-CM | POA: Diagnosis not present

## 2016-06-07 DIAGNOSIS — J3081 Allergic rhinitis due to animal (cat) (dog) hair and dander: Secondary | ICD-10-CM | POA: Diagnosis not present

## 2016-06-07 DIAGNOSIS — J301 Allergic rhinitis due to pollen: Secondary | ICD-10-CM | POA: Diagnosis not present

## 2016-06-07 DIAGNOSIS — J3089 Other allergic rhinitis: Secondary | ICD-10-CM | POA: Diagnosis not present

## 2016-06-18 DIAGNOSIS — J3089 Other allergic rhinitis: Secondary | ICD-10-CM | POA: Diagnosis not present

## 2016-06-18 DIAGNOSIS — J301 Allergic rhinitis due to pollen: Secondary | ICD-10-CM | POA: Diagnosis not present

## 2016-06-18 DIAGNOSIS — J3081 Allergic rhinitis due to animal (cat) (dog) hair and dander: Secondary | ICD-10-CM | POA: Diagnosis not present

## 2016-07-29 DIAGNOSIS — J3081 Allergic rhinitis due to animal (cat) (dog) hair and dander: Secondary | ICD-10-CM | POA: Diagnosis not present

## 2016-07-29 DIAGNOSIS — J3089 Other allergic rhinitis: Secondary | ICD-10-CM | POA: Diagnosis not present

## 2016-07-29 DIAGNOSIS — J301 Allergic rhinitis due to pollen: Secondary | ICD-10-CM | POA: Diagnosis not present

## 2016-07-30 DIAGNOSIS — Z6830 Body mass index (BMI) 30.0-30.9, adult: Secondary | ICD-10-CM | POA: Diagnosis not present

## 2016-07-30 DIAGNOSIS — Z1231 Encounter for screening mammogram for malignant neoplasm of breast: Secondary | ICD-10-CM | POA: Diagnosis not present

## 2016-07-30 DIAGNOSIS — Z01419 Encounter for gynecological examination (general) (routine) without abnormal findings: Secondary | ICD-10-CM | POA: Diagnosis not present

## 2016-08-05 DIAGNOSIS — J3081 Allergic rhinitis due to animal (cat) (dog) hair and dander: Secondary | ICD-10-CM | POA: Diagnosis not present

## 2016-08-05 DIAGNOSIS — J301 Allergic rhinitis due to pollen: Secondary | ICD-10-CM | POA: Diagnosis not present

## 2016-08-05 DIAGNOSIS — J3089 Other allergic rhinitis: Secondary | ICD-10-CM | POA: Diagnosis not present

## 2016-08-06 DIAGNOSIS — D2261 Melanocytic nevi of right upper limb, including shoulder: Secondary | ICD-10-CM | POA: Diagnosis not present

## 2016-08-06 DIAGNOSIS — L570 Actinic keratosis: Secondary | ICD-10-CM | POA: Diagnosis not present

## 2016-08-06 DIAGNOSIS — D2221 Melanocytic nevi of right ear and external auricular canal: Secondary | ICD-10-CM | POA: Diagnosis not present

## 2016-08-06 DIAGNOSIS — L821 Other seborrheic keratosis: Secondary | ICD-10-CM | POA: Diagnosis not present

## 2016-08-06 DIAGNOSIS — D2272 Melanocytic nevi of left lower limb, including hip: Secondary | ICD-10-CM | POA: Diagnosis not present

## 2016-08-13 DIAGNOSIS — J3081 Allergic rhinitis due to animal (cat) (dog) hair and dander: Secondary | ICD-10-CM | POA: Diagnosis not present

## 2016-08-13 DIAGNOSIS — J3089 Other allergic rhinitis: Secondary | ICD-10-CM | POA: Diagnosis not present

## 2016-08-13 DIAGNOSIS — J301 Allergic rhinitis due to pollen: Secondary | ICD-10-CM | POA: Diagnosis not present

## 2016-08-23 DIAGNOSIS — J3089 Other allergic rhinitis: Secondary | ICD-10-CM | POA: Diagnosis not present

## 2016-08-23 DIAGNOSIS — J3081 Allergic rhinitis due to animal (cat) (dog) hair and dander: Secondary | ICD-10-CM | POA: Diagnosis not present

## 2016-08-23 DIAGNOSIS — J301 Allergic rhinitis due to pollen: Secondary | ICD-10-CM | POA: Diagnosis not present

## 2016-09-02 DIAGNOSIS — J301 Allergic rhinitis due to pollen: Secondary | ICD-10-CM | POA: Diagnosis not present

## 2016-09-02 DIAGNOSIS — J3081 Allergic rhinitis due to animal (cat) (dog) hair and dander: Secondary | ICD-10-CM | POA: Diagnosis not present

## 2016-09-02 DIAGNOSIS — J3089 Other allergic rhinitis: Secondary | ICD-10-CM | POA: Diagnosis not present

## 2016-09-12 DIAGNOSIS — J301 Allergic rhinitis due to pollen: Secondary | ICD-10-CM | POA: Diagnosis not present

## 2016-09-12 DIAGNOSIS — J3089 Other allergic rhinitis: Secondary | ICD-10-CM | POA: Diagnosis not present

## 2016-09-12 DIAGNOSIS — J3081 Allergic rhinitis due to animal (cat) (dog) hair and dander: Secondary | ICD-10-CM | POA: Diagnosis not present

## 2016-09-18 DIAGNOSIS — J3089 Other allergic rhinitis: Secondary | ICD-10-CM | POA: Diagnosis not present

## 2016-09-18 DIAGNOSIS — J3081 Allergic rhinitis due to animal (cat) (dog) hair and dander: Secondary | ICD-10-CM | POA: Diagnosis not present

## 2016-09-18 DIAGNOSIS — J301 Allergic rhinitis due to pollen: Secondary | ICD-10-CM | POA: Diagnosis not present

## 2016-09-23 DIAGNOSIS — J3089 Other allergic rhinitis: Secondary | ICD-10-CM | POA: Diagnosis not present

## 2016-09-23 DIAGNOSIS — J3081 Allergic rhinitis due to animal (cat) (dog) hair and dander: Secondary | ICD-10-CM | POA: Diagnosis not present

## 2016-09-23 DIAGNOSIS — J301 Allergic rhinitis due to pollen: Secondary | ICD-10-CM | POA: Diagnosis not present

## 2016-10-07 DIAGNOSIS — J3089 Other allergic rhinitis: Secondary | ICD-10-CM | POA: Diagnosis not present

## 2016-10-07 DIAGNOSIS — J301 Allergic rhinitis due to pollen: Secondary | ICD-10-CM | POA: Diagnosis not present

## 2016-10-07 DIAGNOSIS — J3081 Allergic rhinitis due to animal (cat) (dog) hair and dander: Secondary | ICD-10-CM | POA: Diagnosis not present

## 2016-10-21 DIAGNOSIS — J301 Allergic rhinitis due to pollen: Secondary | ICD-10-CM | POA: Diagnosis not present

## 2016-10-21 DIAGNOSIS — J3081 Allergic rhinitis due to animal (cat) (dog) hair and dander: Secondary | ICD-10-CM | POA: Diagnosis not present

## 2016-10-21 DIAGNOSIS — J3089 Other allergic rhinitis: Secondary | ICD-10-CM | POA: Diagnosis not present

## 2016-10-31 DIAGNOSIS — J3081 Allergic rhinitis due to animal (cat) (dog) hair and dander: Secondary | ICD-10-CM | POA: Diagnosis not present

## 2016-10-31 DIAGNOSIS — J301 Allergic rhinitis due to pollen: Secondary | ICD-10-CM | POA: Diagnosis not present

## 2016-10-31 DIAGNOSIS — J3089 Other allergic rhinitis: Secondary | ICD-10-CM | POA: Diagnosis not present

## 2016-11-13 DIAGNOSIS — N760 Acute vaginitis: Secondary | ICD-10-CM | POA: Diagnosis not present

## 2016-11-13 DIAGNOSIS — N762 Acute vulvitis: Secondary | ICD-10-CM | POA: Diagnosis not present

## 2016-11-21 DIAGNOSIS — J301 Allergic rhinitis due to pollen: Secondary | ICD-10-CM | POA: Diagnosis not present

## 2016-11-21 DIAGNOSIS — J3089 Other allergic rhinitis: Secondary | ICD-10-CM | POA: Diagnosis not present

## 2016-11-22 DIAGNOSIS — B356 Tinea cruris: Secondary | ICD-10-CM | POA: Diagnosis not present

## 2016-12-05 DIAGNOSIS — J3081 Allergic rhinitis due to animal (cat) (dog) hair and dander: Secondary | ICD-10-CM | POA: Diagnosis not present

## 2016-12-05 DIAGNOSIS — J3089 Other allergic rhinitis: Secondary | ICD-10-CM | POA: Diagnosis not present

## 2016-12-05 DIAGNOSIS — J301 Allergic rhinitis due to pollen: Secondary | ICD-10-CM | POA: Diagnosis not present

## 2016-12-10 DIAGNOSIS — J3089 Other allergic rhinitis: Secondary | ICD-10-CM | POA: Diagnosis not present

## 2016-12-10 DIAGNOSIS — J3081 Allergic rhinitis due to animal (cat) (dog) hair and dander: Secondary | ICD-10-CM | POA: Diagnosis not present

## 2016-12-12 DIAGNOSIS — J301 Allergic rhinitis due to pollen: Secondary | ICD-10-CM | POA: Diagnosis not present

## 2016-12-13 DIAGNOSIS — J301 Allergic rhinitis due to pollen: Secondary | ICD-10-CM | POA: Diagnosis not present

## 2016-12-13 DIAGNOSIS — J3081 Allergic rhinitis due to animal (cat) (dog) hair and dander: Secondary | ICD-10-CM | POA: Diagnosis not present

## 2016-12-13 DIAGNOSIS — J3089 Other allergic rhinitis: Secondary | ICD-10-CM | POA: Diagnosis not present

## 2016-12-27 DIAGNOSIS — J3089 Other allergic rhinitis: Secondary | ICD-10-CM | POA: Diagnosis not present

## 2016-12-27 DIAGNOSIS — J301 Allergic rhinitis due to pollen: Secondary | ICD-10-CM | POA: Diagnosis not present

## 2017-02-18 DIAGNOSIS — J3089 Other allergic rhinitis: Secondary | ICD-10-CM | POA: Diagnosis not present

## 2017-02-18 DIAGNOSIS — J3081 Allergic rhinitis due to animal (cat) (dog) hair and dander: Secondary | ICD-10-CM | POA: Diagnosis not present

## 2017-02-18 DIAGNOSIS — J301 Allergic rhinitis due to pollen: Secondary | ICD-10-CM | POA: Diagnosis not present

## 2017-02-27 DIAGNOSIS — J3089 Other allergic rhinitis: Secondary | ICD-10-CM | POA: Diagnosis not present

## 2017-02-27 DIAGNOSIS — J301 Allergic rhinitis due to pollen: Secondary | ICD-10-CM | POA: Diagnosis not present

## 2017-02-27 DIAGNOSIS — J3081 Allergic rhinitis due to animal (cat) (dog) hair and dander: Secondary | ICD-10-CM | POA: Diagnosis not present

## 2017-03-07 DIAGNOSIS — J3081 Allergic rhinitis due to animal (cat) (dog) hair and dander: Secondary | ICD-10-CM | POA: Diagnosis not present

## 2017-03-07 DIAGNOSIS — J301 Allergic rhinitis due to pollen: Secondary | ICD-10-CM | POA: Diagnosis not present

## 2017-03-07 DIAGNOSIS — J3089 Other allergic rhinitis: Secondary | ICD-10-CM | POA: Diagnosis not present

## 2017-03-13 DIAGNOSIS — J3081 Allergic rhinitis due to animal (cat) (dog) hair and dander: Secondary | ICD-10-CM | POA: Diagnosis not present

## 2017-03-13 DIAGNOSIS — J3089 Other allergic rhinitis: Secondary | ICD-10-CM | POA: Diagnosis not present

## 2017-03-13 DIAGNOSIS — J301 Allergic rhinitis due to pollen: Secondary | ICD-10-CM | POA: Diagnosis not present

## 2017-03-25 DIAGNOSIS — J3089 Other allergic rhinitis: Secondary | ICD-10-CM | POA: Diagnosis not present

## 2017-03-25 DIAGNOSIS — J3081 Allergic rhinitis due to animal (cat) (dog) hair and dander: Secondary | ICD-10-CM | POA: Diagnosis not present

## 2017-03-25 DIAGNOSIS — J301 Allergic rhinitis due to pollen: Secondary | ICD-10-CM | POA: Diagnosis not present

## 2017-04-03 DIAGNOSIS — J3081 Allergic rhinitis due to animal (cat) (dog) hair and dander: Secondary | ICD-10-CM | POA: Diagnosis not present

## 2017-04-03 DIAGNOSIS — J3089 Other allergic rhinitis: Secondary | ICD-10-CM | POA: Diagnosis not present

## 2017-04-03 DIAGNOSIS — J301 Allergic rhinitis due to pollen: Secondary | ICD-10-CM | POA: Diagnosis not present

## 2017-04-11 DIAGNOSIS — J3081 Allergic rhinitis due to animal (cat) (dog) hair and dander: Secondary | ICD-10-CM | POA: Diagnosis not present

## 2017-04-11 DIAGNOSIS — J3089 Other allergic rhinitis: Secondary | ICD-10-CM | POA: Diagnosis not present

## 2017-04-11 DIAGNOSIS — J301 Allergic rhinitis due to pollen: Secondary | ICD-10-CM | POA: Diagnosis not present

## 2017-04-22 DIAGNOSIS — H1045 Other chronic allergic conjunctivitis: Secondary | ICD-10-CM | POA: Diagnosis not present

## 2017-04-22 DIAGNOSIS — J3081 Allergic rhinitis due to animal (cat) (dog) hair and dander: Secondary | ICD-10-CM | POA: Diagnosis not present

## 2017-04-22 DIAGNOSIS — J3089 Other allergic rhinitis: Secondary | ICD-10-CM | POA: Diagnosis not present

## 2017-04-22 DIAGNOSIS — J301 Allergic rhinitis due to pollen: Secondary | ICD-10-CM | POA: Diagnosis not present

## 2017-04-22 DIAGNOSIS — J4530 Mild persistent asthma, uncomplicated: Secondary | ICD-10-CM | POA: Diagnosis not present

## 2017-05-01 DIAGNOSIS — J3089 Other allergic rhinitis: Secondary | ICD-10-CM | POA: Diagnosis not present

## 2017-05-01 DIAGNOSIS — J301 Allergic rhinitis due to pollen: Secondary | ICD-10-CM | POA: Diagnosis not present

## 2017-05-01 DIAGNOSIS — J3081 Allergic rhinitis due to animal (cat) (dog) hair and dander: Secondary | ICD-10-CM | POA: Diagnosis not present

## 2017-05-09 DIAGNOSIS — J301 Allergic rhinitis due to pollen: Secondary | ICD-10-CM | POA: Diagnosis not present

## 2017-05-09 DIAGNOSIS — J3089 Other allergic rhinitis: Secondary | ICD-10-CM | POA: Diagnosis not present

## 2017-05-09 DIAGNOSIS — J3081 Allergic rhinitis due to animal (cat) (dog) hair and dander: Secondary | ICD-10-CM | POA: Diagnosis not present

## 2017-05-13 DIAGNOSIS — J301 Allergic rhinitis due to pollen: Secondary | ICD-10-CM | POA: Diagnosis not present

## 2017-05-13 DIAGNOSIS — J3089 Other allergic rhinitis: Secondary | ICD-10-CM | POA: Diagnosis not present

## 2017-05-13 DIAGNOSIS — J3081 Allergic rhinitis due to animal (cat) (dog) hair and dander: Secondary | ICD-10-CM | POA: Diagnosis not present

## 2017-05-20 DIAGNOSIS — J3089 Other allergic rhinitis: Secondary | ICD-10-CM | POA: Diagnosis not present

## 2017-05-20 DIAGNOSIS — J301 Allergic rhinitis due to pollen: Secondary | ICD-10-CM | POA: Diagnosis not present

## 2017-05-20 DIAGNOSIS — J3081 Allergic rhinitis due to animal (cat) (dog) hair and dander: Secondary | ICD-10-CM | POA: Diagnosis not present

## 2017-05-27 DIAGNOSIS — J301 Allergic rhinitis due to pollen: Secondary | ICD-10-CM | POA: Diagnosis not present

## 2017-05-27 DIAGNOSIS — J3089 Other allergic rhinitis: Secondary | ICD-10-CM | POA: Diagnosis not present

## 2017-06-05 DIAGNOSIS — J3081 Allergic rhinitis due to animal (cat) (dog) hair and dander: Secondary | ICD-10-CM | POA: Diagnosis not present

## 2017-06-05 DIAGNOSIS — J3089 Other allergic rhinitis: Secondary | ICD-10-CM | POA: Diagnosis not present

## 2017-06-05 DIAGNOSIS — J301 Allergic rhinitis due to pollen: Secondary | ICD-10-CM | POA: Diagnosis not present

## 2017-06-17 DIAGNOSIS — J3089 Other allergic rhinitis: Secondary | ICD-10-CM | POA: Diagnosis not present

## 2017-06-17 DIAGNOSIS — J3081 Allergic rhinitis due to animal (cat) (dog) hair and dander: Secondary | ICD-10-CM | POA: Diagnosis not present

## 2017-06-17 DIAGNOSIS — J301 Allergic rhinitis due to pollen: Secondary | ICD-10-CM | POA: Diagnosis not present

## 2017-08-08 DIAGNOSIS — L821 Other seborrheic keratosis: Secondary | ICD-10-CM | POA: Diagnosis not present

## 2017-08-08 DIAGNOSIS — D2272 Melanocytic nevi of left lower limb, including hip: Secondary | ICD-10-CM | POA: Diagnosis not present

## 2017-08-08 DIAGNOSIS — D1801 Hemangioma of skin and subcutaneous tissue: Secondary | ICD-10-CM | POA: Diagnosis not present

## 2017-08-08 DIAGNOSIS — D045 Carcinoma in situ of skin of trunk: Secondary | ICD-10-CM | POA: Diagnosis not present

## 2017-08-08 DIAGNOSIS — D2261 Melanocytic nevi of right upper limb, including shoulder: Secondary | ICD-10-CM | POA: Diagnosis not present

## 2017-08-21 DIAGNOSIS — J3081 Allergic rhinitis due to animal (cat) (dog) hair and dander: Secondary | ICD-10-CM | POA: Diagnosis not present

## 2017-08-21 DIAGNOSIS — J3089 Other allergic rhinitis: Secondary | ICD-10-CM | POA: Diagnosis not present

## 2017-08-21 DIAGNOSIS — J301 Allergic rhinitis due to pollen: Secondary | ICD-10-CM | POA: Diagnosis not present

## 2017-08-26 DIAGNOSIS — J3089 Other allergic rhinitis: Secondary | ICD-10-CM | POA: Diagnosis not present

## 2017-08-26 DIAGNOSIS — J301 Allergic rhinitis due to pollen: Secondary | ICD-10-CM | POA: Diagnosis not present

## 2017-08-26 DIAGNOSIS — J3081 Allergic rhinitis due to animal (cat) (dog) hair and dander: Secondary | ICD-10-CM | POA: Diagnosis not present

## 2017-09-02 DIAGNOSIS — Z1231 Encounter for screening mammogram for malignant neoplasm of breast: Secondary | ICD-10-CM | POA: Diagnosis not present

## 2017-09-02 LAB — HM MAMMOGRAPHY

## 2017-09-09 DIAGNOSIS — J301 Allergic rhinitis due to pollen: Secondary | ICD-10-CM | POA: Diagnosis not present

## 2017-09-09 DIAGNOSIS — J3081 Allergic rhinitis due to animal (cat) (dog) hair and dander: Secondary | ICD-10-CM | POA: Diagnosis not present

## 2017-09-09 DIAGNOSIS — J3089 Other allergic rhinitis: Secondary | ICD-10-CM | POA: Diagnosis not present

## 2017-09-09 DIAGNOSIS — Z01419 Encounter for gynecological examination (general) (routine) without abnormal findings: Secondary | ICD-10-CM | POA: Diagnosis not present

## 2017-09-09 DIAGNOSIS — Z6830 Body mass index (BMI) 30.0-30.9, adult: Secondary | ICD-10-CM | POA: Diagnosis not present

## 2017-09-09 LAB — HM PAP SMEAR: HM Pap smear: NEGATIVE

## 2017-09-12 DIAGNOSIS — J3081 Allergic rhinitis due to animal (cat) (dog) hair and dander: Secondary | ICD-10-CM | POA: Diagnosis not present

## 2017-09-12 DIAGNOSIS — J301 Allergic rhinitis due to pollen: Secondary | ICD-10-CM | POA: Diagnosis not present

## 2017-09-12 DIAGNOSIS — J3089 Other allergic rhinitis: Secondary | ICD-10-CM | POA: Diagnosis not present

## 2017-09-18 DIAGNOSIS — J3081 Allergic rhinitis due to animal (cat) (dog) hair and dander: Secondary | ICD-10-CM | POA: Diagnosis not present

## 2017-09-18 DIAGNOSIS — J301 Allergic rhinitis due to pollen: Secondary | ICD-10-CM | POA: Diagnosis not present

## 2017-09-18 DIAGNOSIS — J3089 Other allergic rhinitis: Secondary | ICD-10-CM | POA: Diagnosis not present

## 2017-09-23 DIAGNOSIS — M545 Low back pain: Secondary | ICD-10-CM | POA: Diagnosis not present

## 2017-09-23 DIAGNOSIS — M25552 Pain in left hip: Secondary | ICD-10-CM | POA: Diagnosis not present

## 2017-09-23 DIAGNOSIS — M47816 Spondylosis without myelopathy or radiculopathy, lumbar region: Secondary | ICD-10-CM | POA: Diagnosis not present

## 2017-09-23 DIAGNOSIS — M25551 Pain in right hip: Secondary | ICD-10-CM | POA: Diagnosis not present

## 2017-09-25 DIAGNOSIS — J301 Allergic rhinitis due to pollen: Secondary | ICD-10-CM | POA: Diagnosis not present

## 2017-09-25 DIAGNOSIS — J3081 Allergic rhinitis due to animal (cat) (dog) hair and dander: Secondary | ICD-10-CM | POA: Diagnosis not present

## 2017-09-25 DIAGNOSIS — J3089 Other allergic rhinitis: Secondary | ICD-10-CM | POA: Diagnosis not present

## 2017-10-02 DIAGNOSIS — J3081 Allergic rhinitis due to animal (cat) (dog) hair and dander: Secondary | ICD-10-CM | POA: Diagnosis not present

## 2017-10-02 DIAGNOSIS — J301 Allergic rhinitis due to pollen: Secondary | ICD-10-CM | POA: Diagnosis not present

## 2017-10-02 DIAGNOSIS — J3089 Other allergic rhinitis: Secondary | ICD-10-CM | POA: Diagnosis not present

## 2017-10-03 DIAGNOSIS — Z13228 Encounter for screening for other metabolic disorders: Secondary | ICD-10-CM | POA: Diagnosis not present

## 2017-10-03 DIAGNOSIS — Z13220 Encounter for screening for lipoid disorders: Secondary | ICD-10-CM | POA: Diagnosis not present

## 2017-10-03 DIAGNOSIS — Z1329 Encounter for screening for other suspected endocrine disorder: Secondary | ICD-10-CM | POA: Diagnosis not present

## 2017-10-03 DIAGNOSIS — N951 Menopausal and female climacteric states: Secondary | ICD-10-CM | POA: Diagnosis not present

## 2017-10-03 DIAGNOSIS — Z13820 Encounter for screening for osteoporosis: Secondary | ICD-10-CM | POA: Diagnosis not present

## 2017-10-09 DIAGNOSIS — J3089 Other allergic rhinitis: Secondary | ICD-10-CM | POA: Diagnosis not present

## 2017-10-09 DIAGNOSIS — J3081 Allergic rhinitis due to animal (cat) (dog) hair and dander: Secondary | ICD-10-CM | POA: Diagnosis not present

## 2017-10-09 DIAGNOSIS — J301 Allergic rhinitis due to pollen: Secondary | ICD-10-CM | POA: Diagnosis not present

## 2017-10-16 DIAGNOSIS — J3081 Allergic rhinitis due to animal (cat) (dog) hair and dander: Secondary | ICD-10-CM | POA: Diagnosis not present

## 2017-10-16 DIAGNOSIS — J3089 Other allergic rhinitis: Secondary | ICD-10-CM | POA: Diagnosis not present

## 2017-10-16 DIAGNOSIS — J301 Allergic rhinitis due to pollen: Secondary | ICD-10-CM | POA: Diagnosis not present

## 2017-10-23 DIAGNOSIS — J301 Allergic rhinitis due to pollen: Secondary | ICD-10-CM | POA: Diagnosis not present

## 2017-10-23 DIAGNOSIS — J3089 Other allergic rhinitis: Secondary | ICD-10-CM | POA: Diagnosis not present

## 2017-10-23 DIAGNOSIS — J3081 Allergic rhinitis due to animal (cat) (dog) hair and dander: Secondary | ICD-10-CM | POA: Diagnosis not present

## 2017-11-06 DIAGNOSIS — J3089 Other allergic rhinitis: Secondary | ICD-10-CM | POA: Diagnosis not present

## 2017-11-06 DIAGNOSIS — J301 Allergic rhinitis due to pollen: Secondary | ICD-10-CM | POA: Diagnosis not present

## 2017-11-06 DIAGNOSIS — J3081 Allergic rhinitis due to animal (cat) (dog) hair and dander: Secondary | ICD-10-CM | POA: Diagnosis not present

## 2017-11-11 DIAGNOSIS — J3081 Allergic rhinitis due to animal (cat) (dog) hair and dander: Secondary | ICD-10-CM | POA: Diagnosis not present

## 2017-11-11 DIAGNOSIS — J3089 Other allergic rhinitis: Secondary | ICD-10-CM | POA: Diagnosis not present

## 2017-11-11 DIAGNOSIS — J301 Allergic rhinitis due to pollen: Secondary | ICD-10-CM | POA: Diagnosis not present

## 2017-11-27 DIAGNOSIS — J3081 Allergic rhinitis due to animal (cat) (dog) hair and dander: Secondary | ICD-10-CM | POA: Diagnosis not present

## 2017-11-27 DIAGNOSIS — J301 Allergic rhinitis due to pollen: Secondary | ICD-10-CM | POA: Diagnosis not present

## 2017-11-27 DIAGNOSIS — J3089 Other allergic rhinitis: Secondary | ICD-10-CM | POA: Diagnosis not present

## 2017-11-28 DIAGNOSIS — L821 Other seborrheic keratosis: Secondary | ICD-10-CM | POA: Diagnosis not present

## 2017-11-28 DIAGNOSIS — L570 Actinic keratosis: Secondary | ICD-10-CM | POA: Diagnosis not present

## 2017-11-28 DIAGNOSIS — Z85828 Personal history of other malignant neoplasm of skin: Secondary | ICD-10-CM | POA: Diagnosis not present

## 2017-12-02 DIAGNOSIS — J3089 Other allergic rhinitis: Secondary | ICD-10-CM | POA: Diagnosis not present

## 2017-12-02 DIAGNOSIS — J301 Allergic rhinitis due to pollen: Secondary | ICD-10-CM | POA: Diagnosis not present

## 2017-12-02 DIAGNOSIS — J3081 Allergic rhinitis due to animal (cat) (dog) hair and dander: Secondary | ICD-10-CM | POA: Diagnosis not present

## 2017-12-04 DIAGNOSIS — E559 Vitamin D deficiency, unspecified: Secondary | ICD-10-CM | POA: Diagnosis not present

## 2017-12-05 DIAGNOSIS — M6281 Muscle weakness (generalized): Secondary | ICD-10-CM | POA: Diagnosis not present

## 2017-12-05 DIAGNOSIS — N393 Stress incontinence (female) (male): Secondary | ICD-10-CM | POA: Diagnosis not present

## 2017-12-05 DIAGNOSIS — M6289 Other specified disorders of muscle: Secondary | ICD-10-CM | POA: Diagnosis not present

## 2017-12-05 DIAGNOSIS — N8110 Cystocele, unspecified: Secondary | ICD-10-CM | POA: Diagnosis not present

## 2017-12-11 DIAGNOSIS — J3081 Allergic rhinitis due to animal (cat) (dog) hair and dander: Secondary | ICD-10-CM | POA: Diagnosis not present

## 2017-12-11 DIAGNOSIS — J301 Allergic rhinitis due to pollen: Secondary | ICD-10-CM | POA: Diagnosis not present

## 2017-12-11 DIAGNOSIS — J3089 Other allergic rhinitis: Secondary | ICD-10-CM | POA: Diagnosis not present

## 2017-12-12 DIAGNOSIS — J301 Allergic rhinitis due to pollen: Secondary | ICD-10-CM | POA: Diagnosis not present

## 2017-12-15 DIAGNOSIS — J3081 Allergic rhinitis due to animal (cat) (dog) hair and dander: Secondary | ICD-10-CM | POA: Diagnosis not present

## 2017-12-15 DIAGNOSIS — J3089 Other allergic rhinitis: Secondary | ICD-10-CM | POA: Diagnosis not present

## 2017-12-19 DIAGNOSIS — N8110 Cystocele, unspecified: Secondary | ICD-10-CM | POA: Diagnosis not present

## 2017-12-19 DIAGNOSIS — N393 Stress incontinence (female) (male): Secondary | ICD-10-CM | POA: Diagnosis not present

## 2017-12-19 DIAGNOSIS — M6289 Other specified disorders of muscle: Secondary | ICD-10-CM | POA: Diagnosis not present

## 2017-12-19 DIAGNOSIS — M6281 Muscle weakness (generalized): Secondary | ICD-10-CM | POA: Diagnosis not present

## 2018-01-01 DIAGNOSIS — J3081 Allergic rhinitis due to animal (cat) (dog) hair and dander: Secondary | ICD-10-CM | POA: Diagnosis not present

## 2018-01-01 DIAGNOSIS — J3089 Other allergic rhinitis: Secondary | ICD-10-CM | POA: Diagnosis not present

## 2018-01-01 DIAGNOSIS — J301 Allergic rhinitis due to pollen: Secondary | ICD-10-CM | POA: Diagnosis not present

## 2018-01-02 DIAGNOSIS — M62838 Other muscle spasm: Secondary | ICD-10-CM | POA: Diagnosis not present

## 2018-01-02 DIAGNOSIS — M6281 Muscle weakness (generalized): Secondary | ICD-10-CM | POA: Diagnosis not present

## 2018-01-02 DIAGNOSIS — M6289 Other specified disorders of muscle: Secondary | ICD-10-CM | POA: Diagnosis not present

## 2018-01-02 DIAGNOSIS — N393 Stress incontinence (female) (male): Secondary | ICD-10-CM | POA: Diagnosis not present

## 2018-01-09 DIAGNOSIS — J3089 Other allergic rhinitis: Secondary | ICD-10-CM | POA: Diagnosis not present

## 2018-01-09 DIAGNOSIS — J301 Allergic rhinitis due to pollen: Secondary | ICD-10-CM | POA: Diagnosis not present

## 2018-01-09 DIAGNOSIS — J3081 Allergic rhinitis due to animal (cat) (dog) hair and dander: Secondary | ICD-10-CM | POA: Diagnosis not present

## 2018-01-16 DIAGNOSIS — J301 Allergic rhinitis due to pollen: Secondary | ICD-10-CM | POA: Diagnosis not present

## 2018-01-16 DIAGNOSIS — J3089 Other allergic rhinitis: Secondary | ICD-10-CM | POA: Diagnosis not present

## 2018-01-16 DIAGNOSIS — J3081 Allergic rhinitis due to animal (cat) (dog) hair and dander: Secondary | ICD-10-CM | POA: Diagnosis not present

## 2018-01-22 DIAGNOSIS — J3081 Allergic rhinitis due to animal (cat) (dog) hair and dander: Secondary | ICD-10-CM | POA: Diagnosis not present

## 2018-01-22 DIAGNOSIS — J301 Allergic rhinitis due to pollen: Secondary | ICD-10-CM | POA: Diagnosis not present

## 2018-01-22 DIAGNOSIS — E559 Vitamin D deficiency, unspecified: Secondary | ICD-10-CM | POA: Diagnosis not present

## 2018-01-22 DIAGNOSIS — J3089 Other allergic rhinitis: Secondary | ICD-10-CM | POA: Diagnosis not present

## 2018-01-30 DIAGNOSIS — M6281 Muscle weakness (generalized): Secondary | ICD-10-CM | POA: Diagnosis not present

## 2018-01-30 DIAGNOSIS — J301 Allergic rhinitis due to pollen: Secondary | ICD-10-CM | POA: Diagnosis not present

## 2018-01-30 DIAGNOSIS — N393 Stress incontinence (female) (male): Secondary | ICD-10-CM | POA: Diagnosis not present

## 2018-01-30 DIAGNOSIS — M6289 Other specified disorders of muscle: Secondary | ICD-10-CM | POA: Diagnosis not present

## 2018-01-30 DIAGNOSIS — M62838 Other muscle spasm: Secondary | ICD-10-CM | POA: Diagnosis not present

## 2018-01-30 DIAGNOSIS — J3089 Other allergic rhinitis: Secondary | ICD-10-CM | POA: Diagnosis not present

## 2018-01-30 DIAGNOSIS — J3081 Allergic rhinitis due to animal (cat) (dog) hair and dander: Secondary | ICD-10-CM | POA: Diagnosis not present

## 2018-02-03 DIAGNOSIS — J3081 Allergic rhinitis due to animal (cat) (dog) hair and dander: Secondary | ICD-10-CM | POA: Diagnosis not present

## 2018-02-03 DIAGNOSIS — J3089 Other allergic rhinitis: Secondary | ICD-10-CM | POA: Diagnosis not present

## 2018-02-03 DIAGNOSIS — J301 Allergic rhinitis due to pollen: Secondary | ICD-10-CM | POA: Diagnosis not present

## 2018-02-12 DIAGNOSIS — J301 Allergic rhinitis due to pollen: Secondary | ICD-10-CM | POA: Diagnosis not present

## 2018-02-12 DIAGNOSIS — J3089 Other allergic rhinitis: Secondary | ICD-10-CM | POA: Diagnosis not present

## 2018-02-12 DIAGNOSIS — J3081 Allergic rhinitis due to animal (cat) (dog) hair and dander: Secondary | ICD-10-CM | POA: Diagnosis not present

## 2018-02-19 DIAGNOSIS — J3081 Allergic rhinitis due to animal (cat) (dog) hair and dander: Secondary | ICD-10-CM | POA: Diagnosis not present

## 2018-02-19 DIAGNOSIS — J301 Allergic rhinitis due to pollen: Secondary | ICD-10-CM | POA: Diagnosis not present

## 2018-02-19 DIAGNOSIS — J3089 Other allergic rhinitis: Secondary | ICD-10-CM | POA: Diagnosis not present

## 2018-02-23 DIAGNOSIS — N393 Stress incontinence (female) (male): Secondary | ICD-10-CM | POA: Diagnosis not present

## 2018-02-23 DIAGNOSIS — M6281 Muscle weakness (generalized): Secondary | ICD-10-CM | POA: Diagnosis not present

## 2018-02-23 DIAGNOSIS — M62838 Other muscle spasm: Secondary | ICD-10-CM | POA: Diagnosis not present

## 2018-02-23 DIAGNOSIS — M6289 Other specified disorders of muscle: Secondary | ICD-10-CM | POA: Diagnosis not present

## 2018-02-24 DIAGNOSIS — H1045 Other chronic allergic conjunctivitis: Secondary | ICD-10-CM | POA: Diagnosis not present

## 2018-02-24 DIAGNOSIS — J301 Allergic rhinitis due to pollen: Secondary | ICD-10-CM | POA: Diagnosis not present

## 2018-02-24 DIAGNOSIS — J3089 Other allergic rhinitis: Secondary | ICD-10-CM | POA: Diagnosis not present

## 2018-02-24 DIAGNOSIS — J3081 Allergic rhinitis due to animal (cat) (dog) hair and dander: Secondary | ICD-10-CM | POA: Diagnosis not present

## 2018-02-24 DIAGNOSIS — J4530 Mild persistent asthma, uncomplicated: Secondary | ICD-10-CM | POA: Diagnosis not present

## 2018-02-25 ENCOUNTER — Encounter: Payer: Self-pay | Admitting: Internal Medicine

## 2018-02-25 ENCOUNTER — Ambulatory Visit (INDEPENDENT_AMBULATORY_CARE_PROVIDER_SITE_OTHER): Payer: BLUE CROSS/BLUE SHIELD | Admitting: Internal Medicine

## 2018-02-25 ENCOUNTER — Other Ambulatory Visit (INDEPENDENT_AMBULATORY_CARE_PROVIDER_SITE_OTHER): Payer: BLUE CROSS/BLUE SHIELD

## 2018-02-25 VITALS — BP 124/80 | HR 77 | Temp 98.0°F | Ht 63.0 in | Wt 172.0 lb

## 2018-02-25 DIAGNOSIS — E538 Deficiency of other specified B group vitamins: Secondary | ICD-10-CM | POA: Insufficient documentation

## 2018-02-25 DIAGNOSIS — Z0000 Encounter for general adult medical examination without abnormal findings: Secondary | ICD-10-CM | POA: Diagnosis not present

## 2018-02-25 DIAGNOSIS — Z23 Encounter for immunization: Secondary | ICD-10-CM | POA: Diagnosis not present

## 2018-02-25 DIAGNOSIS — G9332 Myalgic encephalomyelitis/chronic fatigue syndrome: Secondary | ICD-10-CM | POA: Insufficient documentation

## 2018-02-25 DIAGNOSIS — R5383 Other fatigue: Secondary | ICD-10-CM | POA: Insufficient documentation

## 2018-02-25 DIAGNOSIS — G4700 Insomnia, unspecified: Secondary | ICD-10-CM | POA: Insufficient documentation

## 2018-02-25 LAB — URINALYSIS
BILIRUBIN URINE: NEGATIVE
Hgb urine dipstick: NEGATIVE
Ketones, ur: NEGATIVE
Leukocytes, UA: NEGATIVE
Nitrite: NEGATIVE
Specific Gravity, Urine: >=1.03 — AB (ref 1.000–1.030)
Total Protein, Urine: NEGATIVE
Urine Glucose: NEGATIVE
Urobilinogen, UA: 0.2 (ref 0.0–1.0)
pH: 5.5 (ref 5.0–8.0)

## 2018-02-25 LAB — CBC WITH DIFFERENTIAL/PLATELET
Basophils Absolute: 0.1 10*3/uL (ref 0.0–0.1)
Basophils Relative: 0.7 % (ref 0.0–3.0)
Eosinophils Absolute: 0.3 10*3/uL (ref 0.0–0.7)
Eosinophils Relative: 3.2 % (ref 0.0–5.0)
HCT: 39 % (ref 36.0–46.0)
Hemoglobin: 13.2 g/dL (ref 12.0–15.0)
Lymphocytes Relative: 31.8 % (ref 12.0–46.0)
Lymphs Abs: 2.5 10*3/uL (ref 0.7–4.0)
MCHC: 33.9 g/dL (ref 30.0–36.0)
MCV: 93.7 fl (ref 78.0–100.0)
Monocytes Absolute: 0.6 10*3/uL (ref 0.1–1.0)
Monocytes Relative: 7.8 % (ref 3.0–12.0)
Neutro Abs: 4.5 10*3/uL (ref 1.4–7.7)
Neutrophils Relative %: 56.5 % (ref 43.0–77.0)
Platelets: 268 10*3/uL (ref 150.0–400.0)
RBC: 4.16 Mil/uL (ref 3.87–5.11)
RDW: 12.3 % (ref 11.5–15.5)
WBC: 8 10*3/uL (ref 4.0–10.5)

## 2018-02-25 LAB — LIPID PANEL
Cholesterol: 209 mg/dL — ABNORMAL HIGH (ref 0–200)
HDL: 50.4 mg/dL (ref >39.00)
LDL CALC: 135 mg/dL — AB (ref 0–99)
NONHDL: 158.12
Total CHOL/HDL Ratio: 4
Triglycerides: 116 mg/dL (ref 0.0–149.0)
VLDL: 23.2 mg/dL (ref 0.0–40.0)

## 2018-02-25 LAB — BASIC METABOLIC PANEL
BUN: 15 mg/dL (ref 6–23)
CO2: 26 mEq/L (ref 19–32)
Calcium: 9.5 mg/dL (ref 8.4–10.5)
Chloride: 107 mEq/L (ref 96–112)
Creatinine, Ser: 0.7 mg/dL (ref 0.40–1.20)
GFR: 88.28 mL/min (ref >60.00)
Glucose, Bld: 100 mg/dL — ABNORMAL HIGH (ref 70–99)
Potassium: 4.1 mEq/L (ref 3.5–5.1)
Sodium: 142 mEq/L (ref 135–145)

## 2018-02-25 LAB — HEPATIC FUNCTION PANEL
ALT: 15 U/L (ref 0–35)
AST: 12 U/L (ref 0–37)
Albumin: 4.4 g/dL (ref 3.5–5.2)
Alkaline Phosphatase: 59 U/L (ref 39–117)
BILIRUBIN DIRECT: 0.1 mg/dL (ref 0.0–0.3)
Total Bilirubin: 0.3 mg/dL (ref 0.2–1.2)
Total Protein: 6.6 g/dL (ref 6.0–8.3)

## 2018-02-25 LAB — TSH: TSH: 1.34 u[IU]/mL (ref 0.35–4.50)

## 2018-02-25 LAB — VITAMIN B12: Vitamin B-12: 629 pg/mL (ref 211–911)

## 2018-02-25 MED ORDER — ALPRAZOLAM 0.5 MG PO TABS: 0.5000 mg | ORAL_TABLET | Freq: Every evening | ORAL | 2 refills | Status: DC | PRN

## 2018-02-25 MED ORDER — VITAMIN D3 50 MCG (2000 UT) PO CAPS: 2000.0000 [IU] | ORAL_CAPSULE | Freq: Every day | ORAL | 3 refills | Status: DC

## 2018-02-25 NOTE — Progress Notes (Signed)
Subjective:  Patient ID: Shannon [name], female    DOB: Sep 27, 1965  Age: 53 y.o. MRN: [id number]  CC: No chief complaint on file.   HPI Shannon [name] presents for a well exam C/o fatigue x 1 year, stress, poor sleep  Outpatient Medications Prior to Visit  Medication Sig Dispense Refill  . albuterol (PROAIR HFA) 108 (90 Base) MCG/ACT inhaler Inhale 2 puffs into the lungs every 4 (four) hours as needed for wheezing or shortness of breath. 1 Inhaler 5  . azelastine (ASTELIN) 0.1 % nasal spray Place 1 spray into both nostrils daily. Use in each nostril as directed    . loratadine (CLARITIN) 10 MG tablet Take 1 tablet (10 mg total) by mouth daily. 90 tablet 3  . montelukast (SINGULAIR) 10 MG tablet Take 1 tablet (10 mg total) by mouth daily. 90 tablet 3  . guaiFENesin (MUCINEX) 600 MG 12 hr tablet Take 1 tablet (600 mg total) by mouth 2 (two) times daily as needed for cough or to loosen phlegm. (Patient not taking: Reported on 02/19/2016) 14 tablet 0  . ipratropium (ATROVENT) 0.03 % nasal spray Place 2 sprays into both nostrils 2 (two) times daily. Do not use for more than 5days. (Patient not taking: Reported on 02/19/2016) 30 mL 12  . promethazine-dextromethorphan (PROMETHAZINE-DM) 6.25-15 MG/5ML syrup Take 5 mLs by mouth 3 (three) times daily as needed for cough. 240 mL 0  . ranitidine (ZANTAC) 300 MG tablet Take 1 tablet (300 mg total) by mouth at bedtime. 30 tablet 5   No facility-administered medications prior to visit.     ROS: Review of Systems  Constitutional: Positive for fatigue. Negative for activity change, appetite change, chills and unexpected weight change.  HENT: Negative for congestion, mouth sores and sinus pressure.   Eyes: Negative for visual disturbance.  Respiratory: Negative for cough and chest tightness.   Gastrointestinal: Negative for abdominal pain and nausea.  Genitourinary: Negative for difficulty urinating, frequency and vaginal pain.    Musculoskeletal: Positive for arthralgias. Negative for back pain and gait problem.  Skin: Negative for pallor and rash.  Neurological: Negative for dizziness, tremors, weakness, numbness and headaches.  Psychiatric/Behavioral: Positive for sleep disturbance. Negative for confusion, dysphoric mood and suicidal ideas. The patient is nervous/anxious.     Objective:  BP 124/80 (BP Location: Left Arm, Patient Position: Sitting, Cuff Size: Normal)   Pulse 77   Temp 98 F (36.7 C) (Oral)   Ht 5\' 3"  (1.6 m)   Wt 172 lb (78 kg)   SpO2 96%   BMI 30.47 kg/m   BP Readings from Last 3 Encounters:  02/25/18 124/80  02/19/16 120/80  01/30/16 120/82    Wt Readings from Last 3 Encounters:  02/25/18 172 lb (78 kg)  02/19/16 166 lb (75.3 kg)  01/30/16 167 lb (75.8 kg)    Physical Exam Constitutional:      General: She is not in acute distress.    Appearance: She is well-developed.  HENT:     Head: Normocephalic.     Right Ear: External ear normal.     Left Ear: External ear normal.     Nose: Nose normal.  Eyes:     General:        Right eye: No discharge.        Left eye: No discharge.     Conjunctiva/sclera: Conjunctivae normal.     Pupils: Pupils are equal, round, and reactive to light.  Neck:     Musculoskeletal:  Normal range of motion and neck supple.     Thyroid: No thyromegaly.     Vascular: No JVD.     Trachea: No tracheal deviation.  Cardiovascular:     Rate and Rhythm: Normal rate and regular rhythm.     Heart sounds: Normal heart sounds.  Pulmonary:     Effort: No respiratory distress.     Breath sounds: No stridor. No wheezing.  Abdominal:     General: Bowel sounds are normal. There is no distension.     Palpations: Abdomen is soft. There is no mass.     Tenderness: There is no abdominal tenderness. There is no guarding or rebound.  Musculoskeletal:        General: No tenderness.  Lymphadenopathy:     Cervical: No cervical adenopathy.  Skin:    Findings: No  erythema or rash.  Neurological:     Cranial Nerves: No cranial nerve deficit.     Motor: No abnormal muscle tone.     Coordination: Coordination normal.     Deep Tendon Reflexes: Reflexes normal.  Psychiatric:        Behavior: Behavior normal.        Thought Content: Thought content normal.        Judgment: Judgment normal.     Lab Results  Component Value Date   WBC 9.5 02/19/2016   HGB 13.2 02/19/2016   HCT 37.9 02/19/2016   PLT 328.0 02/19/2016   GLUCOSE 93 02/19/2016   CHOL 236 (H) 02/19/2016   TRIG 130.0 02/19/2016   HDL 62.20 02/19/2016   LDLCALC 148 (H) 02/19/2016   ALT 16 02/19/2016   AST 12 02/19/2016   NA 138 02/19/2016   K 4.0 02/19/2016   CL 103 02/19/2016   CREATININE 0.66 02/19/2016   BUN 12 02/19/2016   CO2 26 02/19/2016   TSH 1.37 02/19/2016   INR 1.2 (H) 11/18/2012   HGBA1C 5.2 12/23/2012    Dg Chest 2 View  Result Date: 01/30/2016 CLINICAL DATA:  Cough and congestion for 5 weeks.  Headaches chills EXAM: CHEST  2 VIEW COMPARISON:  12/25/2015 FINDINGS: The heart size and mediastinal contours are within normal limits. Both lungs are clear. The visualized skeletal structures are unremarkable. IMPRESSION: No active cardiopulmonary disease. Electronically Signed   By: Kerby Moors M.D.   On: 01/30/2016 17:32    Assessment & Plan:   There are no diagnoses linked to this encounter.   No orders of the defined types were placed in this encounter.    Follow-up: No follow-ups on file.  Walker Kehr, MD

## 2018-02-25 NOTE — Assessment & Plan Note (Signed)
Xanax prn 

## 2018-02-25 NOTE — Assessment & Plan Note (Signed)
x 1 year Labs Stress discussed

## 2018-02-25 NOTE — Assessment & Plan Note (Signed)
We discussed age appropriate health related issues, including available/recomended screening tests and vaccinations. We discussed a need for adhering to healthy diet and exercise. Labs were ordered to be later reviewed . All questions were answered. Colon 2016 Dr PQDIY, due 2026

## 2018-02-25 NOTE — Patient Instructions (Signed)
Weighted blanket 

## 2018-02-25 NOTE — Assessment & Plan Note (Signed)
On B12 [facility]

## 2018-03-03 DIAGNOSIS — J301 Allergic rhinitis due to pollen: Secondary | ICD-10-CM | POA: Diagnosis not present

## 2018-03-03 DIAGNOSIS — J3081 Allergic rhinitis due to animal (cat) (dog) hair and dander: Secondary | ICD-10-CM | POA: Diagnosis not present

## 2018-03-03 DIAGNOSIS — J3089 Other allergic rhinitis: Secondary | ICD-10-CM | POA: Diagnosis not present

## 2018-03-11 ENCOUNTER — Encounter: Payer: Self-pay | Admitting: Internal Medicine

## 2018-03-12 DIAGNOSIS — J3089 Other allergic rhinitis: Secondary | ICD-10-CM | POA: Diagnosis not present

## 2018-03-12 DIAGNOSIS — J301 Allergic rhinitis due to pollen: Secondary | ICD-10-CM | POA: Diagnosis not present

## 2018-03-12 DIAGNOSIS — J3081 Allergic rhinitis due to animal (cat) (dog) hair and dander: Secondary | ICD-10-CM | POA: Diagnosis not present

## 2018-03-17 DIAGNOSIS — N8110 Cystocele, unspecified: Secondary | ICD-10-CM | POA: Diagnosis not present

## 2018-03-17 DIAGNOSIS — M62838 Other muscle spasm: Secondary | ICD-10-CM | POA: Diagnosis not present

## 2018-03-17 DIAGNOSIS — M6281 Muscle weakness (generalized): Secondary | ICD-10-CM | POA: Diagnosis not present

## 2018-03-17 DIAGNOSIS — M6289 Other specified disorders of muscle: Secondary | ICD-10-CM | POA: Diagnosis not present

## 2018-03-19 DIAGNOSIS — J301 Allergic rhinitis due to pollen: Secondary | ICD-10-CM | POA: Diagnosis not present

## 2018-03-19 DIAGNOSIS — J3081 Allergic rhinitis due to animal (cat) (dog) hair and dander: Secondary | ICD-10-CM | POA: Diagnosis not present

## 2018-03-19 DIAGNOSIS — J3089 Other allergic rhinitis: Secondary | ICD-10-CM | POA: Diagnosis not present

## 2018-03-26 DIAGNOSIS — J3081 Allergic rhinitis due to animal (cat) (dog) hair and dander: Secondary | ICD-10-CM | POA: Diagnosis not present

## 2018-03-26 DIAGNOSIS — J301 Allergic rhinitis due to pollen: Secondary | ICD-10-CM | POA: Diagnosis not present

## 2018-03-26 DIAGNOSIS — J3089 Other allergic rhinitis: Secondary | ICD-10-CM | POA: Diagnosis not present

## 2018-04-03 DIAGNOSIS — J3081 Allergic rhinitis due to animal (cat) (dog) hair and dander: Secondary | ICD-10-CM | POA: Diagnosis not present

## 2018-04-03 DIAGNOSIS — J3089 Other allergic rhinitis: Secondary | ICD-10-CM | POA: Diagnosis not present

## 2018-04-03 DIAGNOSIS — J301 Allergic rhinitis due to pollen: Secondary | ICD-10-CM | POA: Diagnosis not present

## 2018-04-10 DIAGNOSIS — J301 Allergic rhinitis due to pollen: Secondary | ICD-10-CM | POA: Diagnosis not present

## 2018-04-10 DIAGNOSIS — J3081 Allergic rhinitis due to animal (cat) (dog) hair and dander: Secondary | ICD-10-CM | POA: Diagnosis not present

## 2018-04-10 DIAGNOSIS — J3089 Other allergic rhinitis: Secondary | ICD-10-CM | POA: Diagnosis not present

## 2018-04-17 DIAGNOSIS — J3081 Allergic rhinitis due to animal (cat) (dog) hair and dander: Secondary | ICD-10-CM | POA: Diagnosis not present

## 2018-04-17 DIAGNOSIS — J3089 Other allergic rhinitis: Secondary | ICD-10-CM | POA: Diagnosis not present

## 2018-04-17 DIAGNOSIS — J301 Allergic rhinitis due to pollen: Secondary | ICD-10-CM | POA: Diagnosis not present

## 2018-04-28 DIAGNOSIS — J3089 Other allergic rhinitis: Secondary | ICD-10-CM | POA: Diagnosis not present

## 2018-04-28 DIAGNOSIS — J301 Allergic rhinitis due to pollen: Secondary | ICD-10-CM | POA: Diagnosis not present

## 2018-04-28 DIAGNOSIS — J3081 Allergic rhinitis due to animal (cat) (dog) hair and dander: Secondary | ICD-10-CM | POA: Diagnosis not present

## 2018-05-12 DIAGNOSIS — J3081 Allergic rhinitis due to animal (cat) (dog) hair and dander: Secondary | ICD-10-CM | POA: Diagnosis not present

## 2018-05-12 DIAGNOSIS — J3089 Other allergic rhinitis: Secondary | ICD-10-CM | POA: Diagnosis not present

## 2018-05-12 DIAGNOSIS — J301 Allergic rhinitis due to pollen: Secondary | ICD-10-CM | POA: Diagnosis not present

## 2018-05-22 DIAGNOSIS — J301 Allergic rhinitis due to pollen: Secondary | ICD-10-CM | POA: Diagnosis not present

## 2018-05-22 DIAGNOSIS — J3089 Other allergic rhinitis: Secondary | ICD-10-CM | POA: Diagnosis not present

## 2018-05-22 DIAGNOSIS — J3081 Allergic rhinitis due to animal (cat) (dog) hair and dander: Secondary | ICD-10-CM | POA: Diagnosis not present

## 2018-05-29 DIAGNOSIS — J3081 Allergic rhinitis due to animal (cat) (dog) hair and dander: Secondary | ICD-10-CM | POA: Diagnosis not present

## 2018-05-29 DIAGNOSIS — J301 Allergic rhinitis due to pollen: Secondary | ICD-10-CM | POA: Diagnosis not present

## 2018-05-29 DIAGNOSIS — J3089 Other allergic rhinitis: Secondary | ICD-10-CM | POA: Diagnosis not present

## 2018-06-05 DIAGNOSIS — J301 Allergic rhinitis due to pollen: Secondary | ICD-10-CM | POA: Diagnosis not present

## 2018-06-05 DIAGNOSIS — J3081 Allergic rhinitis due to animal (cat) (dog) hair and dander: Secondary | ICD-10-CM | POA: Diagnosis not present

## 2018-06-05 DIAGNOSIS — J3089 Other allergic rhinitis: Secondary | ICD-10-CM | POA: Diagnosis not present

## 2018-06-10 DIAGNOSIS — M6281 Muscle weakness (generalized): Secondary | ICD-10-CM | POA: Diagnosis not present

## 2018-06-10 DIAGNOSIS — M62838 Other muscle spasm: Secondary | ICD-10-CM | POA: Diagnosis not present

## 2018-06-10 DIAGNOSIS — M6289 Other specified disorders of muscle: Secondary | ICD-10-CM | POA: Diagnosis not present

## 2018-06-10 DIAGNOSIS — N8110 Cystocele, unspecified: Secondary | ICD-10-CM | POA: Diagnosis not present

## 2018-06-12 DIAGNOSIS — J3089 Other allergic rhinitis: Secondary | ICD-10-CM | POA: Diagnosis not present

## 2018-06-12 DIAGNOSIS — J3081 Allergic rhinitis due to animal (cat) (dog) hair and dander: Secondary | ICD-10-CM | POA: Diagnosis not present

## 2018-06-12 DIAGNOSIS — J301 Allergic rhinitis due to pollen: Secondary | ICD-10-CM | POA: Diagnosis not present

## 2018-06-19 DIAGNOSIS — J301 Allergic rhinitis due to pollen: Secondary | ICD-10-CM | POA: Diagnosis not present

## 2018-06-19 DIAGNOSIS — J3081 Allergic rhinitis due to animal (cat) (dog) hair and dander: Secondary | ICD-10-CM | POA: Diagnosis not present

## 2018-06-19 DIAGNOSIS — J3089 Other allergic rhinitis: Secondary | ICD-10-CM | POA: Diagnosis not present

## 2018-06-26 DIAGNOSIS — J3089 Other allergic rhinitis: Secondary | ICD-10-CM | POA: Diagnosis not present

## 2018-06-26 DIAGNOSIS — J301 Allergic rhinitis due to pollen: Secondary | ICD-10-CM | POA: Diagnosis not present

## 2018-06-26 DIAGNOSIS — J3081 Allergic rhinitis due to animal (cat) (dog) hair and dander: Secondary | ICD-10-CM | POA: Diagnosis not present

## 2018-07-03 DIAGNOSIS — J301 Allergic rhinitis due to pollen: Secondary | ICD-10-CM | POA: Diagnosis not present

## 2018-07-03 DIAGNOSIS — J3081 Allergic rhinitis due to animal (cat) (dog) hair and dander: Secondary | ICD-10-CM | POA: Diagnosis not present

## 2018-07-03 DIAGNOSIS — J3089 Other allergic rhinitis: Secondary | ICD-10-CM | POA: Diagnosis not present

## 2018-07-09 DIAGNOSIS — J301 Allergic rhinitis due to pollen: Secondary | ICD-10-CM | POA: Diagnosis not present

## 2018-07-09 DIAGNOSIS — J3089 Other allergic rhinitis: Secondary | ICD-10-CM | POA: Diagnosis not present

## 2018-07-09 DIAGNOSIS — J3081 Allergic rhinitis due to animal (cat) (dog) hair and dander: Secondary | ICD-10-CM | POA: Diagnosis not present

## 2018-07-14 DIAGNOSIS — M6281 Muscle weakness (generalized): Secondary | ICD-10-CM | POA: Diagnosis not present

## 2018-07-14 DIAGNOSIS — M6289 Other specified disorders of muscle: Secondary | ICD-10-CM | POA: Diagnosis not present

## 2018-07-14 DIAGNOSIS — M62838 Other muscle spasm: Secondary | ICD-10-CM | POA: Diagnosis not present

## 2018-07-14 DIAGNOSIS — J3081 Allergic rhinitis due to animal (cat) (dog) hair and dander: Secondary | ICD-10-CM | POA: Diagnosis not present

## 2018-07-14 DIAGNOSIS — J301 Allergic rhinitis due to pollen: Secondary | ICD-10-CM | POA: Diagnosis not present

## 2018-07-14 DIAGNOSIS — N3946 Mixed incontinence: Secondary | ICD-10-CM | POA: Diagnosis not present

## 2018-07-14 DIAGNOSIS — J3089 Other allergic rhinitis: Secondary | ICD-10-CM | POA: Diagnosis not present

## 2018-07-23 DIAGNOSIS — J301 Allergic rhinitis due to pollen: Secondary | ICD-10-CM | POA: Diagnosis not present

## 2018-07-23 DIAGNOSIS — J3081 Allergic rhinitis due to animal (cat) (dog) hair and dander: Secondary | ICD-10-CM | POA: Diagnosis not present

## 2018-07-23 DIAGNOSIS — J3089 Other allergic rhinitis: Secondary | ICD-10-CM | POA: Diagnosis not present

## 2018-07-28 DIAGNOSIS — J301 Allergic rhinitis due to pollen: Secondary | ICD-10-CM | POA: Diagnosis not present

## 2018-07-28 DIAGNOSIS — J3089 Other allergic rhinitis: Secondary | ICD-10-CM | POA: Diagnosis not present

## 2018-07-28 DIAGNOSIS — J3081 Allergic rhinitis due to animal (cat) (dog) hair and dander: Secondary | ICD-10-CM | POA: Diagnosis not present

## 2018-08-06 DIAGNOSIS — J3089 Other allergic rhinitis: Secondary | ICD-10-CM | POA: Diagnosis not present

## 2018-08-06 DIAGNOSIS — J3081 Allergic rhinitis due to animal (cat) (dog) hair and dander: Secondary | ICD-10-CM | POA: Diagnosis not present

## 2018-08-06 DIAGNOSIS — J301 Allergic rhinitis due to pollen: Secondary | ICD-10-CM | POA: Diagnosis not present

## 2018-08-11 DIAGNOSIS — D2262 Melanocytic nevi of left upper limb, including shoulder: Secondary | ICD-10-CM | POA: Diagnosis not present

## 2018-08-11 DIAGNOSIS — D2272 Melanocytic nevi of left lower limb, including hip: Secondary | ICD-10-CM | POA: Diagnosis not present

## 2018-08-11 DIAGNOSIS — D225 Melanocytic nevi of trunk: Secondary | ICD-10-CM | POA: Diagnosis not present

## 2018-08-11 DIAGNOSIS — L570 Actinic keratosis: Secondary | ICD-10-CM | POA: Diagnosis not present

## 2018-08-11 DIAGNOSIS — D0462 Carcinoma in situ of skin of left upper limb, including shoulder: Secondary | ICD-10-CM | POA: Diagnosis not present

## 2018-08-11 DIAGNOSIS — D2261 Melanocytic nevi of right upper limb, including shoulder: Secondary | ICD-10-CM | POA: Diagnosis not present

## 2018-08-20 DIAGNOSIS — L564 Polymorphous light eruption: Secondary | ICD-10-CM | POA: Diagnosis not present

## 2018-08-27 DIAGNOSIS — M6281 Muscle weakness (generalized): Secondary | ICD-10-CM | POA: Diagnosis not present

## 2018-08-27 DIAGNOSIS — J3081 Allergic rhinitis due to animal (cat) (dog) hair and dander: Secondary | ICD-10-CM | POA: Diagnosis not present

## 2018-08-27 DIAGNOSIS — J3089 Other allergic rhinitis: Secondary | ICD-10-CM | POA: Diagnosis not present

## 2018-08-27 DIAGNOSIS — M6289 Other specified disorders of muscle: Secondary | ICD-10-CM | POA: Diagnosis not present

## 2018-08-27 DIAGNOSIS — M62838 Other muscle spasm: Secondary | ICD-10-CM | POA: Diagnosis not present

## 2018-08-27 DIAGNOSIS — N3946 Mixed incontinence: Secondary | ICD-10-CM | POA: Diagnosis not present

## 2018-08-27 DIAGNOSIS — J301 Allergic rhinitis due to pollen: Secondary | ICD-10-CM | POA: Diagnosis not present

## 2018-09-04 DIAGNOSIS — J301 Allergic rhinitis due to pollen: Secondary | ICD-10-CM | POA: Diagnosis not present

## 2018-09-04 DIAGNOSIS — J3081 Allergic rhinitis due to animal (cat) (dog) hair and dander: Secondary | ICD-10-CM | POA: Diagnosis not present

## 2018-09-04 DIAGNOSIS — J3089 Other allergic rhinitis: Secondary | ICD-10-CM | POA: Diagnosis not present

## 2018-09-07 DIAGNOSIS — J301 Allergic rhinitis due to pollen: Secondary | ICD-10-CM | POA: Diagnosis not present

## 2018-09-08 DIAGNOSIS — J3081 Allergic rhinitis due to animal (cat) (dog) hair and dander: Secondary | ICD-10-CM | POA: Diagnosis not present

## 2018-09-08 DIAGNOSIS — J3089 Other allergic rhinitis: Secondary | ICD-10-CM | POA: Diagnosis not present

## 2018-09-15 DIAGNOSIS — J301 Allergic rhinitis due to pollen: Secondary | ICD-10-CM | POA: Diagnosis not present

## 2018-09-15 DIAGNOSIS — J3081 Allergic rhinitis due to animal (cat) (dog) hair and dander: Secondary | ICD-10-CM | POA: Diagnosis not present

## 2018-09-15 DIAGNOSIS — J3089 Other allergic rhinitis: Secondary | ICD-10-CM | POA: Diagnosis not present

## 2018-09-16 DIAGNOSIS — Z1231 Encounter for screening mammogram for malignant neoplasm of breast: Secondary | ICD-10-CM | POA: Diagnosis not present

## 2018-09-16 DIAGNOSIS — Z6829 Body mass index (BMI) 29.0-29.9, adult: Secondary | ICD-10-CM | POA: Diagnosis not present

## 2018-09-16 DIAGNOSIS — Z01419 Encounter for gynecological examination (general) (routine) without abnormal findings: Secondary | ICD-10-CM | POA: Diagnosis not present

## 2018-09-24 DIAGNOSIS — J301 Allergic rhinitis due to pollen: Secondary | ICD-10-CM | POA: Diagnosis not present

## 2018-09-24 DIAGNOSIS — J3081 Allergic rhinitis due to animal (cat) (dog) hair and dander: Secondary | ICD-10-CM | POA: Diagnosis not present

## 2018-09-24 DIAGNOSIS — J3089 Other allergic rhinitis: Secondary | ICD-10-CM | POA: Diagnosis not present

## 2018-09-25 ENCOUNTER — Other Ambulatory Visit: Payer: Self-pay | Admitting: Obstetrics and Gynecology

## 2018-09-25 DIAGNOSIS — Z803 Family history of malignant neoplasm of breast: Secondary | ICD-10-CM

## 2018-09-29 DIAGNOSIS — Z85828 Personal history of other malignant neoplasm of skin: Secondary | ICD-10-CM | POA: Diagnosis not present

## 2018-09-29 DIAGNOSIS — L570 Actinic keratosis: Secondary | ICD-10-CM | POA: Diagnosis not present

## 2018-09-29 DIAGNOSIS — L821 Other seborrheic keratosis: Secondary | ICD-10-CM | POA: Diagnosis not present

## 2018-10-09 DIAGNOSIS — J3089 Other allergic rhinitis: Secondary | ICD-10-CM | POA: Diagnosis not present

## 2018-10-09 DIAGNOSIS — J3081 Allergic rhinitis due to animal (cat) (dog) hair and dander: Secondary | ICD-10-CM | POA: Diagnosis not present

## 2018-10-09 DIAGNOSIS — J301 Allergic rhinitis due to pollen: Secondary | ICD-10-CM | POA: Diagnosis not present

## 2018-10-20 ENCOUNTER — Other Ambulatory Visit: Payer: Self-pay

## 2018-10-23 DIAGNOSIS — J3089 Other allergic rhinitis: Secondary | ICD-10-CM | POA: Diagnosis not present

## 2018-10-23 DIAGNOSIS — J301 Allergic rhinitis due to pollen: Secondary | ICD-10-CM | POA: Diagnosis not present

## 2018-10-23 DIAGNOSIS — J3081 Allergic rhinitis due to animal (cat) (dog) hair and dander: Secondary | ICD-10-CM | POA: Diagnosis not present

## 2018-10-28 ENCOUNTER — Other Ambulatory Visit: Payer: Self-pay

## 2018-10-28 ENCOUNTER — Ambulatory Visit: Payer: Self-pay

## 2018-10-28 DIAGNOSIS — Z23 Encounter for immunization: Secondary | ICD-10-CM

## 2018-11-03 DIAGNOSIS — J301 Allergic rhinitis due to pollen: Secondary | ICD-10-CM | POA: Diagnosis not present

## 2018-11-03 DIAGNOSIS — J3081 Allergic rhinitis due to animal (cat) (dog) hair and dander: Secondary | ICD-10-CM | POA: Diagnosis not present

## 2018-11-03 DIAGNOSIS — J3089 Other allergic rhinitis: Secondary | ICD-10-CM | POA: Diagnosis not present

## 2018-11-06 ENCOUNTER — Other Ambulatory Visit: Payer: Self-pay | Admitting: Obstetrics and Gynecology

## 2018-11-06 ENCOUNTER — Other Ambulatory Visit: Payer: Self-pay

## 2018-11-06 ENCOUNTER — Ambulatory Visit
Admission: RE | Admit: 2018-11-06 | Discharge: 2018-11-06 | Disposition: A | Discharge status: Home / Self Care | Payer: BLUE CROSS/BLUE SHIELD | Source: Ambulatory Visit | Attending: Obstetrics and Gynecology | Admitting: Obstetrics and Gynecology

## 2018-11-06 DIAGNOSIS — Z803 Family history of malignant neoplasm of breast: Secondary | ICD-10-CM

## 2018-11-12 DIAGNOSIS — J301 Allergic rhinitis due to pollen: Secondary | ICD-10-CM | POA: Diagnosis not present

## 2018-11-12 DIAGNOSIS — J3081 Allergic rhinitis due to animal (cat) (dog) hair and dander: Secondary | ICD-10-CM | POA: Diagnosis not present

## 2018-11-12 DIAGNOSIS — J3089 Other allergic rhinitis: Secondary | ICD-10-CM | POA: Diagnosis not present

## 2018-11-20 DIAGNOSIS — J3081 Allergic rhinitis due to animal (cat) (dog) hair and dander: Secondary | ICD-10-CM | POA: Diagnosis not present

## 2018-11-20 DIAGNOSIS — J301 Allergic rhinitis due to pollen: Secondary | ICD-10-CM | POA: Diagnosis not present

## 2018-11-20 DIAGNOSIS — J3089 Other allergic rhinitis: Secondary | ICD-10-CM | POA: Diagnosis not present

## 2018-11-26 ENCOUNTER — Other Ambulatory Visit: Payer: Self-pay | Admitting: Internal Medicine

## 2018-12-01 DIAGNOSIS — M62838 Other muscle spasm: Secondary | ICD-10-CM | POA: Diagnosis not present

## 2018-12-01 DIAGNOSIS — N8110 Cystocele, unspecified: Secondary | ICD-10-CM | POA: Diagnosis not present

## 2018-12-01 DIAGNOSIS — M6281 Muscle weakness (generalized): Secondary | ICD-10-CM | POA: Diagnosis not present

## 2018-12-01 DIAGNOSIS — M6289 Other specified disorders of muscle: Secondary | ICD-10-CM | POA: Diagnosis not present

## 2018-12-03 DIAGNOSIS — J301 Allergic rhinitis due to pollen: Secondary | ICD-10-CM | POA: Diagnosis not present

## 2018-12-03 DIAGNOSIS — J3089 Other allergic rhinitis: Secondary | ICD-10-CM | POA: Diagnosis not present

## 2018-12-03 DIAGNOSIS — J3081 Allergic rhinitis due to animal (cat) (dog) hair and dander: Secondary | ICD-10-CM | POA: Diagnosis not present

## 2018-12-11 DIAGNOSIS — J3089 Other allergic rhinitis: Secondary | ICD-10-CM | POA: Diagnosis not present

## 2018-12-11 DIAGNOSIS — J3081 Allergic rhinitis due to animal (cat) (dog) hair and dander: Secondary | ICD-10-CM | POA: Diagnosis not present

## 2018-12-11 DIAGNOSIS — J301 Allergic rhinitis due to pollen: Secondary | ICD-10-CM | POA: Diagnosis not present

## 2018-12-17 DIAGNOSIS — J301 Allergic rhinitis due to pollen: Secondary | ICD-10-CM | POA: Diagnosis not present

## 2018-12-17 DIAGNOSIS — J3089 Other allergic rhinitis: Secondary | ICD-10-CM | POA: Diagnosis not present

## 2018-12-17 DIAGNOSIS — J3081 Allergic rhinitis due to animal (cat) (dog) hair and dander: Secondary | ICD-10-CM | POA: Diagnosis not present

## 2018-12-29 DIAGNOSIS — L821 Other seborrheic keratosis: Secondary | ICD-10-CM | POA: Diagnosis not present

## 2018-12-29 DIAGNOSIS — Z85828 Personal history of other malignant neoplasm of skin: Secondary | ICD-10-CM | POA: Diagnosis not present

## 2018-12-31 DIAGNOSIS — J301 Allergic rhinitis due to pollen: Secondary | ICD-10-CM | POA: Diagnosis not present

## 2018-12-31 DIAGNOSIS — J3089 Other allergic rhinitis: Secondary | ICD-10-CM | POA: Diagnosis not present

## 2018-12-31 DIAGNOSIS — J3081 Allergic rhinitis due to animal (cat) (dog) hair and dander: Secondary | ICD-10-CM | POA: Diagnosis not present

## 2019-01-12 DIAGNOSIS — J3081 Allergic rhinitis due to animal (cat) (dog) hair and dander: Secondary | ICD-10-CM | POA: Diagnosis not present

## 2019-01-12 DIAGNOSIS — J301 Allergic rhinitis due to pollen: Secondary | ICD-10-CM | POA: Diagnosis not present

## 2019-01-12 DIAGNOSIS — J3089 Other allergic rhinitis: Secondary | ICD-10-CM | POA: Diagnosis not present

## 2019-01-19 DIAGNOSIS — J301 Allergic rhinitis due to pollen: Secondary | ICD-10-CM | POA: Diagnosis not present

## 2019-01-19 DIAGNOSIS — J3089 Other allergic rhinitis: Secondary | ICD-10-CM | POA: Diagnosis not present

## 2019-01-19 DIAGNOSIS — J3081 Allergic rhinitis due to animal (cat) (dog) hair and dander: Secondary | ICD-10-CM | POA: Diagnosis not present

## 2019-01-26 DIAGNOSIS — J3089 Other allergic rhinitis: Secondary | ICD-10-CM | POA: Diagnosis not present

## 2019-01-26 DIAGNOSIS — J301 Allergic rhinitis due to pollen: Secondary | ICD-10-CM | POA: Diagnosis not present

## 2019-01-26 DIAGNOSIS — J3081 Allergic rhinitis due to animal (cat) (dog) hair and dander: Secondary | ICD-10-CM | POA: Diagnosis not present

## 2019-02-02 DIAGNOSIS — M62838 Other muscle spasm: Secondary | ICD-10-CM | POA: Diagnosis not present

## 2019-02-02 DIAGNOSIS — N3946 Mixed incontinence: Secondary | ICD-10-CM | POA: Diagnosis not present

## 2019-02-02 DIAGNOSIS — M6289 Other specified disorders of muscle: Secondary | ICD-10-CM | POA: Diagnosis not present

## 2019-02-02 DIAGNOSIS — M6281 Muscle weakness (generalized): Secondary | ICD-10-CM | POA: Diagnosis not present

## 2019-02-04 DIAGNOSIS — J3081 Allergic rhinitis due to animal (cat) (dog) hair and dander: Secondary | ICD-10-CM | POA: Diagnosis not present

## 2019-02-04 DIAGNOSIS — J3089 Other allergic rhinitis: Secondary | ICD-10-CM | POA: Diagnosis not present

## 2019-02-04 DIAGNOSIS — J301 Allergic rhinitis due to pollen: Secondary | ICD-10-CM | POA: Diagnosis not present

## 2019-02-11 DIAGNOSIS — J301 Allergic rhinitis due to pollen: Secondary | ICD-10-CM | POA: Diagnosis not present

## 2019-02-11 DIAGNOSIS — J4530 Mild persistent asthma, uncomplicated: Secondary | ICD-10-CM | POA: Diagnosis not present

## 2019-02-11 DIAGNOSIS — J3081 Allergic rhinitis due to animal (cat) (dog) hair and dander: Secondary | ICD-10-CM | POA: Diagnosis not present

## 2019-02-11 DIAGNOSIS — H1045 Other chronic allergic conjunctivitis: Secondary | ICD-10-CM | POA: Diagnosis not present

## 2019-02-11 DIAGNOSIS — J3089 Other allergic rhinitis: Secondary | ICD-10-CM | POA: Diagnosis not present

## 2019-02-18 DIAGNOSIS — J301 Allergic rhinitis due to pollen: Secondary | ICD-10-CM | POA: Diagnosis not present

## 2019-02-18 DIAGNOSIS — J3081 Allergic rhinitis due to animal (cat) (dog) hair and dander: Secondary | ICD-10-CM | POA: Diagnosis not present

## 2019-02-18 DIAGNOSIS — J3089 Other allergic rhinitis: Secondary | ICD-10-CM | POA: Diagnosis not present

## 2019-02-20 ENCOUNTER — Other Ambulatory Visit: Payer: Self-pay | Admitting: Internal Medicine

## 2019-02-23 DIAGNOSIS — J3089 Other allergic rhinitis: Secondary | ICD-10-CM | POA: Diagnosis not present

## 2019-02-23 DIAGNOSIS — J301 Allergic rhinitis due to pollen: Secondary | ICD-10-CM | POA: Diagnosis not present

## 2019-02-23 DIAGNOSIS — J3081 Allergic rhinitis due to animal (cat) (dog) hair and dander: Secondary | ICD-10-CM | POA: Diagnosis not present

## 2019-03-04 DIAGNOSIS — J3081 Allergic rhinitis due to animal (cat) (dog) hair and dander: Secondary | ICD-10-CM | POA: Diagnosis not present

## 2019-03-04 DIAGNOSIS — J301 Allergic rhinitis due to pollen: Secondary | ICD-10-CM | POA: Diagnosis not present

## 2019-03-04 DIAGNOSIS — J3089 Other allergic rhinitis: Secondary | ICD-10-CM | POA: Diagnosis not present

## 2019-03-09 DIAGNOSIS — J3081 Allergic rhinitis due to animal (cat) (dog) hair and dander: Secondary | ICD-10-CM | POA: Diagnosis not present

## 2019-03-09 DIAGNOSIS — J301 Allergic rhinitis due to pollen: Secondary | ICD-10-CM | POA: Diagnosis not present

## 2019-03-09 DIAGNOSIS — J3089 Other allergic rhinitis: Secondary | ICD-10-CM | POA: Diagnosis not present

## 2019-03-23 DIAGNOSIS — J3081 Allergic rhinitis due to animal (cat) (dog) hair and dander: Secondary | ICD-10-CM | POA: Diagnosis not present

## 2019-03-23 DIAGNOSIS — J3089 Other allergic rhinitis: Secondary | ICD-10-CM | POA: Diagnosis not present

## 2019-03-23 DIAGNOSIS — J301 Allergic rhinitis due to pollen: Secondary | ICD-10-CM | POA: Diagnosis not present

## 2019-04-02 DIAGNOSIS — J301 Allergic rhinitis due to pollen: Secondary | ICD-10-CM | POA: Diagnosis not present

## 2019-04-02 DIAGNOSIS — J3089 Other allergic rhinitis: Secondary | ICD-10-CM | POA: Diagnosis not present

## 2019-04-02 DIAGNOSIS — J3081 Allergic rhinitis due to animal (cat) (dog) hair and dander: Secondary | ICD-10-CM | POA: Diagnosis not present

## 2019-04-13 DIAGNOSIS — J3089 Other allergic rhinitis: Secondary | ICD-10-CM | POA: Diagnosis not present

## 2019-04-13 DIAGNOSIS — J3081 Allergic rhinitis due to animal (cat) (dog) hair and dander: Secondary | ICD-10-CM | POA: Diagnosis not present

## 2019-04-13 DIAGNOSIS — J301 Allergic rhinitis due to pollen: Secondary | ICD-10-CM | POA: Diagnosis not present

## 2019-04-20 DIAGNOSIS — J301 Allergic rhinitis due to pollen: Secondary | ICD-10-CM | POA: Diagnosis not present

## 2019-04-20 DIAGNOSIS — J3081 Allergic rhinitis due to animal (cat) (dog) hair and dander: Secondary | ICD-10-CM | POA: Diagnosis not present

## 2019-04-20 DIAGNOSIS — J3089 Other allergic rhinitis: Secondary | ICD-10-CM | POA: Diagnosis not present

## 2019-05-07 DIAGNOSIS — J3089 Other allergic rhinitis: Secondary | ICD-10-CM | POA: Diagnosis not present

## 2019-05-07 DIAGNOSIS — J301 Allergic rhinitis due to pollen: Secondary | ICD-10-CM | POA: Diagnosis not present

## 2019-05-07 DIAGNOSIS — J3081 Allergic rhinitis due to animal (cat) (dog) hair and dander: Secondary | ICD-10-CM | POA: Diagnosis not present

## 2019-05-14 DIAGNOSIS — J3089 Other allergic rhinitis: Secondary | ICD-10-CM | POA: Diagnosis not present

## 2019-05-14 DIAGNOSIS — J301 Allergic rhinitis due to pollen: Secondary | ICD-10-CM | POA: Diagnosis not present

## 2019-05-14 DIAGNOSIS — J3081 Allergic rhinitis due to animal (cat) (dog) hair and dander: Secondary | ICD-10-CM | POA: Diagnosis not present

## 2019-05-20 DIAGNOSIS — J301 Allergic rhinitis due to pollen: Secondary | ICD-10-CM | POA: Diagnosis not present

## 2019-05-20 DIAGNOSIS — J3089 Other allergic rhinitis: Secondary | ICD-10-CM | POA: Diagnosis not present

## 2019-05-20 DIAGNOSIS — J3081 Allergic rhinitis due to animal (cat) (dog) hair and dander: Secondary | ICD-10-CM | POA: Diagnosis not present

## 2019-05-28 DIAGNOSIS — J3089 Other allergic rhinitis: Secondary | ICD-10-CM | POA: Diagnosis not present

## 2019-05-28 DIAGNOSIS — J3081 Allergic rhinitis due to animal (cat) (dog) hair and dander: Secondary | ICD-10-CM | POA: Diagnosis not present

## 2019-05-28 DIAGNOSIS — J301 Allergic rhinitis due to pollen: Secondary | ICD-10-CM | POA: Diagnosis not present

## 2019-06-03 DIAGNOSIS — J3081 Allergic rhinitis due to animal (cat) (dog) hair and dander: Secondary | ICD-10-CM | POA: Diagnosis not present

## 2019-06-03 DIAGNOSIS — J301 Allergic rhinitis due to pollen: Secondary | ICD-10-CM | POA: Diagnosis not present

## 2019-06-03 DIAGNOSIS — J3089 Other allergic rhinitis: Secondary | ICD-10-CM | POA: Diagnosis not present

## 2019-06-09 DIAGNOSIS — J301 Allergic rhinitis due to pollen: Secondary | ICD-10-CM | POA: Diagnosis not present

## 2019-06-10 DIAGNOSIS — J3081 Allergic rhinitis due to animal (cat) (dog) hair and dander: Secondary | ICD-10-CM | POA: Diagnosis not present

## 2019-06-10 DIAGNOSIS — J3089 Other allergic rhinitis: Secondary | ICD-10-CM | POA: Diagnosis not present

## 2019-06-18 DIAGNOSIS — J3081 Allergic rhinitis due to animal (cat) (dog) hair and dander: Secondary | ICD-10-CM | POA: Diagnosis not present

## 2019-06-18 DIAGNOSIS — J301 Allergic rhinitis due to pollen: Secondary | ICD-10-CM | POA: Diagnosis not present

## 2019-06-18 DIAGNOSIS — J3089 Other allergic rhinitis: Secondary | ICD-10-CM | POA: Diagnosis not present

## 2019-06-24 DIAGNOSIS — J3089 Other allergic rhinitis: Secondary | ICD-10-CM | POA: Diagnosis not present

## 2019-06-24 DIAGNOSIS — J301 Allergic rhinitis due to pollen: Secondary | ICD-10-CM | POA: Diagnosis not present

## 2019-06-24 DIAGNOSIS — J3081 Allergic rhinitis due to animal (cat) (dog) hair and dander: Secondary | ICD-10-CM | POA: Diagnosis not present

## 2019-08-10 DIAGNOSIS — J301 Allergic rhinitis due to pollen: Secondary | ICD-10-CM | POA: Diagnosis not present

## 2019-08-10 DIAGNOSIS — J3081 Allergic rhinitis due to animal (cat) (dog) hair and dander: Secondary | ICD-10-CM | POA: Diagnosis not present

## 2019-08-10 DIAGNOSIS — J3089 Other allergic rhinitis: Secondary | ICD-10-CM | POA: Diagnosis not present

## 2019-08-11 DIAGNOSIS — D2262 Melanocytic nevi of left upper limb, including shoulder: Secondary | ICD-10-CM | POA: Diagnosis not present

## 2019-08-11 DIAGNOSIS — D225 Melanocytic nevi of trunk: Secondary | ICD-10-CM | POA: Diagnosis not present

## 2019-08-11 DIAGNOSIS — L821 Other seborrheic keratosis: Secondary | ICD-10-CM | POA: Diagnosis not present

## 2019-08-11 DIAGNOSIS — D2261 Melanocytic nevi of right upper limb, including shoulder: Secondary | ICD-10-CM | POA: Diagnosis not present

## 2019-08-11 DIAGNOSIS — L570 Actinic keratosis: Secondary | ICD-10-CM | POA: Diagnosis not present

## 2019-08-17 DIAGNOSIS — J3081 Allergic rhinitis due to animal (cat) (dog) hair and dander: Secondary | ICD-10-CM | POA: Diagnosis not present

## 2019-08-17 DIAGNOSIS — J301 Allergic rhinitis due to pollen: Secondary | ICD-10-CM | POA: Diagnosis not present

## 2019-08-17 DIAGNOSIS — J3089 Other allergic rhinitis: Secondary | ICD-10-CM | POA: Diagnosis not present

## 2019-08-24 DIAGNOSIS — J301 Allergic rhinitis due to pollen: Secondary | ICD-10-CM | POA: Diagnosis not present

## 2019-08-24 DIAGNOSIS — J3089 Other allergic rhinitis: Secondary | ICD-10-CM | POA: Diagnosis not present

## 2019-08-24 DIAGNOSIS — J3081 Allergic rhinitis due to animal (cat) (dog) hair and dander: Secondary | ICD-10-CM | POA: Diagnosis not present

## 2019-08-31 DIAGNOSIS — J3089 Other allergic rhinitis: Secondary | ICD-10-CM | POA: Diagnosis not present

## 2019-08-31 DIAGNOSIS — J301 Allergic rhinitis due to pollen: Secondary | ICD-10-CM | POA: Diagnosis not present

## 2019-08-31 DIAGNOSIS — J3081 Allergic rhinitis due to animal (cat) (dog) hair and dander: Secondary | ICD-10-CM | POA: Diagnosis not present

## 2019-09-07 DIAGNOSIS — J3081 Allergic rhinitis due to animal (cat) (dog) hair and dander: Secondary | ICD-10-CM | POA: Diagnosis not present

## 2019-09-07 DIAGNOSIS — J301 Allergic rhinitis due to pollen: Secondary | ICD-10-CM | POA: Diagnosis not present

## 2019-09-07 DIAGNOSIS — J3089 Other allergic rhinitis: Secondary | ICD-10-CM | POA: Diagnosis not present

## 2019-09-14 DIAGNOSIS — J301 Allergic rhinitis due to pollen: Secondary | ICD-10-CM | POA: Diagnosis not present

## 2019-09-14 DIAGNOSIS — J3089 Other allergic rhinitis: Secondary | ICD-10-CM | POA: Diagnosis not present

## 2019-09-14 DIAGNOSIS — J3081 Allergic rhinitis due to animal (cat) (dog) hair and dander: Secondary | ICD-10-CM | POA: Diagnosis not present

## 2019-09-23 DIAGNOSIS — J3089 Other allergic rhinitis: Secondary | ICD-10-CM | POA: Diagnosis not present

## 2019-09-23 DIAGNOSIS — J301 Allergic rhinitis due to pollen: Secondary | ICD-10-CM | POA: Diagnosis not present

## 2019-09-23 DIAGNOSIS — J3081 Allergic rhinitis due to animal (cat) (dog) hair and dander: Secondary | ICD-10-CM | POA: Diagnosis not present

## 2019-09-30 DIAGNOSIS — J3081 Allergic rhinitis due to animal (cat) (dog) hair and dander: Secondary | ICD-10-CM | POA: Diagnosis not present

## 2019-09-30 DIAGNOSIS — J3089 Other allergic rhinitis: Secondary | ICD-10-CM | POA: Diagnosis not present

## 2019-09-30 DIAGNOSIS — J301 Allergic rhinitis due to pollen: Secondary | ICD-10-CM | POA: Diagnosis not present

## 2019-10-07 DIAGNOSIS — J301 Allergic rhinitis due to pollen: Secondary | ICD-10-CM | POA: Diagnosis not present

## 2019-10-07 DIAGNOSIS — J3081 Allergic rhinitis due to animal (cat) (dog) hair and dander: Secondary | ICD-10-CM | POA: Diagnosis not present

## 2019-10-07 DIAGNOSIS — J3089 Other allergic rhinitis: Secondary | ICD-10-CM | POA: Diagnosis not present

## 2019-10-11 DIAGNOSIS — Z13820 Encounter for screening for osteoporosis: Secondary | ICD-10-CM | POA: Diagnosis not present

## 2019-10-11 DIAGNOSIS — Z01419 Encounter for gynecological examination (general) (routine) without abnormal findings: Secondary | ICD-10-CM | POA: Diagnosis not present

## 2019-10-11 DIAGNOSIS — Z1231 Encounter for screening mammogram for malignant neoplasm of breast: Secondary | ICD-10-CM | POA: Diagnosis not present

## 2019-10-11 DIAGNOSIS — Z6829 Body mass index (BMI) 29.0-29.9, adult: Secondary | ICD-10-CM | POA: Diagnosis not present

## 2019-10-13 ENCOUNTER — Other Ambulatory Visit: Payer: Self-pay | Admitting: Internal Medicine

## 2019-10-15 DIAGNOSIS — J3089 Other allergic rhinitis: Secondary | ICD-10-CM | POA: Diagnosis not present

## 2019-10-15 DIAGNOSIS — J3081 Allergic rhinitis due to animal (cat) (dog) hair and dander: Secondary | ICD-10-CM | POA: Diagnosis not present

## 2019-10-15 DIAGNOSIS — J301 Allergic rhinitis due to pollen: Secondary | ICD-10-CM | POA: Diagnosis not present

## 2019-10-21 DIAGNOSIS — J301 Allergic rhinitis due to pollen: Secondary | ICD-10-CM | POA: Diagnosis not present

## 2019-10-21 DIAGNOSIS — J3089 Other allergic rhinitis: Secondary | ICD-10-CM | POA: Diagnosis not present

## 2019-10-21 DIAGNOSIS — J3081 Allergic rhinitis due to animal (cat) (dog) hair and dander: Secondary | ICD-10-CM | POA: Diagnosis not present

## 2019-10-29 DIAGNOSIS — J3081 Allergic rhinitis due to animal (cat) (dog) hair and dander: Secondary | ICD-10-CM | POA: Diagnosis not present

## 2019-10-29 DIAGNOSIS — J301 Allergic rhinitis due to pollen: Secondary | ICD-10-CM | POA: Diagnosis not present

## 2019-10-29 DIAGNOSIS — J3089 Other allergic rhinitis: Secondary | ICD-10-CM | POA: Diagnosis not present

## 2019-11-05 DIAGNOSIS — J301 Allergic rhinitis due to pollen: Secondary | ICD-10-CM | POA: Diagnosis not present

## 2019-11-05 DIAGNOSIS — J3089 Other allergic rhinitis: Secondary | ICD-10-CM | POA: Diagnosis not present

## 2019-11-05 DIAGNOSIS — J3081 Allergic rhinitis due to animal (cat) (dog) hair and dander: Secondary | ICD-10-CM | POA: Diagnosis not present

## 2019-11-19 DIAGNOSIS — J3089 Other allergic rhinitis: Secondary | ICD-10-CM | POA: Diagnosis not present

## 2019-11-19 DIAGNOSIS — J3081 Allergic rhinitis due to animal (cat) (dog) hair and dander: Secondary | ICD-10-CM | POA: Diagnosis not present

## 2019-11-19 DIAGNOSIS — J301 Allergic rhinitis due to pollen: Secondary | ICD-10-CM | POA: Diagnosis not present

## 2019-11-25 DIAGNOSIS — J301 Allergic rhinitis due to pollen: Secondary | ICD-10-CM | POA: Diagnosis not present

## 2019-11-25 DIAGNOSIS — J3089 Other allergic rhinitis: Secondary | ICD-10-CM | POA: Diagnosis not present

## 2019-11-25 DIAGNOSIS — J3081 Allergic rhinitis due to animal (cat) (dog) hair and dander: Secondary | ICD-10-CM | POA: Diagnosis not present

## 2019-12-03 DIAGNOSIS — J3089 Other allergic rhinitis: Secondary | ICD-10-CM | POA: Diagnosis not present

## 2019-12-03 DIAGNOSIS — J3081 Allergic rhinitis due to animal (cat) (dog) hair and dander: Secondary | ICD-10-CM | POA: Diagnosis not present

## 2019-12-03 DIAGNOSIS — J301 Allergic rhinitis due to pollen: Secondary | ICD-10-CM | POA: Diagnosis not present

## 2019-12-10 DIAGNOSIS — J3081 Allergic rhinitis due to animal (cat) (dog) hair and dander: Secondary | ICD-10-CM | POA: Diagnosis not present

## 2019-12-10 DIAGNOSIS — J3089 Other allergic rhinitis: Secondary | ICD-10-CM | POA: Diagnosis not present

## 2019-12-10 DIAGNOSIS — J301 Allergic rhinitis due to pollen: Secondary | ICD-10-CM | POA: Diagnosis not present

## 2019-12-17 DIAGNOSIS — J3081 Allergic rhinitis due to animal (cat) (dog) hair and dander: Secondary | ICD-10-CM | POA: Diagnosis not present

## 2019-12-17 DIAGNOSIS — J3089 Other allergic rhinitis: Secondary | ICD-10-CM | POA: Diagnosis not present

## 2019-12-17 DIAGNOSIS — J301 Allergic rhinitis due to pollen: Secondary | ICD-10-CM | POA: Diagnosis not present

## 2019-12-28 DIAGNOSIS — J3081 Allergic rhinitis due to animal (cat) (dog) hair and dander: Secondary | ICD-10-CM | POA: Diagnosis not present

## 2019-12-28 DIAGNOSIS — J3089 Other allergic rhinitis: Secondary | ICD-10-CM | POA: Diagnosis not present

## 2019-12-28 DIAGNOSIS — J301 Allergic rhinitis due to pollen: Secondary | ICD-10-CM | POA: Diagnosis not present

## 2020-01-11 DIAGNOSIS — J301 Allergic rhinitis due to pollen: Secondary | ICD-10-CM | POA: Diagnosis not present

## 2020-01-11 DIAGNOSIS — J3081 Allergic rhinitis due to animal (cat) (dog) hair and dander: Secondary | ICD-10-CM | POA: Diagnosis not present

## 2020-01-11 DIAGNOSIS — J3089 Other allergic rhinitis: Secondary | ICD-10-CM | POA: Diagnosis not present

## 2020-01-25 ENCOUNTER — Telehealth: Payer: Self-pay | Admitting: Nurse Practitioner

## 2020-01-25 ENCOUNTER — Ambulatory Visit: Payer: Self-pay | Admitting: Nurse Practitioner

## 2020-01-25 ENCOUNTER — Other Ambulatory Visit: Payer: Self-pay

## 2020-01-25 DIAGNOSIS — J029 Acute pharyngitis, unspecified: Secondary | ICD-10-CM

## 2020-01-25 DIAGNOSIS — Z20822 Contact with and (suspected) exposure to covid-19: Secondary | ICD-10-CM

## 2020-01-25 LAB — POCT RAPID STREP A (OFFICE): Rapid Strep A Screen: NEGATIVE

## 2020-01-25 LAB — POC COVID19 BINAXNOW: SARS Coronavirus 2 Ag: NEGATIVE

## 2020-01-25 NOTE — Progress Notes (Signed)
   Subjective:    Patient ID: Shannon [name], female    DOB: [birth year], 54 y.o.   MRN: [id number]  HPI  54 year old female presenting with sore throat and fever fatigue and headache that started yesterday 01/24/20  She is concerned about strep   Had COVID booster last week and has had axillar lymph node enlargement after that.   Fever 101 last night   Review of Systems  Constitutional: Positive for fever.  HENT: Positive for sore throat.   Respiratory: Negative.   Genitourinary: Negative.   Neurological: Negative.        Objective:   Physical Exam  This was a telehealth appointment with patient after nurse visit for strep and covid testing   No acute distress during phone conversation with patient   Recent Results (from the past 2160 hour(s))  POC COVID-19     Status: Normal   Collection Time: 01/25/20  1:44 PM  Result Value Ref Range   SARS Coronavirus 2 Ag Negative Negative    Comment: Patient vaccinated. Patientaware of negative result. Patient virtual follow up with S.Yeslin Delio  POCT rapid strep A     Status: Normal   Collection Time: 01/25/20  1:47 PM  Result Value Ref Range   Rapid Strep A Screen Negative Negative    Comment: Patient vaccinated. Patient aware of negative result. Patient virtual follow up with S.Esha Fincher      Assessment & Plan:  Advised continuing to manage symptoms with OTC medications   Will follow up with PCR results when available   Discussed post vaccine status and encouraged immune system support with vitamin C Push fluids and rest.   If symptoms persist or worsen return to clinic for follow up as discussed

## 2020-01-27 ENCOUNTER — Encounter: Payer: Self-pay | Admitting: Nurse Practitioner

## 2020-01-27 LAB — NOVEL CORONAVIRUS, NAA: SARS-CoV-2, NAA: NOT DETECTED

## 2020-01-27 LAB — SARS-COV-2, NAA 2 DAY TAT

## 2020-01-30 ENCOUNTER — Other Ambulatory Visit: Payer: Self-pay | Admitting: Nurse Practitioner

## 2020-01-30 DIAGNOSIS — J324 Chronic pansinusitis: Secondary | ICD-10-CM

## 2020-01-30 MED ORDER — AMOXICILLIN-POT CLAVULANATE 875-125 MG PO TABS: 1.0000 | ORAL_TABLET | Freq: Two times a day (BID) | ORAL | 0 refills | Status: AC

## 2020-01-30 NOTE — Progress Notes (Signed)
F/u with patient symptoms persisting congestion mainly in sinuses patient is concerned now for sinus infection.  Rapid Flu and COVID negative in clinic last week.   PCR also negative for COVID- message from patient that symptoms are persisting will cover with antibiotic for secondary infection.   Requested patient follow up in clinic next week if symptoms persist or worsen on antibiotic.   Recent Results (from the past 2160 hour(s))  POC COVID-19     Status: Normal   Collection Time: 01/25/20  1:44 PM  Result Value Ref Range   SARS Coronavirus 2 Ag Negative Negative    Comment: Patient vaccinated. Patientaware of negative result. Patient virtual follow up with S.Anjelique Makar  POCT rapid strep A     Status: Normal   Collection Time: 01/25/20  1:47 PM  Result Value Ref Range   Rapid Strep A Screen Negative Negative    Comment: Patient vaccinated. Patient aware of negative result. Patient virtual follow up with S.Aubreanna Percle  Novel Coronavirus, NAA ([facility])     Status: None   Collection Time: 01/25/20  1:52 PM   Specimen: Nasopharyngeal(NP) swabs in vial transport medium   Nasopharynge  Result Value Ref Range   SARS-CoV-2, NAA Not Detected Not Detected    Comment: This nucleic acid amplification test was developed and its performance characteristics determined by [employer]. Nucleic acid amplification tests include RT-PCR and TMA. This test has not been FDA cleared or approved. This test has been authorized by FDA under an Emergency Use Authorization (EUA). This test is only authorized for the duration of time the declaration that circumstances exist justifying the authorization of the emergency use of in vitro diagnostic tests for detection of SARS-CoV-2 virus and/or diagnosis of COVID-19 infection under section 564(b)(1) of the Act, 21 U.S.C. 277OEU-2(P) (1), unless the authorization is terminated or revoked sooner. When diagnostic testing is negative, the possibility of a  false negative result should be considered in the context of a patient's recent exposures and the presence of clinical signs and symptoms consistent with COVID-19. An individual without symptoms of COVID-19 and who is not shedding SARS-CoV-2 virus wo uld expect to have a negative (not detected) result in this assay.   SARS-COV-2, NAA 2 DAY TAT     Status: None   Collection Time: 01/25/20  1:52 PM   Nasopharynge  Result Value Ref Range   SARS-CoV-2, NAA 2 DAY TAT Performed

## 2020-02-03 DIAGNOSIS — J3081 Allergic rhinitis due to animal (cat) (dog) hair and dander: Secondary | ICD-10-CM | POA: Diagnosis not present

## 2020-02-03 DIAGNOSIS — J301 Allergic rhinitis due to pollen: Secondary | ICD-10-CM | POA: Diagnosis not present

## 2020-02-03 DIAGNOSIS — J3089 Other allergic rhinitis: Secondary | ICD-10-CM | POA: Diagnosis not present

## 2020-02-09 ENCOUNTER — Other Ambulatory Visit (INDEPENDENT_AMBULATORY_CARE_PROVIDER_SITE_OTHER): Payer: Self-pay

## 2020-02-09 ENCOUNTER — Other Ambulatory Visit: Payer: Self-pay

## 2020-02-09 ENCOUNTER — Ambulatory Visit
Admission: RE | Admit: 2020-02-09 | Discharge: 2020-02-09 | Disposition: A | Discharge status: Home / Self Care | Payer: Self-pay | Source: Ambulatory Visit | Attending: Family | Admitting: Family

## 2020-02-09 ENCOUNTER — Telehealth (INDEPENDENT_AMBULATORY_CARE_PROVIDER_SITE_OTHER): Payer: BLUE CROSS/BLUE SHIELD | Admitting: Family

## 2020-02-09 ENCOUNTER — Ambulatory Visit: Payer: Self-pay

## 2020-02-09 ENCOUNTER — Other Ambulatory Visit: Payer: Self-pay | Admitting: Family

## 2020-02-09 ENCOUNTER — Ambulatory Visit (INDEPENDENT_AMBULATORY_CARE_PROVIDER_SITE_OTHER)
Admission: RE | Admit: 2020-02-09 | Discharge: 2020-02-09 | Disposition: A | Discharge status: Home / Self Care | Payer: Self-pay | Source: Ambulatory Visit | Attending: Family | Admitting: Family

## 2020-02-09 DIAGNOSIS — R059 Cough, unspecified: Secondary | ICD-10-CM

## 2020-02-09 DIAGNOSIS — R062 Wheezing: Secondary | ICD-10-CM | POA: Diagnosis not present

## 2020-02-09 LAB — CBC
HCT: 35.9 % — ABNORMAL LOW (ref 36.0–46.0)
Hemoglobin: 12.6 g/dL (ref 12.0–15.0)
MCHC: 35.1 g/dL (ref 30.0–36.0)
MCV: 91.3 fl (ref 78.0–100.0)
Platelets: 286 10*3/uL (ref 150.0–400.0)
RBC: 3.93 Mil/uL (ref 3.87–5.11)
RDW: 12.3 % (ref 11.5–15.5)
WBC: 12 10*3/uL — ABNORMAL HIGH (ref 4.0–10.5)

## 2020-02-09 MED ORDER — METHYLPREDNISOLONE ACETATE 40 MG/ML IJ SUSP
40.0000 mg | Freq: Once | INTRAMUSCULAR | Status: AC
  Administered 2020-02-09: 40 mg via INTRAMUSCULAR

## 2020-02-09 MED ORDER — DOXYCYCLINE HYCLATE 100 MG PO TABS: 100.0000 mg | ORAL_TABLET | Freq: Two times a day (BID) | ORAL | 0 refills | Status: DC

## 2020-02-09 NOTE — Progress Notes (Signed)
Methylprednisolone 40mg  given per Mindi Slicker, FNP. Medication given IM in right ventrogluteal & pt tolerated well.

## 2020-02-09 NOTE — Telephone Encounter (Signed)
Forwarding to [place].. since she saw pt this am../lmb

## 2020-02-09 NOTE — Progress Notes (Signed)
Shannon [name] is a 55 y.o. female with the following history as recorded in EpicCare:  Patient Active Problem List   Diagnosis Date Noted  . Fatigue 02/25/2018  . Insomnia 02/25/2018  . B12 deficiency 02/25/2018  . Paresthesia 11/10/2012  . Well adult exam 10/05/2010  . Obesity 10/05/2010  . Asthma 01/12/2008  . DEPRESSION 11/17/2007  . Allergic rhinitis 11/17/2007  . GERD 11/17/2007    Current Outpatient Medications  Medication Sig Dispense Refill  . doxycycline (VIBRA-TABS) 100 MG tablet Take 1 tablet (100 mg total) by mouth 2 (two) times daily. 20 tablet 0  . albuterol (PROAIR HFA) 108 (90 Base) MCG/ACT inhaler Inhale 2 puffs into the lungs every 4 (four) hours as needed for wheezing or shortness of breath. 1 Inhaler 5  . ALPRAZolam (XANAX) 0.5 MG tablet TAKE 1 TABLET BY MOUTH AT BEDTIME AS NEEDED FOR SLEEP 30 tablet 1  . azelastine (ASTELIN) 0.1 % nasal spray Place 1 spray into both nostrils daily. Use in each nostril as directed    . Cholecalciferol (VITAMIN D3) 50 MCG (2000 UT) capsule Take 1 capsule (2,000 Units total) by mouth daily. 100 capsule 3  . loratadine (CLARITIN) 10 MG tablet Take 1 tablet (10 mg total) by mouth daily. 90 tablet 3  . montelukast (SINGULAIR) 10 MG tablet Take 1 tablet (10 mg total) by mouth daily. 90 tablet 3   Current Facility-Administered Medications  Medication Dose Route Frequency Provider Last Rate Last Admin  . methylPREDNISolone acetate (DEPO-MEDROL) injection 40 mg  40 mg Intramuscular Once Marrian Salvage, FNP        Allergies: Augmentin [amoxicillin-pot clavulanate] and Moxifloxacin  Past Medical History:  Diagnosis Date  . GERD (gastroesophageal reflux disease)     Past Surgical History:  Procedure Laterality Date  . None      Family History  Problem Relation Age of Onset  . Healthy Mother   . Healthy Father   . Depression Daughter     Social History   Tobacco Use  . Smoking status: Never Smoker  . Smokeless  tobacco: Never Used  Substance Use Topics  . Alcohol use: Yes    Comment: Occasionally drink wine (1x per week)    Subjective:    I connected with Shannon [name] on 02/09/20 at 11:00 AM EST by a video enabled telemedicine application and verified that I am speaking with the correct person using two identifiers.   I discussed the limitations of evaluation and management by telemedicine and the availability of in person appointments. The patient expressed understanding and agreed to proceed.  Patient was diagnosed with possible sinus infection on 01/30/20 through clinic at [facility] where she works; was given 10 days of Augmentin but unable to tolerate the medicine- did not actually take; had her COVID booster on 12/20- has had numerous negative COVID tests in the interim. Continuing to experience cough/ chest congestion/ shortness of breath- concerned for possible pneumonia;      Objective:  There were no vitals filed for this visit.  General: Well developed, well nourished, in no acute distress  Head: Normocephalic and atraumatic  Lungs: Respirations unlabored;  Neurologic: Alert and oriented; speech intact; face symmetrical;   Assessment:  1. Cough     Plan:  Concern for possible pneumonia; recent negative COVID test as of yesterday; will update CXR and CBC today; she will go to our office for Depo-Medrol injection as she does not tolerate oral prednisone; add Augmentin as an allergy and start  on Doxcycline 100 mg bid x 10 days; increase fluids, rest and follow up to be determined.  Time spent discussing treatment and coordinating care 30 minutes  No follow-ups on file.  Orders Placed This Encounter  Procedures  . DG Chest 2 View    Standing Status:   Future    Standing Expiration Date:   02/08/2021    Order Specific Question:   Reason for Exam (SYMPTOM  OR DIAGNOSIS REQUIRED)    Answer:   cough    Order Specific Question:   Is patient pregnant?    Answer:   No    Order  Specific Question:   Preferred imaging location?    Answer:   [facility]  . CBC    Standing Status:   Future    Number of Occurrences:   1    Standing Expiration Date:   02/08/2021    Requested Prescriptions   Signed Prescriptions Disp Refills  . doxycycline (VIBRA-TABS) 100 MG tablet 20 tablet 0    Sig: Take 1 tablet (100 mg total) by mouth 2 (two) times daily.

## 2020-02-10 ENCOUNTER — Other Ambulatory Visit: Payer: Self-pay | Admitting: Family

## 2020-02-10 DIAGNOSIS — J9801 Acute bronchospasm: Secondary | ICD-10-CM

## 2020-02-10 DIAGNOSIS — R6889 Other general symptoms and signs: Secondary | ICD-10-CM

## 2020-02-10 MED ORDER — HYDROCODONE-HOMATROPINE 5-1.5 MG/5ML PO SYRP
5.0000 mL | ORAL_SOLUTION | Freq: Four times a day (QID) | ORAL | 0 refills | Status: DC | PRN
Start: 2020-02-10 — End: 2020-02-22

## 2020-02-15 DIAGNOSIS — L821 Other seborrheic keratosis: Secondary | ICD-10-CM | POA: Diagnosis not present

## 2020-02-15 DIAGNOSIS — D2261 Melanocytic nevi of right upper limb, including shoulder: Secondary | ICD-10-CM | POA: Diagnosis not present

## 2020-02-15 DIAGNOSIS — D2271 Melanocytic nevi of right lower limb, including hip: Secondary | ICD-10-CM | POA: Diagnosis not present

## 2020-02-15 DIAGNOSIS — B070 Plantar wart: Secondary | ICD-10-CM | POA: Diagnosis not present

## 2020-02-15 DIAGNOSIS — D225 Melanocytic nevi of trunk: Secondary | ICD-10-CM | POA: Diagnosis not present

## 2020-02-17 DIAGNOSIS — J3081 Allergic rhinitis due to animal (cat) (dog) hair and dander: Secondary | ICD-10-CM | POA: Diagnosis not present

## 2020-02-17 DIAGNOSIS — J301 Allergic rhinitis due to pollen: Secondary | ICD-10-CM | POA: Diagnosis not present

## 2020-02-17 DIAGNOSIS — J3089 Other allergic rhinitis: Secondary | ICD-10-CM | POA: Diagnosis not present

## 2020-02-21 ENCOUNTER — Other Ambulatory Visit: Payer: Self-pay | Admitting: Internal Medicine

## 2020-02-21 DIAGNOSIS — Z0000 Encounter for general adult medical examination without abnormal findings: Secondary | ICD-10-CM

## 2020-02-21 DIAGNOSIS — E538 Deficiency of other specified B group vitamins: Secondary | ICD-10-CM

## 2020-02-22 ENCOUNTER — Encounter: Payer: Self-pay | Admitting: Internal Medicine

## 2020-02-22 ENCOUNTER — Other Ambulatory Visit: Payer: Self-pay

## 2020-02-22 ENCOUNTER — Other Ambulatory Visit (INDEPENDENT_AMBULATORY_CARE_PROVIDER_SITE_OTHER): Payer: BC Managed Care – PPO

## 2020-02-22 ENCOUNTER — Ambulatory Visit (INDEPENDENT_AMBULATORY_CARE_PROVIDER_SITE_OTHER): Payer: BC Managed Care – PPO | Admitting: Internal Medicine

## 2020-02-22 VITALS — BP 122/60 | HR 87 | Temp 98.3°F | Ht 63.0 in | Wt 166.0 lb

## 2020-02-22 DIAGNOSIS — N3090 Cystitis, unspecified without hematuria: Secondary | ICD-10-CM

## 2020-02-22 DIAGNOSIS — J4521 Mild intermittent asthma with (acute) exacerbation: Secondary | ICD-10-CM

## 2020-02-22 DIAGNOSIS — Z0000 Encounter for general adult medical examination without abnormal findings: Secondary | ICD-10-CM

## 2020-02-22 DIAGNOSIS — R6889 Other general symptoms and signs: Secondary | ICD-10-CM

## 2020-02-22 DIAGNOSIS — E785 Hyperlipidemia, unspecified: Secondary | ICD-10-CM

## 2020-02-22 DIAGNOSIS — E538 Deficiency of other specified B group vitamins: Secondary | ICD-10-CM

## 2020-02-22 DIAGNOSIS — J9801 Acute bronchospasm: Secondary | ICD-10-CM

## 2020-02-22 DIAGNOSIS — E669 Obesity, unspecified: Secondary | ICD-10-CM | POA: Diagnosis not present

## 2020-02-22 LAB — COMPREHENSIVE METABOLIC PANEL
ALT: 14 U/L (ref 0–35)
AST: 12 U/L (ref 0–37)
Albumin: 4.4 g/dL (ref 3.5–5.2)
Alkaline Phosphatase: 68 U/L (ref 39–117)
BUN: 19 mg/dL (ref 6–23)
CO2: 26 mEq/L (ref 19–32)
Calcium: 9.7 mg/dL (ref 8.4–10.5)
Chloride: 105 mEq/L (ref 96–112)
Creatinine, Ser: 0.62 mg/dL (ref 0.40–1.20)
GFR: 102.09 mL/min (ref >60.00)
Glucose, Bld: 95 mg/dL (ref 70–99)
Potassium: 4 mEq/L (ref 3.5–5.1)
Sodium: 139 mEq/L (ref 135–145)
Total Bilirubin: 0.5 mg/dL (ref 0.2–1.2)
Total Protein: 7 g/dL (ref 6.0–8.3)

## 2020-02-22 LAB — CBC WITH DIFFERENTIAL/PLATELET
Basophils Absolute: 0 10*3/uL (ref 0.0–0.1)
Basophils Relative: 0.7 % (ref 0.0–3.0)
Eosinophils Absolute: 0.2 10*3/uL (ref 0.0–0.7)
Eosinophils Relative: 2.7 % (ref 0.0–5.0)
HCT: 36.3 % (ref 36.0–46.0)
Hemoglobin: 12.7 g/dL (ref 12.0–15.0)
Lymphocytes Relative: 32.2 % (ref 12.0–46.0)
Lymphs Abs: 2.3 10*3/uL (ref 0.7–4.0)
MCHC: 34.9 g/dL (ref 30.0–36.0)
MCV: 92 fl (ref 78.0–100.0)
Monocytes Absolute: 0.5 10*3/uL (ref 0.1–1.0)
Monocytes Relative: 7.3 % (ref 3.0–12.0)
Neutro Abs: 4 10*3/uL (ref 1.4–7.7)
Neutrophils Relative %: 57.1 % (ref 43.0–77.0)
Platelets: 279 10*3/uL (ref 150.0–400.0)
RBC: 3.95 Mil/uL (ref 3.87–5.11)
RDW: 12.4 % (ref 11.5–15.5)
WBC: 7 10*3/uL (ref 4.0–10.5)

## 2020-02-22 LAB — LIPID PANEL
Cholesterol: 227 mg/dL — ABNORMAL HIGH (ref 0–200)
HDL: 61.6 mg/dL (ref >39.00)
LDL Cholesterol: 145 mg/dL — ABNORMAL HIGH (ref 0–99)
NonHDL: 165.7
Total CHOL/HDL Ratio: 4
Triglycerides: 103 mg/dL (ref 0.0–149.0)
VLDL: 20.6 mg/dL (ref 0.0–40.0)

## 2020-02-22 LAB — URINALYSIS, ROUTINE W REFLEX MICROSCOPIC
Bilirubin Urine: NEGATIVE
Ketones, ur: NEGATIVE
Nitrite: NEGATIVE
Specific Gravity, Urine: >=1.03 — AB (ref 1.000–1.030)
Total Protein, Urine: NEGATIVE
Urine Glucose: NEGATIVE
Urobilinogen, UA: 0.2 (ref 0.0–1.0)
pH: 5.5 (ref 5.0–8.0)

## 2020-02-22 LAB — VITAMIN B12: Vitamin B-12: 623 pg/mL (ref 211–911)

## 2020-02-22 LAB — TSH: TSH: 1.05 u[IU]/mL (ref 0.35–4.50)

## 2020-02-22 MED ORDER — METHYLPREDNISOLONE 4 MG PO TBPK: ORAL_TABLET | ORAL | 0 refills | Status: DC

## 2020-02-22 MED ORDER — FLUCONAZOLE 150 MG PO TABS: 150.0000 mg | ORAL_TABLET | Freq: Once | ORAL | 1 refills | Status: AC

## 2020-02-22 MED ORDER — CEFUROXIME AXETIL 250 MG PO TABS: 250.0000 mg | ORAL_TABLET | Freq: Two times a day (BID) | ORAL | 0 refills | Status: DC

## 2020-02-22 MED ORDER — ALPRAZOLAM 0.5 MG PO TABS: 0.5000 mg | ORAL_TABLET | Freq: Every evening | ORAL | 1 refills | Status: DC | PRN

## 2020-02-22 MED ORDER — HYDROCODONE-HOMATROPINE 5-1.5 MG/5ML PO SYRP: 5.0000 mL | ORAL_SOLUTION | Freq: Four times a day (QID) | ORAL | 0 refills | Status: DC | PRN

## 2020-02-22 NOTE — Assessment & Plan Note (Signed)
Medrol pack Ceftin po

## 2020-02-22 NOTE — Assessment & Plan Note (Signed)
On B12 

## 2020-02-22 NOTE — Patient Instructions (Addendum)
For a mild COVID-19 case you can take zinc 50 mg a day for 1 week, vitamin C 1000 mg daily for 1 week, vitamin D2 50,000 units weekly for 2 months (unless you are taking vitamin D daily already), Quercetin 500 mg twice a day for 1 week (if you can get it quick enough).  Maintain good oral hydration and take Tylenol with high fever.    You can buy COVID-19 express tests for home use. They are inexpensive, roughly $15 for 2 tests.  There is a link below: [email or web address]-test-kit.[email or web address]  You can order 4 free COVID-19 home tests via [employer]   ------------------------------------------------------------------------   Cardiac CT calcium scoring test $150 Tel # is [phone]   Computed tomography, more commonly known as a CT or CAT scan, is a diagnostic medical imaging test. Like traditional x-rays, it produces multiple images or pictures of the inside of the body. The cross-sectional images generated during a CT scan can be reformatted in multiple planes. They can even generate three-dimensional images. These images can be viewed on a computer monitor, printed on film or by a 3D printer, or transferred to a CD or DVD. CT images of internal organs, bones, soft tissue and blood vessels provide greater detail than traditional x-rays, particularly of soft tissues and blood vessels. A cardiac CT scan for coronary calcium is a non-invasive way of obtaining information about the presence, location and extent of calcified plaque in the coronary arteries-the vessels that supply oxygen-containing blood to the heart muscle. Calcified plaque results when there is a build-up of fat and other substances under the inner layer of the artery. This material can calcify which signals the presence of atherosclerosis, a disease of the vessel wall, also called coronary artery disease (CAD). People with this disease have an increased risk for heart attacks. In addition, over time, progression of plaque build up (CAD)  can narrow the arteries or even close off blood flow to the heart. The result may be chest pain, sometimes called "angina," or a heart attack. Because calcium is a marker of CAD, the amount of calcium detected on a cardiac CT scan is a helpful prognostic tool. The findings on cardiac CT are expressed as a calcium score. Another name for this test is coronary artery calcium scoring.  What are some common uses of the procedure? The goal of cardiac CT scan for calcium scoring is to determine if CAD is present and to what extent, even if there are no symptoms. It is a screening study that may be recommended by a physician for patients with risk factors for CAD but no clinical symptoms. The major risk factors for CAD are: . high blood cholesterol levels  . family history of heart attacks  . diabetes  . high blood pressure  . cigarette smoking  . overweight or obese  . physical inactivity   A negative cardiac CT scan for calcium scoring shows no calcification within the coronary arteries. This suggests that CAD is absent or so minimal it cannot be seen by this technique. The chance of having a heart attack over the next two to five years is very low under these circumstances. A positive test means that CAD is present, regardless of whether or not the patient is experiencing any symptoms. The amount of calcification-expressed as the calcium score-may help to predict the likelihood of a myocardial infarction (heart attack) in the coming years and helps your medical doctor or cardiologist decide whether the patient may need to take preventive   medicine or undertake other measures such as diet and exercise to lower the risk for heart attack. The extent of CAD is graded according to your calcium score:  Calcium Score  Presence of CAD (coronary artery disease)  0 No evidence of CAD   1-10 Minimal evidence of CAD  11-100 Mild evidence of CAD  101-400 Moderate evidence of CAD  Over 400 Extensive evidence of  CAD    

## 2020-02-22 NOTE — Progress Notes (Signed)
Subjective:  Patient ID: Shannon [name], female    DOB: January 13, 1966  Age: 54 y.o. MRN: [id number]  CC: Annual Exam   HPI Shannon [name] presents for a well exam C/o URI - better   Outpatient Medications Prior to Visit  Medication Sig Dispense Refill   albuterol (PROAIR HFA) 108 (90 Base) MCG/ACT inhaler Inhale 2 puffs into the lungs every 4 (four) hours as needed for wheezing or shortness of breath. 1 Inhaler 5   ALPRAZolam (XANAX) 0.5 MG tablet TAKE 1 TABLET BY MOUTH AT BEDTIME AS NEEDED FOR SLEEP 30 tablet 1   azelastine (ASTELIN) 0.1 % nasal spray Place 1 spray into both nostrils daily. Use in each nostril as directed     Cholecalciferol (VITAMIN D3) 50 MCG (2000 UT) capsule Take 1 capsule (2,000 Units total) by mouth daily. 100 capsule 3   EPINEPHrine 0.3 mg/0.3 mL IJ SOAJ injection SMARTSIG:0.3 Milliliter(s) IM Once PRN     loratadine (CLARITIN) 10 MG tablet Take 1 tablet (10 mg total) by mouth daily. 90 tablet 3   montelukast (SINGULAIR) 10 MG tablet Take 1 tablet (10 mg total) by mouth daily. 90 tablet 3   doxycycline (VIBRA-TABS) 100 MG tablet Take 1 tablet (100 mg total) by mouth 2 (two) times daily. (Patient not taking: Reported on 02/22/2020) 20 tablet 0   HYDROcodone-homatropine (HYCODAN) 5-1.5 MG/5ML syrup Take 5 mLs by mouth every 6 (six) hours as needed for cough. (Patient not taking: Reported on 02/22/2020) 120 mL 0   No facility-administered medications prior to visit.    ROS: Review of Systems  Constitutional: Negative for activity change, appetite change, chills, fatigue and unexpected weight change.  HENT: Negative for congestion, mouth sores and sinus pressure.   Eyes: Negative for visual disturbance.  Respiratory: Positive for cough. Negative for chest tightness and wheezing.   Gastrointestinal: Negative for abdominal pain and nausea.  Genitourinary: Negative for difficulty urinating, frequency and vaginal pain.  Musculoskeletal: Negative for back pain  and gait problem.  Skin: Negative for pallor and rash.  Neurological: Negative for dizziness, tremors, weakness, numbness and headaches.  Psychiatric/Behavioral: Negative for confusion and sleep disturbance.    Objective:  BP 122/60 (BP Location: Left Arm)    Pulse 87    Temp 98.3 F (36.8 C) (Oral)    Ht 5\' 3"  (1.6 m)    Wt 166 lb (75.3 kg)    SpO2 97%    BMI 29.41 kg/m   BP Readings from Last 3 Encounters:  02/22/20 122/60  02/25/18 124/80  02/19/16 120/80    Wt Readings from Last 3 Encounters:  02/22/20 166 lb (75.3 kg)  02/25/18 172 lb (78 kg)  02/19/16 166 lb (75.3 kg)    Physical Exam Constitutional:      General: She is not in acute distress.    Appearance: She is well-developed. She is obese.  HENT:     Head: Normocephalic.     Right Ear: External ear normal.     Left Ear: External ear normal.     Nose: Nose normal.     Mouth/Throat:     Mouth: Oropharynx is clear and moist.  Eyes:     General:        Right eye: No discharge.        Left eye: No discharge.     Conjunctiva/sclera: Conjunctivae normal.     Pupils: Pupils are equal, round, and reactive to light.  Neck:     Thyroid: No thyromegaly.  Vascular: No JVD.     Trachea: No tracheal deviation.  Cardiovascular:     Rate and Rhythm: Normal rate and regular rhythm.     Heart sounds: Normal heart sounds.  Pulmonary:     Effort: No respiratory distress.     Breath sounds: No stridor. No wheezing.  Abdominal:     General: Bowel sounds are normal. There is no distension.     Palpations: Abdomen is soft. There is no mass.     Tenderness: There is no abdominal tenderness. There is no guarding or rebound.  Musculoskeletal:        General: No tenderness or edema.     Cervical back: Normal range of motion and neck supple.  Lymphadenopathy:     Cervical: No cervical adenopathy.  Skin:    Findings: No erythema or rash.  Neurological:     Mental Status: She is oriented to person, place, and time.      Cranial Nerves: No cranial nerve deficit.     Motor: No abnormal muscle tone.     Coordination: Coordination normal.     Deep Tendon Reflexes: Reflexes normal.  Psychiatric:        Mood and Affect: Mood and affect normal.        Behavior: Behavior normal.        Thought Content: Thought content normal.        Judgment: Judgment normal.      Lab Results  Component Value Date   WBC 7.0 02/22/2020   HGB 12.7 02/22/2020   HCT 36.3 02/22/2020   PLT 279.0 02/22/2020   GLUCOSE 95 02/22/2020   CHOL 227 (H) 02/22/2020   TRIG 103.0 02/22/2020   HDL 61.60 02/22/2020   LDLCALC 145 (H) 02/22/2020   ALT 14 02/22/2020   AST 12 02/22/2020   NA 139 02/22/2020   K 4.0 02/22/2020   CL 105 02/22/2020   CREATININE 0.62 02/22/2020   BUN 19 02/22/2020   CO2 26 02/22/2020   TSH 1.05 02/22/2020   INR 1.2 (H) 11/18/2012   HGBA1C 5.2 12/23/2012    DG Chest 2 View  Result Date: 02/09/2020 CLINICAL DATA:  Cough and shortness of breath for 3 weeks. EXAM: CHEST - 2 VIEW COMPARISON:  01/30/2016 FINDINGS: The heart size and mediastinal contours are within normal limits. Both lungs are clear. The visualized skeletal structures are unremarkable. IMPRESSION: No active cardiopulmonary disease. Electronically Signed   By: Marlaine Hind M.D.   On: 02/09/2020 16:48    Assessment & Plan:   Walker Kehr, MD

## 2020-02-22 NOTE — Assessment & Plan Note (Addendum)
Ceftin po 5 d Diflucan 150 mg

## 2020-03-07 DIAGNOSIS — J3089 Other allergic rhinitis: Secondary | ICD-10-CM | POA: Diagnosis not present

## 2020-03-07 DIAGNOSIS — J301 Allergic rhinitis due to pollen: Secondary | ICD-10-CM | POA: Diagnosis not present

## 2020-03-07 DIAGNOSIS — J3081 Allergic rhinitis due to animal (cat) (dog) hair and dander: Secondary | ICD-10-CM | POA: Diagnosis not present

## 2020-03-14 DIAGNOSIS — J3081 Allergic rhinitis due to animal (cat) (dog) hair and dander: Secondary | ICD-10-CM | POA: Diagnosis not present

## 2020-03-14 DIAGNOSIS — J3089 Other allergic rhinitis: Secondary | ICD-10-CM | POA: Diagnosis not present

## 2020-03-14 DIAGNOSIS — J301 Allergic rhinitis due to pollen: Secondary | ICD-10-CM | POA: Diagnosis not present

## 2020-03-18 DIAGNOSIS — J3089 Other allergic rhinitis: Secondary | ICD-10-CM | POA: Diagnosis not present

## 2020-03-18 DIAGNOSIS — J3081 Allergic rhinitis due to animal (cat) (dog) hair and dander: Secondary | ICD-10-CM | POA: Diagnosis not present

## 2020-03-24 DIAGNOSIS — J3081 Allergic rhinitis due to animal (cat) (dog) hair and dander: Secondary | ICD-10-CM | POA: Diagnosis not present

## 2020-03-24 DIAGNOSIS — J3089 Other allergic rhinitis: Secondary | ICD-10-CM | POA: Diagnosis not present

## 2020-03-24 DIAGNOSIS — J301 Allergic rhinitis due to pollen: Secondary | ICD-10-CM | POA: Diagnosis not present

## 2020-03-27 ENCOUNTER — Other Ambulatory Visit: Payer: Self-pay

## 2020-03-27 ENCOUNTER — Ambulatory Visit (INDEPENDENT_AMBULATORY_CARE_PROVIDER_SITE_OTHER)
Admission: RE | Admit: 2020-03-27 | Discharge: 2020-03-27 | Disposition: A | Discharge status: Home / Self Care | Payer: Self-pay | Source: Ambulatory Visit | Attending: Internal Medicine | Admitting: Internal Medicine

## 2020-03-27 DIAGNOSIS — E785 Hyperlipidemia, unspecified: Secondary | ICD-10-CM

## 2020-03-30 DIAGNOSIS — J301 Allergic rhinitis due to pollen: Secondary | ICD-10-CM | POA: Diagnosis not present

## 2020-03-30 DIAGNOSIS — J3089 Other allergic rhinitis: Secondary | ICD-10-CM | POA: Diagnosis not present

## 2020-03-30 DIAGNOSIS — J3081 Allergic rhinitis due to animal (cat) (dog) hair and dander: Secondary | ICD-10-CM | POA: Diagnosis not present

## 2020-04-07 DIAGNOSIS — J3089 Other allergic rhinitis: Secondary | ICD-10-CM | POA: Diagnosis not present

## 2020-04-07 DIAGNOSIS — J3081 Allergic rhinitis due to animal (cat) (dog) hair and dander: Secondary | ICD-10-CM | POA: Diagnosis not present

## 2020-04-07 DIAGNOSIS — J301 Allergic rhinitis due to pollen: Secondary | ICD-10-CM | POA: Diagnosis not present

## 2020-04-13 DIAGNOSIS — J3081 Allergic rhinitis due to animal (cat) (dog) hair and dander: Secondary | ICD-10-CM | POA: Diagnosis not present

## 2020-04-13 DIAGNOSIS — H1045 Other chronic allergic conjunctivitis: Secondary | ICD-10-CM | POA: Diagnosis not present

## 2020-04-13 DIAGNOSIS — J301 Allergic rhinitis due to pollen: Secondary | ICD-10-CM | POA: Diagnosis not present

## 2020-04-13 DIAGNOSIS — J4530 Mild persistent asthma, uncomplicated: Secondary | ICD-10-CM | POA: Diagnosis not present

## 2020-04-13 DIAGNOSIS — J3089 Other allergic rhinitis: Secondary | ICD-10-CM | POA: Diagnosis not present

## 2020-04-15 DIAGNOSIS — J301 Allergic rhinitis due to pollen: Secondary | ICD-10-CM | POA: Diagnosis not present

## 2020-04-20 DIAGNOSIS — Z20822 Contact with and (suspected) exposure to covid-19: Secondary | ICD-10-CM | POA: Diagnosis not present

## 2020-04-27 DIAGNOSIS — J3081 Allergic rhinitis due to animal (cat) (dog) hair and dander: Secondary | ICD-10-CM | POA: Diagnosis not present

## 2020-04-27 DIAGNOSIS — J3089 Other allergic rhinitis: Secondary | ICD-10-CM | POA: Diagnosis not present

## 2020-04-27 DIAGNOSIS — J301 Allergic rhinitis due to pollen: Secondary | ICD-10-CM | POA: Diagnosis not present

## 2020-05-05 DIAGNOSIS — J3081 Allergic rhinitis due to animal (cat) (dog) hair and dander: Secondary | ICD-10-CM | POA: Diagnosis not present

## 2020-05-05 DIAGNOSIS — J3089 Other allergic rhinitis: Secondary | ICD-10-CM | POA: Diagnosis not present

## 2020-05-05 DIAGNOSIS — J301 Allergic rhinitis due to pollen: Secondary | ICD-10-CM | POA: Diagnosis not present

## 2020-05-11 DIAGNOSIS — J3081 Allergic rhinitis due to animal (cat) (dog) hair and dander: Secondary | ICD-10-CM | POA: Diagnosis not present

## 2020-05-11 DIAGNOSIS — J3089 Other allergic rhinitis: Secondary | ICD-10-CM | POA: Diagnosis not present

## 2020-05-11 DIAGNOSIS — J301 Allergic rhinitis due to pollen: Secondary | ICD-10-CM | POA: Diagnosis not present

## 2020-05-18 DIAGNOSIS — J3081 Allergic rhinitis due to animal (cat) (dog) hair and dander: Secondary | ICD-10-CM | POA: Diagnosis not present

## 2020-05-18 DIAGNOSIS — J301 Allergic rhinitis due to pollen: Secondary | ICD-10-CM | POA: Diagnosis not present

## 2020-05-18 DIAGNOSIS — J3089 Other allergic rhinitis: Secondary | ICD-10-CM | POA: Diagnosis not present

## 2020-05-25 DIAGNOSIS — J3089 Other allergic rhinitis: Secondary | ICD-10-CM | POA: Diagnosis not present

## 2020-05-25 DIAGNOSIS — J3081 Allergic rhinitis due to animal (cat) (dog) hair and dander: Secondary | ICD-10-CM | POA: Diagnosis not present

## 2020-05-25 DIAGNOSIS — J301 Allergic rhinitis due to pollen: Secondary | ICD-10-CM | POA: Diagnosis not present

## 2020-06-06 DIAGNOSIS — J301 Allergic rhinitis due to pollen: Secondary | ICD-10-CM | POA: Diagnosis not present

## 2020-06-06 DIAGNOSIS — J3089 Other allergic rhinitis: Secondary | ICD-10-CM | POA: Diagnosis not present

## 2020-06-06 DIAGNOSIS — J3081 Allergic rhinitis due to animal (cat) (dog) hair and dander: Secondary | ICD-10-CM | POA: Diagnosis not present

## 2020-06-10 DIAGNOSIS — Z20822 Contact with and (suspected) exposure to covid-19: Secondary | ICD-10-CM | POA: Diagnosis not present

## 2020-06-13 DIAGNOSIS — J301 Allergic rhinitis due to pollen: Secondary | ICD-10-CM | POA: Diagnosis not present

## 2020-06-13 DIAGNOSIS — J3081 Allergic rhinitis due to animal (cat) (dog) hair and dander: Secondary | ICD-10-CM | POA: Diagnosis not present

## 2020-06-13 DIAGNOSIS — J3089 Other allergic rhinitis: Secondary | ICD-10-CM | POA: Diagnosis not present

## 2020-06-23 DIAGNOSIS — J301 Allergic rhinitis due to pollen: Secondary | ICD-10-CM | POA: Diagnosis not present

## 2020-06-23 DIAGNOSIS — J3081 Allergic rhinitis due to animal (cat) (dog) hair and dander: Secondary | ICD-10-CM | POA: Diagnosis not present

## 2020-06-23 DIAGNOSIS — J3089 Other allergic rhinitis: Secondary | ICD-10-CM | POA: Diagnosis not present

## 2020-06-29 DIAGNOSIS — J3081 Allergic rhinitis due to animal (cat) (dog) hair and dander: Secondary | ICD-10-CM | POA: Diagnosis not present

## 2020-06-29 DIAGNOSIS — J3089 Other allergic rhinitis: Secondary | ICD-10-CM | POA: Diagnosis not present

## 2020-06-29 DIAGNOSIS — J301 Allergic rhinitis due to pollen: Secondary | ICD-10-CM | POA: Diagnosis not present

## 2020-07-06 DIAGNOSIS — J3089 Other allergic rhinitis: Secondary | ICD-10-CM | POA: Diagnosis not present

## 2020-07-06 DIAGNOSIS — J3081 Allergic rhinitis due to animal (cat) (dog) hair and dander: Secondary | ICD-10-CM | POA: Diagnosis not present

## 2020-07-06 DIAGNOSIS — J301 Allergic rhinitis due to pollen: Secondary | ICD-10-CM | POA: Diagnosis not present

## 2020-08-10 DIAGNOSIS — L720 Epidermal cyst: Secondary | ICD-10-CM | POA: Diagnosis not present

## 2020-08-10 DIAGNOSIS — D2261 Melanocytic nevi of right upper limb, including shoulder: Secondary | ICD-10-CM | POA: Diagnosis not present

## 2020-08-11 DIAGNOSIS — J3089 Other allergic rhinitis: Secondary | ICD-10-CM | POA: Diagnosis not present

## 2020-08-11 DIAGNOSIS — J3081 Allergic rhinitis due to animal (cat) (dog) hair and dander: Secondary | ICD-10-CM | POA: Diagnosis not present

## 2020-08-11 DIAGNOSIS — J301 Allergic rhinitis due to pollen: Secondary | ICD-10-CM | POA: Diagnosis not present

## 2020-08-24 DIAGNOSIS — J3089 Other allergic rhinitis: Secondary | ICD-10-CM | POA: Diagnosis not present

## 2020-08-24 DIAGNOSIS — J3081 Allergic rhinitis due to animal (cat) (dog) hair and dander: Secondary | ICD-10-CM | POA: Diagnosis not present

## 2020-08-24 DIAGNOSIS — J301 Allergic rhinitis due to pollen: Secondary | ICD-10-CM | POA: Diagnosis not present

## 2020-08-29 DIAGNOSIS — J3089 Other allergic rhinitis: Secondary | ICD-10-CM | POA: Diagnosis not present

## 2020-08-29 DIAGNOSIS — J301 Allergic rhinitis due to pollen: Secondary | ICD-10-CM | POA: Diagnosis not present

## 2020-08-29 DIAGNOSIS — J3081 Allergic rhinitis due to animal (cat) (dog) hair and dander: Secondary | ICD-10-CM | POA: Diagnosis not present

## 2020-09-05 DIAGNOSIS — J3089 Other allergic rhinitis: Secondary | ICD-10-CM | POA: Diagnosis not present

## 2020-09-05 DIAGNOSIS — J301 Allergic rhinitis due to pollen: Secondary | ICD-10-CM | POA: Diagnosis not present

## 2020-09-05 DIAGNOSIS — J3081 Allergic rhinitis due to animal (cat) (dog) hair and dander: Secondary | ICD-10-CM | POA: Diagnosis not present

## 2020-09-15 DIAGNOSIS — J3081 Allergic rhinitis due to animal (cat) (dog) hair and dander: Secondary | ICD-10-CM | POA: Diagnosis not present

## 2020-09-15 DIAGNOSIS — J301 Allergic rhinitis due to pollen: Secondary | ICD-10-CM | POA: Diagnosis not present

## 2020-09-15 DIAGNOSIS — J3089 Other allergic rhinitis: Secondary | ICD-10-CM | POA: Diagnosis not present

## 2020-09-21 DIAGNOSIS — J301 Allergic rhinitis due to pollen: Secondary | ICD-10-CM | POA: Diagnosis not present

## 2020-09-21 DIAGNOSIS — J3081 Allergic rhinitis due to animal (cat) (dog) hair and dander: Secondary | ICD-10-CM | POA: Diagnosis not present

## 2020-09-21 DIAGNOSIS — J3089 Other allergic rhinitis: Secondary | ICD-10-CM | POA: Diagnosis not present

## 2020-09-28 DIAGNOSIS — J301 Allergic rhinitis due to pollen: Secondary | ICD-10-CM | POA: Diagnosis not present

## 2020-09-28 DIAGNOSIS — J3089 Other allergic rhinitis: Secondary | ICD-10-CM | POA: Diagnosis not present

## 2020-09-28 DIAGNOSIS — J3081 Allergic rhinitis due to animal (cat) (dog) hair and dander: Secondary | ICD-10-CM | POA: Diagnosis not present

## 2020-10-05 DIAGNOSIS — J3081 Allergic rhinitis due to animal (cat) (dog) hair and dander: Secondary | ICD-10-CM | POA: Diagnosis not present

## 2020-10-05 DIAGNOSIS — J301 Allergic rhinitis due to pollen: Secondary | ICD-10-CM | POA: Diagnosis not present

## 2020-10-05 DIAGNOSIS — J3089 Other allergic rhinitis: Secondary | ICD-10-CM | POA: Diagnosis not present

## 2020-10-12 DIAGNOSIS — J3089 Other allergic rhinitis: Secondary | ICD-10-CM | POA: Diagnosis not present

## 2020-10-12 DIAGNOSIS — J3081 Allergic rhinitis due to animal (cat) (dog) hair and dander: Secondary | ICD-10-CM | POA: Diagnosis not present

## 2020-10-12 DIAGNOSIS — J301 Allergic rhinitis due to pollen: Secondary | ICD-10-CM | POA: Diagnosis not present

## 2020-10-19 DIAGNOSIS — J3081 Allergic rhinitis due to animal (cat) (dog) hair and dander: Secondary | ICD-10-CM | POA: Diagnosis not present

## 2020-10-19 DIAGNOSIS — J3089 Other allergic rhinitis: Secondary | ICD-10-CM | POA: Diagnosis not present

## 2020-10-19 DIAGNOSIS — J301 Allergic rhinitis due to pollen: Secondary | ICD-10-CM | POA: Diagnosis not present

## 2020-10-26 DIAGNOSIS — J301 Allergic rhinitis due to pollen: Secondary | ICD-10-CM | POA: Diagnosis not present

## 2020-10-26 DIAGNOSIS — J3081 Allergic rhinitis due to animal (cat) (dog) hair and dander: Secondary | ICD-10-CM | POA: Diagnosis not present

## 2020-10-26 DIAGNOSIS — J3089 Other allergic rhinitis: Secondary | ICD-10-CM | POA: Diagnosis not present

## 2020-11-01 ENCOUNTER — Ambulatory Visit: Payer: Self-pay

## 2020-11-01 ENCOUNTER — Other Ambulatory Visit: Payer: Self-pay

## 2020-11-01 DIAGNOSIS — Z23 Encounter for immunization: Secondary | ICD-10-CM

## 2020-11-02 DIAGNOSIS — J3081 Allergic rhinitis due to animal (cat) (dog) hair and dander: Secondary | ICD-10-CM | POA: Diagnosis not present

## 2020-11-02 DIAGNOSIS — J301 Allergic rhinitis due to pollen: Secondary | ICD-10-CM | POA: Diagnosis not present

## 2020-11-02 DIAGNOSIS — J3089 Other allergic rhinitis: Secondary | ICD-10-CM | POA: Diagnosis not present

## 2020-11-09 DIAGNOSIS — Z1231 Encounter for screening mammogram for malignant neoplasm of breast: Secondary | ICD-10-CM | POA: Diagnosis not present

## 2020-11-09 DIAGNOSIS — Z01419 Encounter for gynecological examination (general) (routine) without abnormal findings: Secondary | ICD-10-CM | POA: Diagnosis not present

## 2020-11-09 DIAGNOSIS — Z6830 Body mass index (BMI) 30.0-30.9, adult: Secondary | ICD-10-CM | POA: Diagnosis not present

## 2020-11-16 DIAGNOSIS — J3081 Allergic rhinitis due to animal (cat) (dog) hair and dander: Secondary | ICD-10-CM | POA: Diagnosis not present

## 2020-11-16 DIAGNOSIS — J3089 Other allergic rhinitis: Secondary | ICD-10-CM | POA: Diagnosis not present

## 2020-11-16 DIAGNOSIS — J301 Allergic rhinitis due to pollen: Secondary | ICD-10-CM | POA: Diagnosis not present

## 2020-11-23 DIAGNOSIS — J301 Allergic rhinitis due to pollen: Secondary | ICD-10-CM | POA: Diagnosis not present

## 2020-11-23 DIAGNOSIS — J3089 Other allergic rhinitis: Secondary | ICD-10-CM | POA: Diagnosis not present

## 2020-11-23 DIAGNOSIS — J3081 Allergic rhinitis due to animal (cat) (dog) hair and dander: Secondary | ICD-10-CM | POA: Diagnosis not present

## 2020-11-30 DIAGNOSIS — J3081 Allergic rhinitis due to animal (cat) (dog) hair and dander: Secondary | ICD-10-CM | POA: Diagnosis not present

## 2020-11-30 DIAGNOSIS — J3089 Other allergic rhinitis: Secondary | ICD-10-CM | POA: Diagnosis not present

## 2020-11-30 DIAGNOSIS — J301 Allergic rhinitis due to pollen: Secondary | ICD-10-CM | POA: Diagnosis not present

## 2020-12-07 DIAGNOSIS — J301 Allergic rhinitis due to pollen: Secondary | ICD-10-CM | POA: Diagnosis not present

## 2020-12-07 DIAGNOSIS — J3081 Allergic rhinitis due to animal (cat) (dog) hair and dander: Secondary | ICD-10-CM | POA: Diagnosis not present

## 2020-12-07 DIAGNOSIS — J3089 Other allergic rhinitis: Secondary | ICD-10-CM | POA: Diagnosis not present

## 2020-12-19 DIAGNOSIS — J3089 Other allergic rhinitis: Secondary | ICD-10-CM | POA: Diagnosis not present

## 2020-12-19 DIAGNOSIS — J301 Allergic rhinitis due to pollen: Secondary | ICD-10-CM | POA: Diagnosis not present

## 2020-12-19 DIAGNOSIS — J3081 Allergic rhinitis due to animal (cat) (dog) hair and dander: Secondary | ICD-10-CM | POA: Diagnosis not present

## 2020-12-25 ENCOUNTER — Other Ambulatory Visit: Payer: Self-pay | Admitting: Internal Medicine

## 2020-12-26 DIAGNOSIS — Z23 Encounter for immunization: Secondary | ICD-10-CM | POA: Diagnosis not present

## 2021-01-09 DIAGNOSIS — J301 Allergic rhinitis due to pollen: Secondary | ICD-10-CM | POA: Diagnosis not present

## 2021-01-09 DIAGNOSIS — J3081 Allergic rhinitis due to animal (cat) (dog) hair and dander: Secondary | ICD-10-CM | POA: Diagnosis not present

## 2021-01-09 DIAGNOSIS — J3089 Other allergic rhinitis: Secondary | ICD-10-CM | POA: Diagnosis not present

## 2021-01-25 DIAGNOSIS — J3089 Other allergic rhinitis: Secondary | ICD-10-CM | POA: Diagnosis not present

## 2021-01-25 DIAGNOSIS — J301 Allergic rhinitis due to pollen: Secondary | ICD-10-CM | POA: Diagnosis not present

## 2021-01-25 DIAGNOSIS — J3081 Allergic rhinitis due to animal (cat) (dog) hair and dander: Secondary | ICD-10-CM | POA: Diagnosis not present

## 2021-01-26 DIAGNOSIS — J3089 Other allergic rhinitis: Secondary | ICD-10-CM | POA: Diagnosis not present

## 2021-01-26 DIAGNOSIS — J3081 Allergic rhinitis due to animal (cat) (dog) hair and dander: Secondary | ICD-10-CM | POA: Diagnosis not present

## 2021-02-01 DIAGNOSIS — J3089 Other allergic rhinitis: Secondary | ICD-10-CM | POA: Diagnosis not present

## 2021-02-01 DIAGNOSIS — J4530 Mild persistent asthma, uncomplicated: Secondary | ICD-10-CM | POA: Diagnosis not present

## 2021-02-01 DIAGNOSIS — H1045 Other chronic allergic conjunctivitis: Secondary | ICD-10-CM | POA: Diagnosis not present

## 2021-02-01 DIAGNOSIS — J301 Allergic rhinitis due to pollen: Secondary | ICD-10-CM | POA: Diagnosis not present

## 2021-02-06 DIAGNOSIS — J301 Allergic rhinitis due to pollen: Secondary | ICD-10-CM | POA: Diagnosis not present

## 2021-02-06 DIAGNOSIS — J3089 Other allergic rhinitis: Secondary | ICD-10-CM | POA: Diagnosis not present

## 2021-02-06 DIAGNOSIS — J3081 Allergic rhinitis due to animal (cat) (dog) hair and dander: Secondary | ICD-10-CM | POA: Diagnosis not present

## 2021-02-15 ENCOUNTER — Telehealth (INDEPENDENT_AMBULATORY_CARE_PROVIDER_SITE_OTHER): Payer: BC Managed Care – PPO | Admitting: Family Medicine

## 2021-02-15 DIAGNOSIS — R0981 Nasal congestion: Secondary | ICD-10-CM

## 2021-02-15 DIAGNOSIS — R519 Headache, unspecified: Secondary | ICD-10-CM

## 2021-02-15 MED ORDER — DOXYCYCLINE HYCLATE 100 MG PO TABS: 100.0000 mg | ORAL_TABLET | Freq: Two times a day (BID) | ORAL | 0 refills | Status: DC

## 2021-02-15 NOTE — Progress Notes (Signed)
Virtual Visit via Video Note  I connected with Shannon [name]  on 02/15/21 at 10:00 AM EST by a video enabled telemedicine application and verified that I am speaking with the correct person using two identifiers.  Location patient: [place] Location provider:work or home office Persons participating in the virtual visit: patient, provider  I discussed the limitations and requested verbal permission for telemedicine visit. The patient expressed understanding and agreed to proceed.   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 -new exposures - new cat in the house during this time, cat is going to be leaving at some point -she thinks she did have a cat allergy in the past, but her allergist told her she is not allergic to cats -Denies: fevers, CP, SOB, NV, severe HA -she has tried nasal saline and is on allergy regimen with her allergist -Pertinent past medical history: see below -Pertinent medication allergies: Allergies  Allergen Reactions   Augmentin [Amoxicillin-Pot Clavulanate] Diarrhea   Moxifloxacin     REACTION: palpitations  -COVID-19 vaccine status:  Immunization History  Administered Date(s) Administered   Influenza Whole 10/05/2010   Influenza, High Dose Seasonal PF 02/24/2018, 02/11/2019   Influenza,inj,Quad PF,6+ Mos 11/10/2012, 10/28/2018, 11/01/2020   Influenza-Unspecified 09/29/2015, 11/19/2016, 11/18/2017, 11/16/2019   Moderna SARS-COV2 Booster Vaccination 01/17/2020   Moderna Sars-Covid-2 Vaccination 03/29/2019, 04/26/2019   Pneumococcal Conjugate-13 02/25/2018   Td 07/29/2006   Tdap 02/19/2016     ROS: See pertinent  positives and negatives per HPI.  Past Medical History:  Diagnosis Date   GERD (gastroesophageal reflux disease)     Past Surgical History:  Procedure Laterality Date   None       Current Outpatient Medications:    doxycycline (VIBRA-TABS) 100 MG tablet, Take 1 tablet (100 mg total) by mouth 2 (two) times daily., Disp: 20 tablet, Rfl: 0   albuterol (PROAIR HFA) 108 (90 Base) MCG/ACT inhaler, Inhale 2 puffs into the lungs every 4 (four) hours as needed for wheezing or shortness of breath., Disp: 1 Inhaler, Rfl: 5   ALPRAZolam (XANAX) 0.5 MG tablet, TAKE 1 TABLET(0.5 MG) BY MOUTH AT BEDTIME AS NEEDED FOR SLEEP, Disp: 30 tablet, Rfl: 0   azelastine (ASTELIN) 0.1 % nasal spray, Place 1 spray into both nostrils daily. Use in each nostril as directed, Disp: , Rfl:    cefUROXime (CEFTIN) 250 MG tablet, Take 1 tablet (250 mg total) by mouth 2 (two) times daily with a meal., Disp: 10 tablet, Rfl: 0   Cholecalciferol (VITAMIN D3) 50 MCG (2000 UT) capsule, Take 1 capsule (2,000 Units total) by mouth daily., Disp: 100 capsule, Rfl: 3   EPINEPHrine 0.3 mg/0.3 mL IJ SOAJ injection, SMARTSIG:0.3 Milliliter(s) IM Once PRN, Disp: , Rfl:    HYDROcodone-homatropine (HYCODAN) 5-1.5 MG/5ML syrup, Take 5 mLs by mouth every 6 (six) hours as needed for cough., Disp: 120 mL, Rfl: 0   loratadine (CLARITIN) 10 MG tablet, Take 1 tablet (10 mg total) by mouth daily., Disp: 90 tablet, Rfl: 3   methylPREDNISolone (MEDROL DOSEPAK) 4 MG TBPK tablet, As directed, Disp: 21 tablet, Rfl: 0   montelukast (SINGULAIR) 10 MG tablet, Take  1 tablet (10 mg total) by mouth daily., Disp: 90 tablet, Rfl: 3  EXAM:  VITALS per patient if applicable:  GENERAL: alert, oriented, appears well and in no acute distress  HEENT: atraumatic, conjunttiva clear, no obvious abnormalities on inspection of external nose and ears  NECK: normal movements of the head and neck  LUNGS: on inspection no signs of respiratory distress, breathing rate  appears normal, no obvious gross SOB, gasping or wheezing  CV: no obvious cyanosis  MS: moves all visible extremities without noticeable abnormality  PSYCH/NEURO: pleasant and cooperative, no obvious depression or anxiety, speech and thought processing grossly intact  ASSESSMENT AND PLAN:  Discussed the following assessment and plan:  Nasal sinus congestion  Facial discomfort  -we discussed possible serious and likely etiologies, options for evaluation and workup, limitations of telemedicine visit vs in person visit, treatment, treatment risks and precautions. Pt is agreeable to treatment via telemedicine at this moment. Query Sinusitis, possibly secondary to allergic rhinitis. She wants to try a stronger abx and also plans to see her ENT specialist at [facility]. Opted to try doxy 100mg  bid x 10 days. Advised nasal saline twice daily follow up with ENT. Also discussed cat as potential trigger.    I discussed the assessment and treatment plan with the patient. The patient was provided an opportunity to ask questions and all were answered. The patient agreed with the plan and demonstrated an understanding of the instructions.     Lucretia Kern, DO

## 2021-02-15 NOTE — Patient Instructions (Signed)
-  I sent the medication(s) we discussed to your pharmacy: Meds ordered this encounter  Medications   doxycycline (VIBRA-TABS) 100 MG tablet    Sig: Take 1 tablet (100 mg total) by mouth 2 (two) times daily.    Dispense:  20 tablet    Refill:  0   Please follow up with your Ear, Nose and Throat specialist in the next few weeks.    I hope you are feeling better soon!  Seek in person care promptly if your symptoms worsen, new concerns arise or you are not improving with treatment.  It was nice to meet you today. I help [facility] out with telemedicine visits on Tuesdays and Thursdays and am happy to help if you need a virtual follow up visit on those days. Otherwise, if you have any concerns or questions following this visit please schedule a follow up visit with your Primary Care office or seek care at a local urgent care clinic to avoid delays in care

## 2021-02-19 DIAGNOSIS — D2261 Melanocytic nevi of right upper limb, including shoulder: Secondary | ICD-10-CM | POA: Diagnosis not present

## 2021-02-19 DIAGNOSIS — L821 Other seborrheic keratosis: Secondary | ICD-10-CM | POA: Diagnosis not present

## 2021-02-19 DIAGNOSIS — D2271 Melanocytic nevi of right lower limb, including hip: Secondary | ICD-10-CM | POA: Diagnosis not present

## 2021-02-19 DIAGNOSIS — D2272 Melanocytic nevi of left lower limb, including hip: Secondary | ICD-10-CM | POA: Diagnosis not present

## 2021-02-20 DIAGNOSIS — J3081 Allergic rhinitis due to animal (cat) (dog) hair and dander: Secondary | ICD-10-CM | POA: Diagnosis not present

## 2021-02-20 DIAGNOSIS — J301 Allergic rhinitis due to pollen: Secondary | ICD-10-CM | POA: Diagnosis not present

## 2021-02-20 DIAGNOSIS — J3089 Other allergic rhinitis: Secondary | ICD-10-CM | POA: Diagnosis not present

## 2021-03-01 DIAGNOSIS — J301 Allergic rhinitis due to pollen: Secondary | ICD-10-CM | POA: Diagnosis not present

## 2021-03-01 DIAGNOSIS — J3081 Allergic rhinitis due to animal (cat) (dog) hair and dander: Secondary | ICD-10-CM | POA: Diagnosis not present

## 2021-03-01 DIAGNOSIS — J3089 Other allergic rhinitis: Secondary | ICD-10-CM | POA: Diagnosis not present

## 2021-03-05 DIAGNOSIS — J309 Allergic rhinitis, unspecified: Secondary | ICD-10-CM | POA: Diagnosis not present

## 2021-03-05 DIAGNOSIS — R0981 Nasal congestion: Secondary | ICD-10-CM | POA: Diagnosis not present

## 2021-03-05 DIAGNOSIS — R490 Dysphonia: Secondary | ICD-10-CM | POA: Diagnosis not present

## 2021-03-08 DIAGNOSIS — J301 Allergic rhinitis due to pollen: Secondary | ICD-10-CM | POA: Diagnosis not present

## 2021-03-08 DIAGNOSIS — J3081 Allergic rhinitis due to animal (cat) (dog) hair and dander: Secondary | ICD-10-CM | POA: Diagnosis not present

## 2021-03-08 DIAGNOSIS — J3089 Other allergic rhinitis: Secondary | ICD-10-CM | POA: Diagnosis not present

## 2021-03-15 DIAGNOSIS — J301 Allergic rhinitis due to pollen: Secondary | ICD-10-CM | POA: Diagnosis not present

## 2021-03-15 DIAGNOSIS — J3089 Other allergic rhinitis: Secondary | ICD-10-CM | POA: Diagnosis not present

## 2021-03-15 DIAGNOSIS — J3081 Allergic rhinitis due to animal (cat) (dog) hair and dander: Secondary | ICD-10-CM | POA: Diagnosis not present

## 2021-03-22 DIAGNOSIS — J3089 Other allergic rhinitis: Secondary | ICD-10-CM | POA: Diagnosis not present

## 2021-03-22 DIAGNOSIS — J3081 Allergic rhinitis due to animal (cat) (dog) hair and dander: Secondary | ICD-10-CM | POA: Diagnosis not present

## 2021-03-22 DIAGNOSIS — J301 Allergic rhinitis due to pollen: Secondary | ICD-10-CM | POA: Diagnosis not present

## 2021-03-29 DIAGNOSIS — J301 Allergic rhinitis due to pollen: Secondary | ICD-10-CM | POA: Diagnosis not present

## 2021-03-29 DIAGNOSIS — J3089 Other allergic rhinitis: Secondary | ICD-10-CM | POA: Diagnosis not present

## 2021-03-29 DIAGNOSIS — J3081 Allergic rhinitis due to animal (cat) (dog) hair and dander: Secondary | ICD-10-CM | POA: Diagnosis not present

## 2021-04-05 DIAGNOSIS — J301 Allergic rhinitis due to pollen: Secondary | ICD-10-CM | POA: Diagnosis not present

## 2021-04-05 DIAGNOSIS — J3081 Allergic rhinitis due to animal (cat) (dog) hair and dander: Secondary | ICD-10-CM | POA: Diagnosis not present

## 2021-04-05 DIAGNOSIS — J3089 Other allergic rhinitis: Secondary | ICD-10-CM | POA: Diagnosis not present

## 2021-04-17 DIAGNOSIS — J301 Allergic rhinitis due to pollen: Secondary | ICD-10-CM | POA: Diagnosis not present

## 2021-04-17 DIAGNOSIS — J3081 Allergic rhinitis due to animal (cat) (dog) hair and dander: Secondary | ICD-10-CM | POA: Diagnosis not present

## 2021-04-17 DIAGNOSIS — J3089 Other allergic rhinitis: Secondary | ICD-10-CM | POA: Diagnosis not present

## 2021-05-01 DIAGNOSIS — J3089 Other allergic rhinitis: Secondary | ICD-10-CM | POA: Diagnosis not present

## 2021-05-01 DIAGNOSIS — J3081 Allergic rhinitis due to animal (cat) (dog) hair and dander: Secondary | ICD-10-CM | POA: Diagnosis not present

## 2021-05-01 DIAGNOSIS — J301 Allergic rhinitis due to pollen: Secondary | ICD-10-CM | POA: Diagnosis not present

## 2021-05-08 DIAGNOSIS — J301 Allergic rhinitis due to pollen: Secondary | ICD-10-CM | POA: Diagnosis not present

## 2021-05-08 DIAGNOSIS — J3089 Other allergic rhinitis: Secondary | ICD-10-CM | POA: Diagnosis not present

## 2021-05-08 DIAGNOSIS — J3081 Allergic rhinitis due to animal (cat) (dog) hair and dander: Secondary | ICD-10-CM | POA: Diagnosis not present

## 2021-05-17 DIAGNOSIS — J3081 Allergic rhinitis due to animal (cat) (dog) hair and dander: Secondary | ICD-10-CM | POA: Diagnosis not present

## 2021-05-17 DIAGNOSIS — J301 Allergic rhinitis due to pollen: Secondary | ICD-10-CM | POA: Diagnosis not present

## 2021-05-17 DIAGNOSIS — J3089 Other allergic rhinitis: Secondary | ICD-10-CM | POA: Diagnosis not present

## 2021-05-20 ENCOUNTER — Encounter: Payer: Self-pay | Admitting: Internal Medicine

## 2021-05-22 ENCOUNTER — Other Ambulatory Visit: Payer: Self-pay | Admitting: Internal Medicine

## 2021-05-23 ENCOUNTER — Encounter: Payer: Self-pay | Admitting: Internal Medicine

## 2021-05-24 DIAGNOSIS — J3081 Allergic rhinitis due to animal (cat) (dog) hair and dander: Secondary | ICD-10-CM | POA: Diagnosis not present

## 2021-05-24 DIAGNOSIS — J301 Allergic rhinitis due to pollen: Secondary | ICD-10-CM | POA: Diagnosis not present

## 2021-05-24 DIAGNOSIS — J3089 Other allergic rhinitis: Secondary | ICD-10-CM | POA: Diagnosis not present

## 2021-06-01 DIAGNOSIS — J3081 Allergic rhinitis due to animal (cat) (dog) hair and dander: Secondary | ICD-10-CM | POA: Diagnosis not present

## 2021-06-01 DIAGNOSIS — J301 Allergic rhinitis due to pollen: Secondary | ICD-10-CM | POA: Diagnosis not present

## 2021-06-01 DIAGNOSIS — J3089 Other allergic rhinitis: Secondary | ICD-10-CM | POA: Diagnosis not present

## 2021-06-04 ENCOUNTER — Telehealth: Payer: Self-pay | Admitting: Internal Medicine

## 2021-06-04 NOTE — Telephone Encounter (Signed)
error 

## 2021-06-04 NOTE — Telephone Encounter (Signed)
Good morning Dr. Lorenso Courier, ? ?This patient is requesting a transfer of care to you.  Her reasons for the transfer are: ? ?Her previous gastroenterologist, Dr. Earlean Shawl, is retiring. ?She prefers a female doctor. ?All of her other physicians are with [facility]. ? ?Her records are being sent to you for your review. ? ?Please let me know if you approve the transfer.   ? ?Thank you. ?

## 2021-06-07 DIAGNOSIS — J3089 Other allergic rhinitis: Secondary | ICD-10-CM | POA: Diagnosis not present

## 2021-06-07 DIAGNOSIS — J3081 Allergic rhinitis due to animal (cat) (dog) hair and dander: Secondary | ICD-10-CM | POA: Diagnosis not present

## 2021-06-07 DIAGNOSIS — J301 Allergic rhinitis due to pollen: Secondary | ICD-10-CM | POA: Diagnosis not present

## 2021-06-07 NOTE — Telephone Encounter (Signed)
Okay to schedule OV to establish care. ? ?Reviewed patient's records. ? ?EGD/Colonoscopy 06/22/04: 4 polyps (two diminutive, two 1 cm polyps) removed from the left colon. Normal upper endoscopy, distal esophageal biopsies taken.  ?Path: GE junction biopsies showed reflux. Hyperplastic polyps in the left colon. ? ?EGD/colonoscopy 02/27/09: 5 mm rectal polyp. Gastric polyps ?Path: Hyperplastic rectal polyp ? ?EGD/Colonoscopy 10/04/14: Small hiatal hernia, gastric polyps that were resected with hot snare and recovered with Jabier Mutton net. Normal esophagus. Normal duodenum. Normal colon with random biopsies taken. Recommended follow up colon in 10 years. ?Path: ?Normal colonic mucosa. Fundic gland polyp. Negative for H pylori. ? ? ?

## 2021-06-12 DIAGNOSIS — J3089 Other allergic rhinitis: Secondary | ICD-10-CM | POA: Diagnosis not present

## 2021-06-12 DIAGNOSIS — J3081 Allergic rhinitis due to animal (cat) (dog) hair and dander: Secondary | ICD-10-CM | POA: Diagnosis not present

## 2021-06-12 DIAGNOSIS — J301 Allergic rhinitis due to pollen: Secondary | ICD-10-CM | POA: Diagnosis not present

## 2021-06-28 DIAGNOSIS — J301 Allergic rhinitis due to pollen: Secondary | ICD-10-CM | POA: Diagnosis not present

## 2021-06-28 DIAGNOSIS — J3089 Other allergic rhinitis: Secondary | ICD-10-CM | POA: Diagnosis not present

## 2021-06-28 DIAGNOSIS — J3081 Allergic rhinitis due to animal (cat) (dog) hair and dander: Secondary | ICD-10-CM | POA: Diagnosis not present

## 2021-07-05 ENCOUNTER — Ambulatory Visit (INDEPENDENT_AMBULATORY_CARE_PROVIDER_SITE_OTHER): Payer: BC Managed Care – PPO | Admitting: Internal Medicine

## 2021-07-05 ENCOUNTER — Encounter: Payer: Self-pay | Admitting: Internal Medicine

## 2021-07-05 VITALS — BP 128/80 | HR 76 | Ht 62.25 in | Wt 169.5 lb

## 2021-07-05 DIAGNOSIS — R109 Unspecified abdominal pain: Secondary | ICD-10-CM

## 2021-07-05 DIAGNOSIS — R197 Diarrhea, unspecified: Secondary | ICD-10-CM | POA: Diagnosis not present

## 2021-07-05 DIAGNOSIS — K219 Gastro-esophageal reflux disease without esophagitis: Secondary | ICD-10-CM

## 2021-07-05 NOTE — Progress Notes (Signed)
Chief Complaint: Diarrhea  HPI : 56 year old female with history of asthma and GERD presents with diarrhea.  She has having diarrhea for years. The diarrhea is worse than it was in the past. She is having 2-3 BMs per day on average. Denies nocturnal stools. Denies following any particular diets. She does have some gas-pains that occur in the lower abdomen. This abdominal pain gets better after having a BM. She has seen scant amounts of bright red blood when she wipes. Her last colonoscopy was in 2016. Denies N&V, dysphagia, chest burning or regurgitation. Only has GERD when she eats really spicy foods.  Denies weight changes. Grandfather had colon cancer when he was in his 67s. She drinks about 2-5 alcoholic beverages per week.  She does feel like she has been dealing with more stress recently.  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  Past Medical History:  Diagnosis Date   Asthma    GERD (gastroesophageal reflux disease)     Past Surgical History:  Procedure Laterality Date   None     Family History  Problem Relation Age of Onset   Hyperlipidemia Mother    Healthy Father    Colon cancer Maternal Grandfather 82   Cancer - Lung Paternal Grandmother    Depression Daughter    Social History   Tobacco Use   Smoking status: Never   Smokeless tobacco: Never  Vaping Use   Vaping Use: Never used  Substance Use Topics   Alcohol use: Yes    Comment: Occasionally drink wine (2x per week)   Drug use: No   Current Outpatient Medications  Medication Sig Dispense Refill   albuterol (PROAIR HFA) 108 (90 Base) MCG/ACT inhaler Inhale 2 puffs into the lungs every 4 (four) hours as needed for wheezing or shortness of breath. 1 Inhaler 5   ALPRAZolam (XANAX) 0.5 MG tablet TAKE 1 TABLET(0.5 MG) BY MOUTH AT BEDTIME AS NEEDED FOR SLEEP 30 tablet 1   azelastine (ASTELIN) 0.1 % nasal spray Place 1 spray into both nostrils daily. Use in each nostril as directed     azelastine  (OPTIVAR) 0.05 % ophthalmic solution Place 1 drop into both eyes as needed.     Azelastine HCl 137 MCG/SPRAY SOLN Place 1 spray into the nose at bedtime.     Cyanocobalamin (VITAMIN B-12 PO) Take 1 tablet by mouth daily.     fluticasone (FLONASE) 50 MCG/ACT nasal spray Place 1 spray into both nostrils as needed.     levocetirizine (XYZAL) 5 MG tablet Take 1 tablet by mouth daily.     montelukast (SINGULAIR) 10 MG tablet Take 1 tablet (10 mg total) by mouth daily. 90 tablet 3   D 1000 25 MCG (1000 UT) capsule Take 1 capsule by mouth daily. (Patient not taking: Reported on 07/05/2021)     EPINEPHrine 0.3 mg/0.3 mL IJ SOAJ injection SMARTSIG:0.3 Milliliter(s) IM Once PRN (Patient not taking: Reported on 07/05/2021)     omeprazole (PRILOSEC) 40 MG capsule Take 1 capsule by mouth as needed. (Patient not taking: Reported on 07/05/2021)     No current facility-administered medications for this visit.   Allergies  Allergen Reactions   Augmentin [Amoxicillin-Pot Clavulanate] Diarrhea   Moxifloxacin     REACTION: palpitations   Review of Systems: All systems reviewed and negative except where noted in HPI.   Physical Exam: BP 128/80 (BP Location: Left Arm, Patient Position: Sitting, Cuff Size: Normal)   Pulse 76   Ht  5' 2.25" (1.581 m) Comment: height measured without shoes  Wt 169 lb 8 oz (76.9 kg)   BMI 30.75 kg/m  Constitutional: Pleasant,well-developed, female in no acute distress. HEENT: Normocephalic and atraumatic. Conjunctivae are normal. No scleral icterus. Cardiovascular: Normal rate, regular rhythm.  Pulmonary/chest: Effort normal and breath sounds normal. No wheezing, rales or rhonchi. Abdominal: Soft, nondistended, nontender. Bowel sounds active throughout. There are no masses palpable. No hepatomegaly. Extremities: No edema Neurological: Alert and oriented to person place and time. Skin: Skin is warm and dry. No rashes noted. Psychiatric: Normal mood and affect. Behavior is  normal.  Labs 01/2020: CBC nml. CMP nml. TSH nml.  EGD 02/27/09: Gastric fundic gland polyps  Colonoscopy 02/27/09: Rectal polyp (HP). Hemorrhoids.  EGD 10/04/14: Small hiatal hernia. No evidence of GERD. Fundic gland polyps, biopsy confirmed.  Colonoscopy 10/04/14: Normal colonoscopy  ASSESSMENT AND PLAN:  Diarrhea Abdominal discomfort GERD Patient presents with longstanding diarrhea that has worsened recently. This diarrhea may be consistent with IBS.  However will go ahead and plan for colonoscopy since she has had a recent worsening of her symptoms.  I encouraged her to try and find dietary triggers for her diarrhea and will give her information on low FODMAP diet - Low FODMAP diet - Try to avoid dairy - Colonoscopy [facility]  Christia Reading, MD

## 2021-07-05 NOTE — Patient Instructions (Signed)
You have been scheduled for a colonoscopy. Please follow written instructions given to you at your visit today.  Please pick up your prep supplies at the pharmacy within the next 1-3 days. If you use inhalers (even only as needed), please bring them with you on the day of your procedure.   LOW-FOD Map diet handout given  Due to recent changes in healthcare laws, you may see the results of your imaging and laboratory studies on MyChart before your provider has had a chance to review them.  We understand that in some cases there may be results that are confusing or concerning to you. Not all laboratory results come back in the same time frame and the provider may be waiting for multiple results in order to interpret others.  Please give [place] 48 hours in order for your provider to thoroughly review all the results before contacting the office for clarification of your results.    If you are age 28 or older, your body mass index should be between 23-30. Your Body mass index is 30.75 kg/m. If this is out of the aforementioned range listed, please consider follow up with your Primary Care Provider.  If you are age 64 or younger, your body mass index should be between 19-25. Your Body mass index is 30.75 kg/m. If this is out of the aformentioned range listed, please consider follow up with your Primary Care Provider.   ________________________________________________________  The [facility] providers would like to encourage you to use [facility] to communicate with providers for non-urgent requests or questions.  Due to long hold times on the telephone, sending your provider a message by [facility] may be a faster and more efficient way to get a response.  Please allow 48 business hours for a response.  Please remember that this is for non-urgent requests.  _______________________________________________________   Thank you for entrusting me with your care and for choosing [facility], Dr. Christia Reading

## 2021-07-06 DIAGNOSIS — J3081 Allergic rhinitis due to animal (cat) (dog) hair and dander: Secondary | ICD-10-CM | POA: Diagnosis not present

## 2021-07-06 DIAGNOSIS — J3089 Other allergic rhinitis: Secondary | ICD-10-CM | POA: Diagnosis not present

## 2021-07-06 DIAGNOSIS — J301 Allergic rhinitis due to pollen: Secondary | ICD-10-CM | POA: Diagnosis not present

## 2021-07-12 DIAGNOSIS — J3081 Allergic rhinitis due to animal (cat) (dog) hair and dander: Secondary | ICD-10-CM | POA: Diagnosis not present

## 2021-07-12 DIAGNOSIS — J3089 Other allergic rhinitis: Secondary | ICD-10-CM | POA: Diagnosis not present

## 2021-07-12 DIAGNOSIS — J301 Allergic rhinitis due to pollen: Secondary | ICD-10-CM | POA: Diagnosis not present

## 2021-07-19 DIAGNOSIS — J301 Allergic rhinitis due to pollen: Secondary | ICD-10-CM | POA: Diagnosis not present

## 2021-07-19 DIAGNOSIS — J3081 Allergic rhinitis due to animal (cat) (dog) hair and dander: Secondary | ICD-10-CM | POA: Diagnosis not present

## 2021-07-19 DIAGNOSIS — J3089 Other allergic rhinitis: Secondary | ICD-10-CM | POA: Diagnosis not present

## 2021-07-27 ENCOUNTER — Encounter: Payer: Self-pay | Admitting: Internal Medicine

## 2021-08-02 DIAGNOSIS — J301 Allergic rhinitis due to pollen: Secondary | ICD-10-CM | POA: Diagnosis not present

## 2021-08-02 DIAGNOSIS — J3081 Allergic rhinitis due to animal (cat) (dog) hair and dander: Secondary | ICD-10-CM | POA: Diagnosis not present

## 2021-08-02 DIAGNOSIS — J3089 Other allergic rhinitis: Secondary | ICD-10-CM | POA: Diagnosis not present

## 2021-08-03 ENCOUNTER — Encounter: Payer: Self-pay | Admitting: Internal Medicine

## 2021-08-03 ENCOUNTER — Ambulatory Visit (AMBULATORY_SURGERY_CENTER): Payer: BC Managed Care – PPO | Admitting: Internal Medicine

## 2021-08-03 VITALS — BP 130/72 | HR 71 | Temp 97.8°F | Resp 18 | Ht 62.0 in | Wt 169.0 lb

## 2021-08-03 DIAGNOSIS — K5289 Other specified noninfective gastroenteritis and colitis: Secondary | ICD-10-CM

## 2021-08-03 DIAGNOSIS — R197 Diarrhea, unspecified: Secondary | ICD-10-CM

## 2021-08-03 DIAGNOSIS — K635 Polyp of colon: Secondary | ICD-10-CM

## 2021-08-03 DIAGNOSIS — K529 Noninfective gastroenteritis and colitis, unspecified: Secondary | ICD-10-CM | POA: Diagnosis not present

## 2021-08-03 DIAGNOSIS — D122 Benign neoplasm of ascending colon: Secondary | ICD-10-CM

## 2021-08-03 DIAGNOSIS — D123 Benign neoplasm of transverse colon: Secondary | ICD-10-CM

## 2021-08-03 MED ORDER — SODIUM CHLORIDE 0.9 % IV SOLN: 500.0000 mL | Freq: Once | INTRAVENOUS | Status: DC

## 2021-08-03 MED ORDER — HYDROCORTISONE (PERIANAL) 2.5 % EX CREA: 1.0000 | TOPICAL_CREAM | Freq: Two times a day (BID) | CUTANEOUS | 0 refills | Status: DC

## 2021-08-03 NOTE — Progress Notes (Signed)
GASTROENTEROLOGY PROCEDURE H&P NOTE   Primary Care Physician: Cassandria Anger, MD    Reason for Procedure:   Diarrhea  Plan:    Colonoscopy  Patient is appropriate for endoscopic procedure(s) in the ambulatory ([facility]) setting.  The nature of the procedure, as well as the risks, benefits, and alternatives were carefully and thoroughly reviewed with the patient. Ample time for discussion and questions allowed. The patient understood, was satisfied, and agreed to proceed.     HPI: Shannon [name] is a 56 y.o. female who presents for colonoscopy for evaluation of diarrhea .  Patient was most recently seen in the Gastroenterology Clinic on 07/05/21.  No interval change in medical history since that appointment. Please refer to that note for full details regarding GI history and clinical presentation.   Past Medical History:  Diagnosis Date   Allergy    Arthritis    Asthma    Depression    GERD (gastroesophageal reflux disease)     Past Surgical History:  Procedure Laterality Date   extraction of wisdom teeth     per pt removed x2 - only had 2 per pt.   None      Prior to Admission medications   Medication Sig Start Date End Date Taking? Authorizing Provider  azelastine (OPTIVAR) 0.05 % ophthalmic solution Place 1 drop into both eyes as needed.   Yes [provider]  Cyanocobalamin (VITAMIN B-12 PO) Take 1 tablet by mouth daily.   Yes [provider]  albuterol (PROAIR HFA) 108 (90 Base) MCG/ACT inhaler Inhale 2 puffs into the lungs every 4 (four) hours as needed for wheezing or shortness of breath. 02/19/16   Plotnikov, Evie Lacks, MD  ALPRAZolam (XANAX) 0.5 MG tablet TAKE 1 TABLET(0.5 MG) BY MOUTH AT BEDTIME AS NEEDED FOR SLEEP 05/23/21   Plotnikov, Evie Lacks, MD  Azelastine HCl 137 MCG/SPRAY SOLN Place 1 spray into the nose at bedtime. 03/05/21   [provider]  EPINEPHrine 0.3 mg/0.3 mL IJ SOAJ injection SMARTSIG:0.3 Milliliter(s) IM Once  PRN Patient not taking: Reported on 07/05/2021 01/14/20   [provider]  fluticasone (FLONASE) 50 MCG/ACT nasal spray Place 1 spray into both nostrils as needed.    [provider]  levocetirizine (XYZAL) 5 MG tablet Take 1 tablet by mouth daily.    [provider]  montelukast (SINGULAIR) 10 MG tablet TAKE 1 TABLET BY MOUTH EVERY DAY IN THE EVENING FOR 7 DAYS    [provider]  omeprazole (PRILOSEC) 40 MG capsule Take 1 capsule by mouth as needed. Patient not taking: Reported on 07/05/2021 02/19/21   [provider]    Current Outpatient Medications  Medication Sig Dispense Refill   azelastine (OPTIVAR) 0.05 % ophthalmic solution Place 1 drop into both eyes as needed.     Cyanocobalamin (VITAMIN B-12 PO) Take 1 tablet by mouth daily.     albuterol (PROAIR HFA) 108 (90 Base) MCG/ACT inhaler Inhale 2 puffs into the lungs every 4 (four) hours as needed for wheezing or shortness of breath. 1 Inhaler 5   ALPRAZolam (XANAX) 0.5 MG tablet TAKE 1 TABLET(0.5 MG) BY MOUTH AT BEDTIME AS NEEDED FOR SLEEP 30 tablet 1   Azelastine HCl 137 MCG/SPRAY SOLN Place 1 spray into the nose at bedtime.     EPINEPHrine 0.3 mg/0.3 mL IJ SOAJ injection SMARTSIG:0.3 Milliliter(s) IM Once PRN (Patient not taking: Reported on 07/05/2021)     fluticasone (FLONASE) 50 MCG/ACT nasal spray Place 1 spray into  both nostrils as needed.     levocetirizine (XYZAL) 5 MG tablet Take 1 tablet by mouth daily.     montelukast (SINGULAIR) 10 MG tablet TAKE 1 TABLET BY MOUTH EVERY DAY IN THE EVENING FOR 7 DAYS     omeprazole (PRILOSEC) 40 MG capsule Take 1 capsule by mouth as needed. (Patient not taking: Reported on 07/05/2021)     Current Facility-Administered Medications  Medication Dose Route Frequency Provider Last Rate Last Admin   0.9 %  sodium chloride infusion  500 mL Intravenous Once Sharyn Creamer, MD        Allergies as of 08/03/2021 - Review Complete 08/03/2021  Allergen Reaction  Noted   Augmentin [amoxicillin-pot clavulanate] Diarrhea 02/09/2020   Moxifloxacin  01/12/2008    Family History  Problem Relation Age of Onset   Hyperlipidemia Mother    Healthy Father    Colon cancer Maternal Grandfather 31   Cancer - Lung Paternal Grandmother    Depression Daughter    Esophageal cancer Neg Hx    Rectal cancer Neg Hx    Stomach cancer Neg Hx     Social History   Socioeconomic History   Marital status: Married    Spouse name: Not on file   Number of children: 2   Years of education: Not on file   Highest education level: Not on file  Occupational History   Occupation: Music professor    Employer: [employer]  Tobacco Use   Smoking status: Never   Smokeless tobacco: Never  Vaping Use   Vaping Use: Never used  Substance and Sexual Activity   Alcohol use: Yes    Comment: Occasionally drink wine (2x per week)   Drug use: No   Sexual activity: Yes    Birth control/protection: Surgical    Comment: ablation  Other Topics Concern   Not on file  Social History Narrative   Regular Exercise- yes      She works as a Chief Operating Officer at [employer]   She lives with husband and two daughters.   Social Determinants of Health   Financial Resource Strain: Not on file  Food Insecurity: Not on file  Transportation Needs: Not on file  Physical Activity: Not on file  Stress: Not on file  Social Connections: Not on file  Intimate Partner Violence: Not on file    Physical Exam: Vital signs in last 24 hours: BP (!) 143/91   Pulse 84   Temp 97.8 F (36.6 C)   Ht '5\' 2"'$  (1.575 m)   Wt 169 lb (76.7 kg)   SpO2 96%   BMI 30.91 kg/m  GEN: NAD EYE: Sclerae anicteric ENT: MMM CV: Non-tachycardic Pulm: No increased WOB GI: Soft NEURO:  Alert & Oriented   Christia Reading, MD [facility]   08/03/2021 1:28 PM

## 2021-08-03 NOTE — Progress Notes (Signed)
Called to room to assist during endoscopic procedure.  Patient ID and intended procedure confirmed with present staff. Received instructions for my participation in the procedure from the performing physician.  

## 2021-08-03 NOTE — Progress Notes (Signed)
Report to PACU, RN, vss, BBS= Clear.  

## 2021-08-03 NOTE — Op Note (Addendum)
[facility] Patient Name: Shannon [name] Procedure Date: 08/03/2021 1:33 PM MRN: [id number] Endoscopist: Sonny Masters "Shannon [name] ,  Age: 56 Referring MD:  Date of Birth: Jul 14, 1965 Gender: Female Account #: [id number] Procedure:                Colonoscopy Indications:              Diarrhea Medicines:                Monitored Anesthesia Care Procedure:                Pre-Anesthesia Assessment:                           - Prior to the procedure, a History and Physical                            was performed, and patient medications and                            allergies were reviewed. The patient's tolerance of                            previous anesthesia was also reviewed. The risks                            and benefits of the procedure and the sedation                            options and risks were discussed with the patient.                            All questions were answered, and informed consent                            was obtained. Prior Anticoagulants: The patient has                            taken no previous anticoagulant or antiplatelet                            agents. ASA Grade Assessment: II - A patient with                            mild systemic disease. After reviewing the risks                            and benefits, the patient was deemed in                            satisfactory condition to undergo the procedure.                           After obtaining informed consent, the colonoscope  was passed under direct vision. Throughout the                            procedure, the patient's blood pressure, pulse, and                            oxygen saturations were monitored continuously. The                            Olympus CF-HQ190L ([id number]) Colonoscope was                            introduced through the anus and advanced to the the                            terminal ileum. The colonoscopy was performed                             without difficulty. The patient tolerated the                            procedure well. The quality of the bowel                            preparation was good. The terminal ileum, ileocecal                            valve, appendiceal orifice, and rectum were                            photographed. Scope In: 1:37:22 PM Scope Out: 2:03:37 PM Scope Withdrawal Time: 0 hours 21 minutes 37 seconds  Total Procedure Duration: 0 hours 26 minutes 15 seconds  Findings:                 The terminal ileum appeared normal.                           A 6 mm polyp was found in the ascending colon. The                            polyp was sessile. The polyp was removed with a                            cold snare. Resection and retrieval were complete.                           A 3 mm polyp was found in the transverse colon. The                            polyp was sessile. The polyp was removed with a                            cold snare. Resection and retrieval  were complete.                           Non-bleeding internal hemorrhoids were found during                            retroflexion.                           Biopsies for histology were taken with a cold                            forceps from the entire colon for evaluation of                            microscopic colitis. Complications:            No immediate complications. Estimated Blood Loss:     Estimated blood loss was minimal. Impression:               - The examined portion of the ileum was normal.                           - One 6 mm polyp in the ascending colon, removed                            with a cold snare. Resected and retrieved.                           - One 3 mm polyp in the transverse colon, removed                            with a cold snare. Resected and retrieved.                           - Non-bleeding internal hemorrhoids.                           - Biopsies were taken with a  cold forceps from the                            entire colon for evaluation of microscopic colitis. Recommendation:           - Discharge patient to home (with escort).                           - Await pathology results.                           - Anusol HC cream BID for 7 days.                           - The findings and recommendations were discussed                            with the patient.                           -  Return to GI clinic in 4 weeks. [address]Christia [name],  08/03/2021 2:08:19 PM

## 2021-08-03 NOTE — Patient Instructions (Signed)
Handout given for polyps.  YOU HAD AN ENDOSCOPIC PROCEDURE TODAY AT [facility]:   Refer to the procedure report that was given to you for any specific questions about what was found during the examination.  If the procedure report does not answer your questions, please call your gastroenterologist to clarify.  If you requested that your care partner not be given the details of your procedure findings, then the procedure report has been included in a sealed envelope for you to review at your convenience later.  YOU SHOULD EXPECT: Some feelings of bloating in the abdomen. Passage of more gas than usual.  Walking can help get rid of the air that was put into your GI tract during the procedure and reduce the bloating. If you had a lower endoscopy (such as a colonoscopy or flexible sigmoidoscopy) you may notice spotting of blood in your stool or on the toilet paper. If you underwent a bowel prep for your procedure, you may not have a normal bowel movement for a few days.  Please Note:  You might notice some irritation and congestion in your nose or some drainage.  This is from the oxygen used during your procedure.  There is no need for concern and it should clear up in a day or so.  SYMPTOMS TO REPORT IMMEDIATELY:  Following lower endoscopy (colonoscopy):  Excessive amounts of blood in the stool  Significant tenderness or worsening of abdominal pains  Swelling of the abdomen that is new, acute  Fever of 100F or higher  For urgent or emergent issues, a gastroenterologist can be reached at any hour by calling [phone]. Do not use MyChart messaging for urgent concerns.    DIET:  We do recommend a small meal at first, but then you may proceed to your regular diet.  Drink plenty of fluids but you should avoid alcoholic beverages for 24 hours.  ACTIVITY:  You should plan to take it easy for the rest of today and you should NOT DRIVE or use heavy machinery until tomorrow (because  of the sedation medicines used during the test).    FOLLOW UP: Our staff will call the number listed on your records the next business day following your procedure.  We will call around 7:15- 8:00 am to check on you and address any questions or concerns that you may have regarding the information given to you following your procedure. If we do not reach you, we will leave a message.  If you develop any symptoms (ie: fever, flu-like symptoms, shortness of breath, cough etc.) before then, please call [phone].  If you test positive for Covid 19 in the 2 weeks post procedure, please call and report this information to [place].    If any biopsies were taken you will be contacted by phone or by letter within the next 1-3 weeks.  Please call us at [phone] if you have not heard about the biopsies in 3 weeks.    SIGNATURES/CONFIDENTIALITY: You and/or your care partner have signed paperwork which will be entered into your electronic medical record.  These signatures attest to the fact that that the information above on your After Visit Summary has been reviewed and is understood.  Full responsibility of the confidentiality of this discharge information lies with you and/or your care-partner.

## 2021-08-06 ENCOUNTER — Telehealth: Payer: Self-pay

## 2021-08-06 NOTE — Telephone Encounter (Signed)
  Follow up Call-     08/03/2021    1:02 PM  Call back number  Post procedure Call Back phone  # [phone] cell  Permission to leave phone message Yes   Follow up call, LVM

## 2021-08-07 DIAGNOSIS — J301 Allergic rhinitis due to pollen: Secondary | ICD-10-CM | POA: Diagnosis not present

## 2021-08-07 DIAGNOSIS — J3089 Other allergic rhinitis: Secondary | ICD-10-CM | POA: Diagnosis not present

## 2021-08-07 DIAGNOSIS — J3081 Allergic rhinitis due to animal (cat) (dog) hair and dander: Secondary | ICD-10-CM | POA: Diagnosis not present

## 2021-08-08 ENCOUNTER — Encounter: Payer: Self-pay | Admitting: Internal Medicine

## 2021-08-14 DIAGNOSIS — J3089 Other allergic rhinitis: Secondary | ICD-10-CM | POA: Diagnosis not present

## 2021-08-14 DIAGNOSIS — J301 Allergic rhinitis due to pollen: Secondary | ICD-10-CM | POA: Diagnosis not present

## 2021-08-14 DIAGNOSIS — J3081 Allergic rhinitis due to animal (cat) (dog) hair and dander: Secondary | ICD-10-CM | POA: Diagnosis not present

## 2021-08-29 ENCOUNTER — Encounter: Payer: Self-pay | Admitting: Internal Medicine

## 2021-08-29 ENCOUNTER — Ambulatory Visit (INDEPENDENT_AMBULATORY_CARE_PROVIDER_SITE_OTHER): Payer: BC Managed Care – PPO | Admitting: Internal Medicine

## 2021-08-29 VITALS — BP 124/72 | HR 94 | Ht 63.0 in | Wt 170.0 lb

## 2021-08-29 DIAGNOSIS — R197 Diarrhea, unspecified: Secondary | ICD-10-CM

## 2021-08-29 DIAGNOSIS — K589 Irritable bowel syndrome without diarrhea: Secondary | ICD-10-CM | POA: Diagnosis not present

## 2021-08-29 MED ORDER — AMITRIPTYLINE HCL 25 MG PO TABS: 25.0000 mg | ORAL_TABLET | Freq: Every day | ORAL | 5 refills | Status: DC

## 2021-08-29 NOTE — Patient Instructions (Signed)
We have sent the following medications to your pharmacy for you to pick up at your convenience: amitriptyline.   The [facility] providers would like to encourage you to use [facility] to communicate with providers for non-urgent requests or questions.  Due to long hold times on the telephone, sending your provider a message by [facility] may be a faster and more efficient way to get a response.  Please allow 48 business hours for a response.  Please remember that this is for non-urgent requests.   Please follow up with Dr. Lorenso Courier in 2-3 months.

## 2021-08-29 NOTE — Progress Notes (Signed)
Chief Complaint: Diarrhea  HPI : 56 year old female with history of asthma and GERD presents for follow up of diarrhea.  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  Interval History: Her diarrhea has improved overall. She is still having on average 3 BMs per day. She does think that types of food she eats makes a difference in her bowel habits. She has been trying to eliminate certain items from the low FODMAP diet to see if it will help. She did notice that wine seemed to make her diarrhea worse. Stress also makes her diarrhea worse. She has been dealing with her husband's recent diagnosis of McNary.  Current Outpatient Medications  Medication Sig Dispense Refill   albuterol (PROAIR HFA) 108 (90 Base) MCG/ACT inhaler Inhale 2 puffs into the lungs every 4 (four) hours as needed for wheezing or shortness of breath. 1 Inhaler 5   ALPRAZolam (XANAX) 0.5 MG tablet TAKE 1 TABLET(0.5 MG) BY MOUTH AT BEDTIME AS NEEDED FOR SLEEP 30 tablet 1   azelastine (OPTIVAR) 0.05 % ophthalmic solution Place 1 drop into both eyes as needed.     Azelastine HCl 137 MCG/SPRAY SOLN Place 1 spray into the nose at bedtime.     Cyanocobalamin (VITAMIN B-12 PO) Take 1 tablet by mouth daily.     EPINEPHrine 0.3 mg/0.3 mL IJ SOAJ injection      fluticasone (FLONASE) 50 MCG/ACT nasal spray Place 1 spray into both nostrils as needed.     hydrocortisone (ANUSOL-HC) 2.5 % rectal cream Place 1 Application rectally 2 (two) times daily. Apply per rectum BID for 7 days 30 g 0   levocetirizine (XYZAL) 5 MG tablet Take 1 tablet by mouth daily.     montelukast (SINGULAIR) 10 MG tablet TAKE 1 TABLET BY MOUTH EVERY DAY IN THE EVENING FOR 7 DAYS     No current facility-administered medications for this visit.   Review of Systems: All systems reviewed and negative except where noted in HPI.   Physical Exam: BP 124/72   Pulse 94   Ht '5\' 3"'$  (1.6 m)   Wt 170 lb (77.1 kg)   BMI 30.11 kg/m  Constitutional:  Pleasant,well-developed, female in no acute distress. HEENT: Normocephalic and atraumatic. Conjunctivae are normal. No scleral icterus. Cardiovascular: Normal rate, regular rhythm.  Pulmonary/chest: Effort normal and breath sounds normal. No wheezing, rales or rhonchi. Abdominal: Soft, nondistended, nontender. Bowel sounds active throughout. There are no masses palpable. No hepatomegaly. Extremities: No edema Neurological: Alert and oriented to person place and time. Skin: Skin is warm and dry. No rashes noted. Psychiatric: Normal mood and affect. Behavior is normal.  Labs 01/2020: CBC nml. CMP nml. TSH nml.  EGD 02/27/09: Gastric fundic gland polyps  Colonoscopy 02/27/09: Rectal polyp (HP). Hemorrhoids.  EGD 10/04/14: Small hiatal hernia. No evidence of GERD. Fundic gland polyps, biopsy confirmed.  Colonoscopy 10/04/14: Normal colonoscopy  Colonoscopy 08/03/21: - The examined portion of the ileum was normal. - One 6 mm polyp in the ascending colon, removed with a cold snare. Resected and retrieved. - One 3 mm polyp in the transverse colon, removed with a cold snare. Resected and retrieved. - Non-bleeding internal hemorrhoids. - Biopsies were taken with a cold forceps from the entire colon for evaluation of microscopic colitis. Path: 1. Surgical [P], colon nos, random sites MILD NONSPECIFIC INFLAMMATION CONSISTENT WITH PREP RELATED CHANGES. THERE ARE NO DIAGNOSTIC FEATURES OF INFLAMMATORY BOWEL DISEASE, MICROSCOPIC COLITIS AND COLLAGENOUS COLITIS. 2. Surgical [P], colon, transverse and ascending, polyp (  2) SESSILE SERRATED POLYP WITHOUT CYTOLOGIC DYSPLASIA AND A HYPERPLASTIC POLYP.  ASSESSMENT AND PLAN:  Diarrhea IBS GERD History of colon polyps Patient presents with diarrhea that is likely due to IBS. Recent colonoscopy with biopsies did not show any signs of IBD or microscopic colitis. Patient has been working on identifying dietary triggers for her diarrhea by following the low  FODMAP diet. Will go ahead and start her on amitriptyline to see if this helps with her IBS and diarrhea  - Previously gave hand out on low FODMAP diet - Start amitriptyline 25 mg QHS  - Next colonoscopy due in 07/2026 - RTC 2-3 months  Christia Reading, MD  I spent 40 minutes of time, including in depth chart review, independent review of results as outlined above, communicating results with the patient directly, face-to-face time with the patient, coordinating care, ordering studies and medications as appropriate, and documentation.

## 2021-08-30 DIAGNOSIS — J301 Allergic rhinitis due to pollen: Secondary | ICD-10-CM | POA: Diagnosis not present

## 2021-08-30 DIAGNOSIS — J3089 Other allergic rhinitis: Secondary | ICD-10-CM | POA: Diagnosis not present

## 2021-08-30 DIAGNOSIS — J4530 Mild persistent asthma, uncomplicated: Secondary | ICD-10-CM | POA: Diagnosis not present

## 2021-08-30 DIAGNOSIS — H1045 Other chronic allergic conjunctivitis: Secondary | ICD-10-CM | POA: Diagnosis not present

## 2021-09-07 DIAGNOSIS — J3089 Other allergic rhinitis: Secondary | ICD-10-CM | POA: Diagnosis not present

## 2021-09-07 DIAGNOSIS — J3081 Allergic rhinitis due to animal (cat) (dog) hair and dander: Secondary | ICD-10-CM | POA: Diagnosis not present

## 2021-09-07 DIAGNOSIS — J301 Allergic rhinitis due to pollen: Secondary | ICD-10-CM | POA: Diagnosis not present

## 2021-09-18 DIAGNOSIS — J3089 Other allergic rhinitis: Secondary | ICD-10-CM | POA: Diagnosis not present

## 2021-09-18 DIAGNOSIS — J301 Allergic rhinitis due to pollen: Secondary | ICD-10-CM | POA: Diagnosis not present

## 2021-09-18 DIAGNOSIS — J3081 Allergic rhinitis due to animal (cat) (dog) hair and dander: Secondary | ICD-10-CM | POA: Diagnosis not present

## 2021-09-25 DIAGNOSIS — J301 Allergic rhinitis due to pollen: Secondary | ICD-10-CM | POA: Diagnosis not present

## 2021-09-26 DIAGNOSIS — J3089 Other allergic rhinitis: Secondary | ICD-10-CM | POA: Diagnosis not present

## 2021-09-26 DIAGNOSIS — J3081 Allergic rhinitis due to animal (cat) (dog) hair and dander: Secondary | ICD-10-CM | POA: Diagnosis not present

## 2021-09-27 DIAGNOSIS — J301 Allergic rhinitis due to pollen: Secondary | ICD-10-CM | POA: Diagnosis not present

## 2021-09-27 DIAGNOSIS — J3081 Allergic rhinitis due to animal (cat) (dog) hair and dander: Secondary | ICD-10-CM | POA: Diagnosis not present

## 2021-09-27 DIAGNOSIS — J3089 Other allergic rhinitis: Secondary | ICD-10-CM | POA: Diagnosis not present

## 2021-10-05 DIAGNOSIS — J3081 Allergic rhinitis due to animal (cat) (dog) hair and dander: Secondary | ICD-10-CM | POA: Diagnosis not present

## 2021-10-05 DIAGNOSIS — J301 Allergic rhinitis due to pollen: Secondary | ICD-10-CM | POA: Diagnosis not present

## 2021-10-05 DIAGNOSIS — J3089 Other allergic rhinitis: Secondary | ICD-10-CM | POA: Diagnosis not present

## 2021-10-11 DIAGNOSIS — J3089 Other allergic rhinitis: Secondary | ICD-10-CM | POA: Diagnosis not present

## 2021-10-11 DIAGNOSIS — J301 Allergic rhinitis due to pollen: Secondary | ICD-10-CM | POA: Diagnosis not present

## 2021-10-11 DIAGNOSIS — J3081 Allergic rhinitis due to animal (cat) (dog) hair and dander: Secondary | ICD-10-CM | POA: Diagnosis not present

## 2021-10-18 DIAGNOSIS — J301 Allergic rhinitis due to pollen: Secondary | ICD-10-CM | POA: Diagnosis not present

## 2021-10-18 DIAGNOSIS — J3081 Allergic rhinitis due to animal (cat) (dog) hair and dander: Secondary | ICD-10-CM | POA: Diagnosis not present

## 2021-10-18 DIAGNOSIS — J3089 Other allergic rhinitis: Secondary | ICD-10-CM | POA: Diagnosis not present

## 2021-10-25 DIAGNOSIS — J3089 Other allergic rhinitis: Secondary | ICD-10-CM | POA: Diagnosis not present

## 2021-10-25 DIAGNOSIS — J301 Allergic rhinitis due to pollen: Secondary | ICD-10-CM | POA: Diagnosis not present

## 2021-10-25 DIAGNOSIS — J3081 Allergic rhinitis due to animal (cat) (dog) hair and dander: Secondary | ICD-10-CM | POA: Diagnosis not present

## 2021-11-06 DIAGNOSIS — J3081 Allergic rhinitis due to animal (cat) (dog) hair and dander: Secondary | ICD-10-CM | POA: Diagnosis not present

## 2021-11-06 DIAGNOSIS — J301 Allergic rhinitis due to pollen: Secondary | ICD-10-CM | POA: Diagnosis not present

## 2021-11-06 DIAGNOSIS — J3089 Other allergic rhinitis: Secondary | ICD-10-CM | POA: Diagnosis not present

## 2021-11-13 ENCOUNTER — Ambulatory Visit: Payer: BC Managed Care – PPO | Admitting: Internal Medicine

## 2021-11-13 DIAGNOSIS — J301 Allergic rhinitis due to pollen: Secondary | ICD-10-CM | POA: Diagnosis not present

## 2021-11-13 DIAGNOSIS — J3089 Other allergic rhinitis: Secondary | ICD-10-CM | POA: Diagnosis not present

## 2021-11-13 DIAGNOSIS — J3081 Allergic rhinitis due to animal (cat) (dog) hair and dander: Secondary | ICD-10-CM | POA: Diagnosis not present

## 2021-11-20 DIAGNOSIS — J3089 Other allergic rhinitis: Secondary | ICD-10-CM | POA: Diagnosis not present

## 2021-11-20 DIAGNOSIS — J3081 Allergic rhinitis due to animal (cat) (dog) hair and dander: Secondary | ICD-10-CM | POA: Diagnosis not present

## 2021-11-20 DIAGNOSIS — J301 Allergic rhinitis due to pollen: Secondary | ICD-10-CM | POA: Diagnosis not present

## 2021-11-21 ENCOUNTER — Ambulatory Visit: Payer: Self-pay

## 2021-11-30 DIAGNOSIS — N390 Urinary tract infection, site not specified: Secondary | ICD-10-CM | POA: Diagnosis not present

## 2021-11-30 DIAGNOSIS — R300 Dysuria: Secondary | ICD-10-CM | POA: Diagnosis not present

## 2021-12-06 ENCOUNTER — Other Ambulatory Visit: Payer: Self-pay | Admitting: Internal Medicine

## 2021-12-11 ENCOUNTER — Telehealth: Payer: Self-pay | Admitting: Internal Medicine

## 2021-12-11 NOTE — Telephone Encounter (Signed)
Patient called wondering if she could get a flu shot when she comes to her appointment. Requesting a call back. Please call to advise.

## 2021-12-11 NOTE — Telephone Encounter (Signed)
Called patient. No answer. Left her a message that we are not able to give her the flu vaccine.

## 2021-12-14 DIAGNOSIS — J3089 Other allergic rhinitis: Secondary | ICD-10-CM | POA: Diagnosis not present

## 2021-12-14 DIAGNOSIS — J3081 Allergic rhinitis due to animal (cat) (dog) hair and dander: Secondary | ICD-10-CM | POA: Diagnosis not present

## 2021-12-14 DIAGNOSIS — J301 Allergic rhinitis due to pollen: Secondary | ICD-10-CM | POA: Diagnosis not present

## 2021-12-17 ENCOUNTER — Encounter: Payer: Self-pay | Admitting: Internal Medicine

## 2021-12-17 ENCOUNTER — Other Ambulatory Visit: Payer: Self-pay | Admitting: Internal Medicine

## 2021-12-17 ENCOUNTER — Ambulatory Visit (INDEPENDENT_AMBULATORY_CARE_PROVIDER_SITE_OTHER): Payer: BC Managed Care – PPO | Admitting: Internal Medicine

## 2021-12-17 VITALS — BP 124/82 | HR 84 | Ht 63.0 in | Wt 172.0 lb

## 2021-12-17 DIAGNOSIS — K589 Irritable bowel syndrome without diarrhea: Secondary | ICD-10-CM

## 2021-12-17 DIAGNOSIS — R197 Diarrhea, unspecified: Secondary | ICD-10-CM

## 2021-12-17 MED ORDER — AMITRIPTYLINE HCL 25 MG PO TABS: 12.5000 mg | ORAL_TABLET | Freq: Every day | ORAL | 5 refills | Status: DC

## 2021-12-17 MED ORDER — DICYCLOMINE HCL 10 MG PO CAPS: 10.0000 mg | ORAL_CAPSULE | Freq: Three times a day (TID) | ORAL | 1 refills | Status: DC | PRN

## 2021-12-17 NOTE — Progress Notes (Signed)
Chief Complaint: Diarrhea  HPI : 56 year old female with history of asthma and GERD presents for follow up of diarrhea.  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  Interval History: She has still been having some diarrhea. Her husband got his liver transplant 2 weeks ago at [facility] so she has had extra stressors recently. She has not had as much time to focus on her own diet. She started the amitriptyline 25 mg QD, but this caused her to feel sedated so she halved it to 12.5 mg QD. The amitriptyline has been helping her sleep. She developed a UTI and finished her antibiotics about a week ago. She is having about 3-4 BMs per day. She occasionally takes Pepto Bismol. Endorses some cramping in her RUQ. The RUQ cramping occasionally worsens after she eats and sometimes improves after she has a BM.   Current Outpatient Medications  Medication Sig Dispense Refill   albuterol (PROAIR HFA) 108 (90 Base) MCG/ACT inhaler Inhale 2 puffs into the lungs every 4 (four) hours as needed for wheezing or shortness of breath. 1 Inhaler 5   ALPRAZolam (XANAX) 0.5 MG tablet TAKE 1 TABLET(0.5 MG) BY MOUTH AT BEDTIME AS NEEDED FOR SLEEP 30 tablet 0   amitriptyline (ELAVIL) 25 MG tablet Take 1 tablet (25 mg total) by mouth at bedtime. 30 tablet 5   azelastine (OPTIVAR) 0.05 % ophthalmic solution Place 1 drop into both eyes as needed.     Azelastine HCl 137 MCG/SPRAY SOLN Place 1 spray into the nose at bedtime.     Cyanocobalamin (VITAMIN B-12 PO) Take 1 tablet by mouth daily.     EPINEPHrine 0.3 mg/0.3 mL IJ SOAJ injection      fluticasone (FLONASE) 50 MCG/ACT nasal spray Place 1 spray into both nostrils as needed.     hydrocortisone (ANUSOL-HC) 2.5 % rectal cream Place 1 Application rectally 2 (two) times daily. Apply per rectum BID for 7 days 30 g 0   levocetirizine (XYZAL) 5 MG tablet Take 1 tablet by mouth daily.     montelukast (SINGULAIR) 10 MG tablet TAKE 1 TABLET BY MOUTH EVERY DAY IN THE EVENING FOR 7  DAYS     No current facility-administered medications for this visit.   Review of Systems: All systems reviewed and negative except where noted in HPI.   Physical Exam: BP 124/82   Pulse 84   Ht _0  (1.6 m)   Wt 172 lb (78 kg)   BMI 30.47 kg/m  Constitutional: Pleasant,well-developed, female in no acute distress. HEENT: Normocephalic and atraumatic. Conjunctivae are normal. No scleral icterus. Cardiovascular: Normal rate, regular rhythm.  Pulmonary/chest: Effort normal and breath sounds normal. No wheezing, rales or rhonchi. Abdominal: Soft, nondistended, nontender. Bowel sounds active throughout. There are no masses palpable. No hepatomegaly. Extremities: No edema Neurological: Alert and oriented to person place and time. Skin: Skin is warm and dry. No rashes noted. Psychiatric: Normal mood and affect. Behavior is normal.  Labs 01/2020: CBC nml. CMP nml. TSH nml.  EGD 02/27/09: Gastric fundic gland polyps  Colonoscopy 02/27/09: Rectal polyp (HP). Hemorrhoids.  EGD 10/04/14: Small hiatal hernia. No evidence of GERD. Fundic gland polyps, biopsy confirmed.  Colonoscopy 10/04/14: Normal colonoscopy  Colonoscopy 08/03/21: - The examined portion of the ileum was normal. - One 6 mm polyp in the ascending colon, removed with a cold snare. Resected and retrieved. - One 3 mm polyp in the transverse colon, removed with a cold snare. Resected and retrieved. - Non-bleeding  internal hemorrhoids. - Biopsies were taken with a cold forceps from the entire colon for evaluation of microscopic colitis. Path: 1. Surgical [P], colon nos, random sites MILD NONSPECIFIC INFLAMMATION CONSISTENT WITH PREP RELATED CHANGES. THERE ARE NO DIAGNOSTIC FEATURES OF INFLAMMATORY BOWEL DISEASE, MICROSCOPIC COLITIS AND COLLAGENOUS COLITIS. 2. Surgical [P], colon, transverse and ascending, polyp (2) SESSILE SERRATED POLYP WITHOUT CYTOLOGIC DYSPLASIA AND A HYPERPLASTIC POLYP.  ASSESSMENT AND  PLAN:  Diarrhea IBS GERD History of colon polyps Patient presents for follow up of diarrhea that is likely due to IBS. Amitriptyline at 25 mg QHS made her feel sedated so she is taking a half dose. Overall she is still improved from when I first met her but she is still having some symptoms at this time. Will give her some Bentyl to use PRN for abdominal cramping and have her try fiber supplement and/or probiotic to see if this helps with her symptoms. - Previously gave hand out on low FODMAP diet - Cont amitriptyline 12.5 mg QHS - Bentyl 10 mg TID PRN - Okay try fiber supplement and/or Florastor - Next colonoscopy due in 07/2026 - RTC 6 months  Christia Reading, MD  I spent 32 minutes of time, including in depth chart review, independent review of results as outlined above, communicating results with the patient directly, face-to-face time with the patient, coordinating care, and ordering studies and medications as appropriate, and documentation.

## 2021-12-17 NOTE — Patient Instructions (Addendum)
  If you are age 54 or older, your body mass index should be between 23-30. Your Body mass index is 30.47 kg/m. If this is out of the aforementioned range listed, please consider follow up with your Primary Care Provider.  If you are age 82 or younger, your body mass index should be between 19-25. Your Body mass index is 30.47 kg/m. If this is out of the aformentioned range listed, please consider follow up with your Primary Care Provider.   ________________________________________________________  The [facility] providers would like to encourage you to use [facility] to communicate with providers for non-urgent requests or questions.  Due to long hold times on the telephone, sending your provider a message by [facility] may be a faster and more efficient way to get a response.  Please allow 48 business hours for a response.  Please remember that this is for non-urgent requests.   We have sent the following medications to your pharmacy for you to pick up at your convenience: Start Bentyl 10 mg 1 tablet 3 time daily as needed for abdominal cramping and pain Decreasing amitriptyline 12.5 mg at bedtime each night   Please purchase the following medications over the counter and take as directed:  Okay try fiber supplement (metamucil or benefiber) and Florastor   You will need a follow up appointment in 6 months. We will contact you to schedule appointment   Thank you for entrusting me with your care and choosing [facility].  Dr Lorenso Courier

## 2021-12-25 DIAGNOSIS — J3089 Other allergic rhinitis: Secondary | ICD-10-CM | POA: Diagnosis not present

## 2021-12-25 DIAGNOSIS — J301 Allergic rhinitis due to pollen: Secondary | ICD-10-CM | POA: Diagnosis not present

## 2021-12-25 DIAGNOSIS — J3081 Allergic rhinitis due to animal (cat) (dog) hair and dander: Secondary | ICD-10-CM | POA: Diagnosis not present

## 2022-01-01 DIAGNOSIS — J3081 Allergic rhinitis due to animal (cat) (dog) hair and dander: Secondary | ICD-10-CM | POA: Diagnosis not present

## 2022-01-01 DIAGNOSIS — J301 Allergic rhinitis due to pollen: Secondary | ICD-10-CM | POA: Diagnosis not present

## 2022-01-01 DIAGNOSIS — J3089 Other allergic rhinitis: Secondary | ICD-10-CM | POA: Diagnosis not present

## 2022-01-07 DIAGNOSIS — Z1231 Encounter for screening mammogram for malignant neoplasm of breast: Secondary | ICD-10-CM | POA: Diagnosis not present

## 2022-01-10 DIAGNOSIS — J3089 Other allergic rhinitis: Secondary | ICD-10-CM | POA: Diagnosis not present

## 2022-01-10 DIAGNOSIS — J301 Allergic rhinitis due to pollen: Secondary | ICD-10-CM | POA: Diagnosis not present

## 2022-01-10 DIAGNOSIS — J3081 Allergic rhinitis due to animal (cat) (dog) hair and dander: Secondary | ICD-10-CM | POA: Diagnosis not present

## 2022-01-15 DIAGNOSIS — J3089 Other allergic rhinitis: Secondary | ICD-10-CM | POA: Diagnosis not present

## 2022-01-15 DIAGNOSIS — J3081 Allergic rhinitis due to animal (cat) (dog) hair and dander: Secondary | ICD-10-CM | POA: Diagnosis not present

## 2022-01-15 DIAGNOSIS — J301 Allergic rhinitis due to pollen: Secondary | ICD-10-CM | POA: Diagnosis not present

## 2022-01-24 DIAGNOSIS — J3089 Other allergic rhinitis: Secondary | ICD-10-CM | POA: Diagnosis not present

## 2022-01-24 DIAGNOSIS — J301 Allergic rhinitis due to pollen: Secondary | ICD-10-CM | POA: Diagnosis not present

## 2022-01-24 DIAGNOSIS — J3081 Allergic rhinitis due to animal (cat) (dog) hair and dander: Secondary | ICD-10-CM | POA: Diagnosis not present

## 2022-01-31 ENCOUNTER — Encounter: Payer: Self-pay | Admitting: Internal Medicine

## 2022-01-31 ENCOUNTER — Ambulatory Visit (INDEPENDENT_AMBULATORY_CARE_PROVIDER_SITE_OTHER): Payer: BC Managed Care – PPO | Admitting: Internal Medicine

## 2022-01-31 VITALS — BP 120/72 | HR 70 | Temp 98.4°F | Ht 63.0 in | Wt 173.0 lb

## 2022-01-31 DIAGNOSIS — F439 Reaction to severe stress, unspecified: Secondary | ICD-10-CM

## 2022-01-31 DIAGNOSIS — J4521 Mild intermittent asthma with (acute) exacerbation: Secondary | ICD-10-CM | POA: Diagnosis not present

## 2022-01-31 DIAGNOSIS — N393 Stress incontinence (female) (male): Secondary | ICD-10-CM | POA: Insufficient documentation

## 2022-01-31 DIAGNOSIS — Z23 Encounter for immunization: Secondary | ICD-10-CM | POA: Diagnosis not present

## 2022-01-31 DIAGNOSIS — R5383 Other fatigue: Secondary | ICD-10-CM

## 2022-01-31 DIAGNOSIS — Z0000 Encounter for general adult medical examination without abnormal findings: Secondary | ICD-10-CM | POA: Diagnosis not present

## 2022-01-31 LAB — COMPREHENSIVE METABOLIC PANEL
ALT: 20 U/L (ref 0–35)
AST: 14 U/L (ref 0–37)
Albumin: 4.6 g/dL (ref 3.5–5.2)
Alkaline Phosphatase: 77 U/L (ref 39–117)
BUN: 11 mg/dL (ref 6–23)
CO2: 28 mEq/L (ref 19–32)
Calcium: 10.1 mg/dL (ref 8.4–10.5)
Chloride: 104 mEq/L (ref 96–112)
Creatinine, Ser: 0.69 mg/dL (ref 0.40–1.20)
GFR: 98.15 mL/min (ref >60.00)
Glucose, Bld: 90 mg/dL (ref 70–99)
Potassium: 4.1 mEq/L (ref 3.5–5.1)
Sodium: 140 mEq/L (ref 135–145)
Total Bilirubin: 0.4 mg/dL (ref 0.2–1.2)
Total Protein: 7.5 g/dL (ref 6.0–8.3)

## 2022-01-31 LAB — URINALYSIS, ROUTINE W REFLEX MICROSCOPIC
Bilirubin Urine: NEGATIVE
Hgb urine dipstick: NEGATIVE
Ketones, ur: NEGATIVE
Nitrite: NEGATIVE
RBC / HPF: NONE SEEN (ref 0–?)
Specific Gravity, Urine: <=1.005 — AB (ref 1.000–1.030)
Total Protein, Urine: NEGATIVE
Urine Glucose: NEGATIVE
Urobilinogen, UA: 0.2 (ref 0.0–1.0)
pH: 6 (ref 5.0–8.0)

## 2022-01-31 LAB — CBC WITH DIFFERENTIAL/PLATELET
Basophils Absolute: 0 10*3/uL (ref 0.0–0.1)
Basophils Relative: 0.6 % (ref 0.0–3.0)
Eosinophils Absolute: 0.3 10*3/uL (ref 0.0–0.7)
Eosinophils Relative: 3.3 % (ref 0.0–5.0)
HCT: 39.2 % (ref 36.0–46.0)
Hemoglobin: 13.4 g/dL (ref 12.0–15.0)
Lymphocytes Relative: 34.9 % (ref 12.0–46.0)
Lymphs Abs: 2.7 10*3/uL (ref 0.7–4.0)
MCHC: 34.2 g/dL (ref 30.0–36.0)
MCV: 93.3 fl (ref 78.0–100.0)
Monocytes Absolute: 0.7 10*3/uL (ref 0.1–1.0)
Monocytes Relative: 8.7 % (ref 3.0–12.0)
Neutro Abs: 4 10*3/uL (ref 1.4–7.7)
Neutrophils Relative %: 52.5 % (ref 43.0–77.0)
Platelets: 313 10*3/uL (ref 150.0–400.0)
RBC: 4.2 Mil/uL (ref 3.87–5.11)
RDW: 12.6 % (ref 11.5–15.5)
WBC: 7.7 10*3/uL (ref 4.0–10.5)

## 2022-01-31 LAB — LIPID PANEL
Cholesterol: 269 mg/dL — ABNORMAL HIGH (ref 0–200)
HDL: 69.8 mg/dL (ref >39.00)
LDL Cholesterol: 175 mg/dL — ABNORMAL HIGH (ref 0–99)
NonHDL: 198.95
Total CHOL/HDL Ratio: 4
Triglycerides: 121 mg/dL (ref 0.0–149.0)
VLDL: 24.2 mg/dL (ref 0.0–40.0)

## 2022-01-31 LAB — VITAMIN D 25 HYDROXY (VIT D DEFICIENCY, FRACTURES): VITD: 31.22 ng/mL (ref 30.00–100.00)

## 2022-01-31 LAB — VITAMIN B12: Vitamin B-12: 460 pg/mL (ref 211–911)

## 2022-01-31 LAB — TSH: TSH: 1.52 u[IU]/mL (ref 0.35–5.50)

## 2022-01-31 MED ORDER — VENLAFAXINE HCL ER 37.5 MG PO CP24: 37.5000 mg | ORAL_CAPSULE | Freq: Every day | ORAL | 3 refills | Status: DC

## 2022-01-31 MED ORDER — ZEPBOUND 2.5 MG/0.5ML ~~LOC~~ SOAJ: 2.5000 mg | SUBCUTANEOUS | 3 refills | Status: DC

## 2022-01-31 NOTE — Progress Notes (Signed)
Subjective:  Patient ID: Shannon [name], female    DOB: 1965/10/21  Age: 57 y.o. MRN: [id number]  CC: Annual Exam   HPI Shannon [name] presents for a well exam C/o IBS-D - Dr Lorenso Courier  C/o stress, grieving parents' death    Outpatient Medications Prior to Visit  Medication Sig Dispense Refill   albuterol (PROAIR HFA) 108 (90 Base) MCG/ACT inhaler Inhale 2 puffs into the lungs every 4 (four) hours as needed for wheezing or shortness of breath. 1 Inhaler 5   ALPRAZolam (XANAX) 0.5 MG tablet TAKE 1 TABLET(0.5 MG) BY MOUTH AT BEDTIME AS NEEDED FOR SLEEP 30 tablet 0   azelastine (OPTIVAR) 0.05 % ophthalmic solution Place 1 drop into both eyes as needed.     Azelastine HCl 137 MCG/SPRAY SOLN Place 1 spray into the nose at bedtime.     azithromycin (ZITHROMAX) 250 MG tablet      Cyanocobalamin (VITAMIN B-12 PO) Take 1 tablet by mouth daily.     EPINEPHrine 0.3 mg/0.3 mL IJ SOAJ injection      fluconazole (DIFLUCAN) 150 MG tablet TAKE 1 TABLET BY MOUTH ONCE FOR 1 DAY     fluticasone (FLONASE) 50 MCG/ACT nasal spray Place 1 spray into both nostrils as needed.     hydrocortisone (ANUSOL-HC) 2.5 % rectal cream Place 1 Application rectally 2 (two) times daily. Apply per rectum BID for 7 days 30 g 0   levocetirizine (XYZAL) 5 MG tablet Take 1 tablet by mouth daily.     montelukast (SINGULAIR) 10 MG tablet TAKE 1 TABLET BY MOUTH EVERY DAY IN THE EVENING FOR 7 DAYS     dicyclomine (BENTYL) 10 MG capsule TAKE 1 CAPSULE(10 MG) BY MOUTH THREE TIMES DAILY AS NEEDED FOR SPASMS (Patient not taking: Reported on 01/31/2022) 270 capsule 1   amitriptyline (ELAVIL) 25 MG tablet Take 0.5 tablets (12.5 mg total) by mouth at bedtime. 30 tablet 5   No facility-administered medications prior to visit.    ROS: Review of Systems  Constitutional:  Negative for activity change, appetite change, chills, fatigue and unexpected weight change.  HENT:  Negative for congestion, mouth sores and sinus pressure.    Eyes:  Negative for visual disturbance.  Respiratory:  Negative for cough and chest tightness.   Gastrointestinal:  Negative for abdominal pain and nausea.  Genitourinary:  Negative for difficulty urinating, frequency and vaginal pain.  Musculoskeletal:  Negative for back pain and gait problem.  Skin:  Negative for pallor and rash.  Neurological:  Negative for dizziness, tremors, weakness, numbness and headaches.  Psychiatric/Behavioral:  Negative for confusion and sleep disturbance.     Objective:  BP 120/72 (BP Location: Left Arm, Patient Position: Sitting, Cuff Size: Normal)   Pulse 70   Temp 98.4 F (36.9 C) (Oral)   Ht '5\' 3"'$  (1.6 m)   Wt 173 lb (78.5 kg)   SpO2 98%   BMI 30.65 kg/m   BP Readings from Last 3 Encounters:  01/31/22 120/72  12/17/21 124/82  08/29/21 124/72    Wt Readings from Last 3 Encounters:  01/31/22 173 lb (78.5 kg)  12/17/21 172 lb (78 kg)  08/29/21 170 lb (77.1 kg)    Physical Exam Constitutional:      General: She is not in acute distress.    Appearance: She is well-developed. She is obese.  HENT:     Head: Normocephalic.     Right Ear: External ear normal.     Left Ear: External ear normal.  Nose: Nose normal.  Eyes:     General:        Right eye: No discharge.        Left eye: No discharge.     Conjunctiva/sclera: Conjunctivae normal.     Pupils: Pupils are equal, round, and reactive to light.  Neck:     Thyroid: No thyromegaly.     Vascular: No JVD.     Trachea: No tracheal deviation.  Cardiovascular:     Rate and Rhythm: Normal rate and regular rhythm.     Heart sounds: Normal heart sounds.  Pulmonary:     Effort: No respiratory distress.     Breath sounds: No stridor. No wheezing.  Abdominal:     General: Bowel sounds are normal. There is no distension.     Palpations: Abdomen is soft. There is no mass.     Tenderness: There is no abdominal tenderness. There is no guarding or rebound.  Musculoskeletal:        General:  No tenderness.     Cervical back: Normal range of motion and neck supple. No rigidity.  Lymphadenopathy:     Cervical: No cervical adenopathy.  Skin:    Findings: No erythema or rash.  Neurological:     Mental Status: She is oriented to person, place, and time.     Cranial Nerves: No cranial nerve deficit.     Motor: No abnormal muscle tone.     Coordination: Coordination normal.     Deep Tendon Reflexes: Reflexes normal.  Psychiatric:        Behavior: Behavior normal.        Thought Content: Thought content normal.        Judgment: Judgment normal.     Lab Results  Component Value Date   WBC 7.7 01/31/2022   HGB 13.4 01/31/2022   HCT 39.2 01/31/2022   PLT 313.0 01/31/2022   GLUCOSE 90 01/31/2022   CHOL 269 (H) 01/31/2022   TRIG 121.0 01/31/2022   HDL 69.80 01/31/2022   LDLCALC 175 (H) 01/31/2022   ALT 20 01/31/2022   AST 14 01/31/2022   NA 140 01/31/2022   K 4.1 01/31/2022   CL 104 01/31/2022   CREATININE 0.69 01/31/2022   BUN 11 01/31/2022   CO2 28 01/31/2022   TSH 1.52 01/31/2022   INR 1.2 (H) 11/18/2012   HGBA1C 5.2 12/23/2012    CT CARDIAC SCORING (SELF PAY ONLY)  Addendum Date: 03/28/2020   ADDENDUM REPORT: 03/28/2020 08:49 CLINICAL DATA:  Risk stratification EXAM: Coronary Calcium Score TECHNIQUE: The patient was scanned on a [employer] scanner. Axial non-contrast 3 mm slices were carried out through the heart. The data set was analyzed on a dedicated work station and scored using the Vina. FINDINGS: Non-cardiac: See separate report from [facility]. Ascending Aorta: Normal caliber Pericardium: Normal Coronary arteries: Normal origins IMPRESSION: Coronary calcium score of 0. This is a low risk study. Electronically Signed   By: Pixie Casino M.D.   On: 03/28/2020 08:49   Result Date: 03/28/2020 EXAM: OVER-READ INTERPRETATION  CT CHEST The following report is an over-read performed by radiologist Dr. Rolm Baptise of [facility], [place] on  03/27/2020. This over-read does not include interpretation of cardiac or coronary anatomy or pathology. The coronary calcium score interpretation by the cardiologist is attached. COMPARISON:  None. FINDINGS: Vascular: Heart is normal size.  Aorta normal caliber. Mediastinum/Nodes: No adenopathy Lungs/Pleura: No confluent opacities or effusions. Upper Abdomen: Imaging into the upper abdomen demonstrates  no acute findings. Musculoskeletal: Chest wall soft tissues are unremarkable. No acute bony abnormality. IMPRESSION: No acute or significant extracardiac abnormality. Electronically Signed: By: Rolm Baptise M.D. On: 03/27/2020 10:34    Assessment & Plan:   Problem List Items Addressed This Visit       Respiratory   Asthma    Doing fairly well        Other   Well adult exam - Primary    We discussed age appropriate health related issues, including available/recomended screening tests and vaccinations. We discussed a need for adhering to healthy diet and exercise. Labs were ordered to be later reviewed . All questions were answered.   Colon 2016 Dr Earlean Shawl Dr Lorenso Courier - last colonoscopy 08/2021      Relevant Orders   TSH (Completed)   Urinalysis   CBC with Differential/Platelet (Completed)   Lipid panel (Completed)   Comprehensive metabolic panel (Completed)   Stress at home    [facility] had liver transplant.  Stress discussed      Fatigue   Relevant Orders   VITAMIN D 25 Hydroxy (Vit-D Deficiency, Fractures) (Completed)   Vitamin B12 (Completed)   Other Visit Diagnoses     Flu vaccine need       Relevant Orders   Flu Vaccine QUAD 6+ mos PF IM (Fluarix Quad PF) (Completed)   Need for zoster vaccination       Relevant Orders   Varicella-zoster vaccine IM (Completed)         Meds ordered this encounter  Medications   venlafaxine XR (EFFEXOR XR) 37.5 MG 24 hr capsule    Sig: Take 1 capsule (37.5 mg total) by mouth daily with breakfast.    Dispense:  30 capsule    Refill:  3    tirzepatide (ZEPBOUND) 2.5 MG/0.5ML Pen    Sig: Inject 2.5 mg into the skin once a week.    Dispense:  2 mL    Refill:  3      Follow-up: Return in about 3 months (around 05/02/2022) for a follow-up visit.  Walker Kehr, MD

## 2022-01-31 NOTE — Assessment & Plan Note (Signed)
We discussed age appropriate health related issues, including available/recomended screening tests and vaccinations. We discussed a need for adhering to healthy diet and exercise. Labs were ordered to be later reviewed . All questions were answered.   Colon 2016 Dr Earlean Shawl Dr Lorenso Courier - last colonoscopy 08/2021

## 2022-02-05 ENCOUNTER — Encounter: Payer: Self-pay | Admitting: Internal Medicine

## 2022-02-07 DIAGNOSIS — J301 Allergic rhinitis due to pollen: Secondary | ICD-10-CM | POA: Diagnosis not present

## 2022-02-07 DIAGNOSIS — J3081 Allergic rhinitis due to animal (cat) (dog) hair and dander: Secondary | ICD-10-CM | POA: Diagnosis not present

## 2022-02-07 DIAGNOSIS — J3089 Other allergic rhinitis: Secondary | ICD-10-CM | POA: Diagnosis not present

## 2022-02-10 DIAGNOSIS — F439 Reaction to severe stress, unspecified: Secondary | ICD-10-CM | POA: Insufficient documentation

## 2022-02-10 NOTE — Assessment & Plan Note (Signed)
Doing fairly well

## 2022-02-10 NOTE — Assessment & Plan Note (Signed)
Shannon [name] had liver transplant.  Stress discussed

## 2022-02-14 DIAGNOSIS — J3081 Allergic rhinitis due to animal (cat) (dog) hair and dander: Secondary | ICD-10-CM | POA: Diagnosis not present

## 2022-02-14 DIAGNOSIS — J301 Allergic rhinitis due to pollen: Secondary | ICD-10-CM | POA: Diagnosis not present

## 2022-02-14 DIAGNOSIS — J3089 Other allergic rhinitis: Secondary | ICD-10-CM | POA: Diagnosis not present

## 2022-02-21 DIAGNOSIS — J3089 Other allergic rhinitis: Secondary | ICD-10-CM | POA: Diagnosis not present

## 2022-02-21 DIAGNOSIS — J301 Allergic rhinitis due to pollen: Secondary | ICD-10-CM | POA: Diagnosis not present

## 2022-02-21 DIAGNOSIS — J3081 Allergic rhinitis due to animal (cat) (dog) hair and dander: Secondary | ICD-10-CM | POA: Diagnosis not present

## 2022-02-25 DIAGNOSIS — L820 Inflamed seborrheic keratosis: Secondary | ICD-10-CM | POA: Diagnosis not present

## 2022-02-25 DIAGNOSIS — L245 Irritant contact dermatitis due to other chemical products: Secondary | ICD-10-CM | POA: Diagnosis not present

## 2022-02-25 DIAGNOSIS — L821 Other seborrheic keratosis: Secondary | ICD-10-CM | POA: Diagnosis not present

## 2022-02-25 DIAGNOSIS — D2261 Melanocytic nevi of right upper limb, including shoulder: Secondary | ICD-10-CM | POA: Diagnosis not present

## 2022-02-25 DIAGNOSIS — L812 Freckles: Secondary | ICD-10-CM | POA: Diagnosis not present

## 2022-02-28 DIAGNOSIS — J301 Allergic rhinitis due to pollen: Secondary | ICD-10-CM | POA: Diagnosis not present

## 2022-02-28 DIAGNOSIS — J3089 Other allergic rhinitis: Secondary | ICD-10-CM | POA: Diagnosis not present

## 2022-02-28 DIAGNOSIS — J3081 Allergic rhinitis due to animal (cat) (dog) hair and dander: Secondary | ICD-10-CM | POA: Diagnosis not present

## 2022-03-06 ENCOUNTER — Encounter: Payer: Self-pay | Admitting: Internal Medicine

## 2022-03-08 DIAGNOSIS — J3081 Allergic rhinitis due to animal (cat) (dog) hair and dander: Secondary | ICD-10-CM | POA: Diagnosis not present

## 2022-03-08 DIAGNOSIS — J301 Allergic rhinitis due to pollen: Secondary | ICD-10-CM | POA: Diagnosis not present

## 2022-03-08 DIAGNOSIS — J3089 Other allergic rhinitis: Secondary | ICD-10-CM | POA: Diagnosis not present

## 2022-03-15 DIAGNOSIS — J3081 Allergic rhinitis due to animal (cat) (dog) hair and dander: Secondary | ICD-10-CM | POA: Diagnosis not present

## 2022-03-15 DIAGNOSIS — J3089 Other allergic rhinitis: Secondary | ICD-10-CM | POA: Diagnosis not present

## 2022-03-15 DIAGNOSIS — J301 Allergic rhinitis due to pollen: Secondary | ICD-10-CM | POA: Diagnosis not present

## 2022-03-26 ENCOUNTER — Encounter: Payer: Self-pay | Admitting: Internal Medicine

## 2022-03-29 DIAGNOSIS — J301 Allergic rhinitis due to pollen: Secondary | ICD-10-CM | POA: Diagnosis not present

## 2022-03-29 DIAGNOSIS — J3081 Allergic rhinitis due to animal (cat) (dog) hair and dander: Secondary | ICD-10-CM | POA: Diagnosis not present

## 2022-03-29 DIAGNOSIS — J3089 Other allergic rhinitis: Secondary | ICD-10-CM | POA: Diagnosis not present

## 2022-04-01 ENCOUNTER — Encounter: Payer: Self-pay | Admitting: Internal Medicine

## 2022-04-05 DIAGNOSIS — J301 Allergic rhinitis due to pollen: Secondary | ICD-10-CM | POA: Diagnosis not present

## 2022-04-05 DIAGNOSIS — J3081 Allergic rhinitis due to animal (cat) (dog) hair and dander: Secondary | ICD-10-CM | POA: Diagnosis not present

## 2022-04-05 DIAGNOSIS — J3089 Other allergic rhinitis: Secondary | ICD-10-CM | POA: Diagnosis not present

## 2022-04-12 DIAGNOSIS — J301 Allergic rhinitis due to pollen: Secondary | ICD-10-CM | POA: Diagnosis not present

## 2022-04-12 DIAGNOSIS — J3089 Other allergic rhinitis: Secondary | ICD-10-CM | POA: Diagnosis not present

## 2022-04-12 DIAGNOSIS — J3081 Allergic rhinitis due to animal (cat) (dog) hair and dander: Secondary | ICD-10-CM | POA: Diagnosis not present

## 2022-04-19 DIAGNOSIS — J3089 Other allergic rhinitis: Secondary | ICD-10-CM | POA: Diagnosis not present

## 2022-04-19 DIAGNOSIS — J301 Allergic rhinitis due to pollen: Secondary | ICD-10-CM | POA: Diagnosis not present

## 2022-04-19 DIAGNOSIS — J3081 Allergic rhinitis due to animal (cat) (dog) hair and dander: Secondary | ICD-10-CM | POA: Diagnosis not present

## 2022-04-22 DIAGNOSIS — Z124 Encounter for screening for malignant neoplasm of cervix: Secondary | ICD-10-CM | POA: Diagnosis not present

## 2022-04-22 DIAGNOSIS — Z1151 Encounter for screening for human papillomavirus (HPV): Secondary | ICD-10-CM | POA: Diagnosis not present

## 2022-04-22 DIAGNOSIS — Z6830 Body mass index (BMI) 30.0-30.9, adult: Secondary | ICD-10-CM | POA: Diagnosis not present

## 2022-04-22 DIAGNOSIS — Z01419 Encounter for gynecological examination (general) (routine) without abnormal findings: Secondary | ICD-10-CM | POA: Diagnosis not present

## 2022-04-30 DIAGNOSIS — J3089 Other allergic rhinitis: Secondary | ICD-10-CM | POA: Diagnosis not present

## 2022-04-30 DIAGNOSIS — J3081 Allergic rhinitis due to animal (cat) (dog) hair and dander: Secondary | ICD-10-CM | POA: Diagnosis not present

## 2022-04-30 DIAGNOSIS — J301 Allergic rhinitis due to pollen: Secondary | ICD-10-CM | POA: Diagnosis not present

## 2022-05-10 DIAGNOSIS — J3089 Other allergic rhinitis: Secondary | ICD-10-CM | POA: Diagnosis not present

## 2022-05-10 DIAGNOSIS — J301 Allergic rhinitis due to pollen: Secondary | ICD-10-CM | POA: Diagnosis not present

## 2022-05-10 DIAGNOSIS — J3081 Allergic rhinitis due to animal (cat) (dog) hair and dander: Secondary | ICD-10-CM | POA: Diagnosis not present

## 2022-05-16 DIAGNOSIS — J3081 Allergic rhinitis due to animal (cat) (dog) hair and dander: Secondary | ICD-10-CM | POA: Diagnosis not present

## 2022-05-16 DIAGNOSIS — J3089 Other allergic rhinitis: Secondary | ICD-10-CM | POA: Diagnosis not present

## 2022-05-16 DIAGNOSIS — J301 Allergic rhinitis due to pollen: Secondary | ICD-10-CM | POA: Diagnosis not present

## 2022-05-24 DIAGNOSIS — J3089 Other allergic rhinitis: Secondary | ICD-10-CM | POA: Diagnosis not present

## 2022-05-24 DIAGNOSIS — J3081 Allergic rhinitis due to animal (cat) (dog) hair and dander: Secondary | ICD-10-CM | POA: Diagnosis not present

## 2022-05-24 DIAGNOSIS — J301 Allergic rhinitis due to pollen: Secondary | ICD-10-CM | POA: Diagnosis not present

## 2022-05-28 ENCOUNTER — Encounter: Payer: Self-pay | Admitting: Internal Medicine

## 2022-05-28 ENCOUNTER — Ambulatory Visit: Payer: BC Managed Care – PPO | Admitting: Internal Medicine

## 2022-05-28 VITALS — BP 110/82 | HR 80 | Temp 98.7°F | Ht 63.0 in | Wt 169.0 lb

## 2022-05-28 DIAGNOSIS — J019 Acute sinusitis, unspecified: Secondary | ICD-10-CM | POA: Insufficient documentation

## 2022-05-28 DIAGNOSIS — Z6830 Body mass index (BMI) 30.0-30.9, adult: Secondary | ICD-10-CM

## 2022-05-28 DIAGNOSIS — J309 Allergic rhinitis, unspecified: Secondary | ICD-10-CM

## 2022-05-28 DIAGNOSIS — J4521 Mild intermittent asthma with (acute) exacerbation: Secondary | ICD-10-CM

## 2022-05-28 DIAGNOSIS — J0100 Acute maxillary sinusitis, unspecified: Secondary | ICD-10-CM | POA: Diagnosis not present

## 2022-05-28 DIAGNOSIS — F439 Reaction to severe stress, unspecified: Secondary | ICD-10-CM

## 2022-05-28 DIAGNOSIS — E6609 Other obesity due to excess calories: Secondary | ICD-10-CM | POA: Diagnosis not present

## 2022-05-28 MED ORDER — ALPRAZOLAM 0.5 MG PO TABS: ORAL_TABLET | ORAL | 2 refills | Status: DC

## 2022-05-28 MED ORDER — AZITHROMYCIN 250 MG PO TABS
ORAL_TABLET | ORAL | 0 refills | Status: DC
Start: 2022-05-28 — End: 2022-06-20

## 2022-05-28 MED ORDER — METHYLPREDNISOLONE ACETATE 80 MG/ML IJ SUSP
80.0000 mg | Freq: Once | INTRAMUSCULAR | Status: AC
Start: 2022-05-28 — End: 2022-05-28
  Administered 2022-05-28: 80 mg via INTRAMUSCULAR

## 2022-05-28 MED ORDER — VENLAFAXINE HCL ER 37.5 MG PO CP24: 37.5000 mg | ORAL_CAPSULE | Freq: Every day | ORAL | 3 refills | Status: DC

## 2022-05-28 NOTE — Assessment & Plan Note (Signed)
On Singulair, Xyzal

## 2022-05-28 NOTE — Assessment & Plan Note (Signed)
Doing better Cont on Effexor

## 2022-05-28 NOTE — Progress Notes (Signed)
Subjective:  Patient ID: Shannon [name], female    DOB: 02-07-1965  Age: 57 y.o. MRN: [id number]  CC: Follow-up (3 [facility] F/U, Sinus pressure, headache, mild fever at home, post nasal drip. Neg at home COVID test)   HPI Shannon [name] presents for stress C/o allergies and a new sinus infection  Outpatient Medications Prior to Visit  Medication Sig Dispense Refill   albuterol (PROAIR HFA) 108 (90 Base) MCG/ACT inhaler Inhale 2 puffs into the lungs every 4 (four) hours as needed for wheezing or shortness of breath. 1 Inhaler 5   azelastine (OPTIVAR) 0.05 % ophthalmic solution Place 1 drop into both eyes as needed.     Azelastine HCl 137 MCG/SPRAY SOLN Place 1 spray into the nose at bedtime.     Cyanocobalamin (VITAMIN B-12 PO) Take 1 tablet by mouth daily.     EPINEPHrine 0.3 mg/0.3 mL IJ SOAJ injection      fluconazole (DIFLUCAN) 150 MG tablet TAKE 1 TABLET BY MOUTH ONCE FOR 1 DAY     fluticasone (FLONASE) 50 MCG/ACT nasal spray Place 1 spray into both nostrils as needed.     hydrocortisone (ANUSOL-HC) 2.5 % rectal cream Place 1 Application rectally 2 (two) times daily. Apply per rectum BID for 7 days 30 g 0   levocetirizine (XYZAL) 5 MG tablet Take 1 tablet by mouth daily.     montelukast (SINGULAIR) 10 MG tablet TAKE 1 TABLET BY MOUTH EVERY DAY IN THE EVENING FOR 7 DAYS     triamcinolone cream (KENALOG) 0.1 % Apply 1 Application topically 2 (two) times daily.     ALPRAZolam (XANAX) 0.5 MG tablet TAKE 1 TABLET(0.5 MG) BY MOUTH AT BEDTIME AS NEEDED FOR SLEEP 30 tablet 0   venlafaxine XR (EFFEXOR XR) 37.5 MG 24 hr capsule Take 1 capsule (37.5 mg total) by mouth daily with breakfast. 30 capsule 3   dicyclomine (BENTYL) 10 MG capsule TAKE 1 CAPSULE(10 MG) BY MOUTH THREE TIMES DAILY AS NEEDED FOR SPASMS (Patient not taking: Reported on 01/31/2022) 270 capsule 1   azithromycin (ZITHROMAX) 250 MG tablet      tirzepatide (ZEPBOUND) 2.5 MG/0.5ML Pen Inject 2.5 mg into the skin once a week.  2 mL 3   No facility-administered medications prior to visit.    ROS: Review of Systems  Constitutional:  Positive for fatigue. Negative for activity change, appetite change, chills and unexpected weight change.  HENT:  Positive for sinus pain. Negative for congestion, mouth sores and sinus pressure.   Eyes:  Negative for visual disturbance.  Respiratory:  Negative for cough and chest tightness.   Gastrointestinal:  Negative for abdominal pain and nausea.  Genitourinary:  Negative for difficulty urinating, frequency and vaginal pain.  Musculoskeletal:  Negative for back pain and gait problem.  Skin:  Negative for pallor and rash.  Neurological:  Negative for dizziness, tremors, weakness, numbness and headaches.  Psychiatric/Behavioral:  Negative for confusion, sleep disturbance and suicidal ideas. The patient is nervous/anxious.     Objective:  BP 110/82 (BP Location: Right Arm, Patient Position: Sitting, Cuff Size: Normal)   Pulse 80   Temp 98.7 F (37.1 C) (Oral)   Ht 5\' 3"  (1.6 m)   Wt 169 lb (76.7 kg)   SpO2 97%   BMI 29.94 kg/m   BP Readings from Last 3 Encounters:  05/28/22 110/82  01/31/22 120/72  12/17/21 124/82    Wt Readings from Last 3 Encounters:  05/28/22 169 lb (76.7 kg)  01/31/22 173 lb (  78.5 kg)  12/17/21 172 lb (78 kg)    Physical Exam Constitutional:      General: She is not in acute distress.    Appearance: Normal appearance. She is well-developed.  HENT:     Head: Normocephalic.     Right Ear: External ear normal.     Left Ear: External ear normal.     Nose: Nose normal.  Eyes:     General:        Right eye: No discharge.        Left eye: No discharge.     Conjunctiva/sclera: Conjunctivae normal.     Pupils: Pupils are equal, round, and reactive to light.  Neck:     Thyroid: No thyromegaly.     Vascular: No JVD.     Trachea: No tracheal deviation.  Cardiovascular:     Rate and Rhythm: Normal rate and regular rhythm.     Heart sounds:  Normal heart sounds.  Pulmonary:     Effort: No respiratory distress.     Breath sounds: No stridor. No wheezing.  Abdominal:     General: Bowel sounds are normal. There is no distension.     Palpations: Abdomen is soft. There is no mass.     Tenderness: There is no abdominal tenderness. There is no guarding or rebound.  Musculoskeletal:        General: No tenderness.     Cervical back: Normal range of motion and neck supple. No rigidity.  Lymphadenopathy:     Cervical: No cervical adenopathy.  Skin:    Findings: No erythema or rash.  Neurological:     Cranial Nerves: No cranial nerve deficit.     Motor: No abnormal muscle tone.     Coordination: Coordination normal.     Deep Tendon Reflexes: Reflexes normal.  Psychiatric:        Behavior: Behavior normal.        Thought Content: Thought content normal.        Judgment: Judgment normal.     Lab Results  Component Value Date   WBC 7.7 01/31/2022   HGB 13.4 01/31/2022   HCT 39.2 01/31/2022   PLT 313.0 01/31/2022   GLUCOSE 90 01/31/2022   CHOL 269 (H) 01/31/2022   TRIG 121.0 01/31/2022   HDL 69.80 01/31/2022   LDLCALC 175 (H) 01/31/2022   ALT 20 01/31/2022   AST 14 01/31/2022   NA 140 01/31/2022   K 4.1 01/31/2022   CL 104 01/31/2022   CREATININE 0.69 01/31/2022   BUN 11 01/31/2022   CO2 28 01/31/2022   TSH 1.52 01/31/2022   INR 1.2 (H) 11/18/2012   HGBA1C 5.2 12/23/2012    CT CARDIAC SCORING (SELF PAY ONLY)  Addendum Date: 03/28/2020   ADDENDUM REPORT: 03/28/2020 08:49 CLINICAL DATA:  Risk stratification EXAM: Coronary Calcium Score TECHNIQUE: The patient was scanned on a [employer] scanner. Axial non-contrast 3 mm slices were carried out through the heart. The data set was analyzed on a dedicated work station and scored using the Agatson method. FINDINGS: Non-cardiac: See separate report from [facility]. Ascending Aorta: Normal caliber Pericardium: Normal Coronary arteries: Normal origins IMPRESSION:  Coronary calcium score of 0. This is a low risk study. Electronically Signed   By: Chrystie Nose M.D.   On: 03/28/2020 08:49   Result Date: 03/28/2020 EXAM: OVER-READ INTERPRETATION  CT CHEST The following report is an over-read performed by radiologist Dr. Charlett Nose of [facility] on 03/27/2020. This over-read  does not include interpretation of cardiac or coronary anatomy or pathology. The coronary calcium score interpretation by the cardiologist is attached. COMPARISON:  None. FINDINGS: Vascular: Heart is normal size.  Aorta normal caliber. Mediastinum/Nodes: No adenopathy Lungs/Pleura: No confluent opacities or effusions. Upper Abdomen: Imaging into the upper abdomen demonstrates no acute findings. Musculoskeletal: Chest wall soft tissues are unremarkable. No acute bony abnormality. IMPRESSION: No acute or significant extracardiac abnormality. Electronically Signed: By: Charlett Nose M.D. On: 03/27/2020 10:34    Assessment & Plan:   Problem List Items Addressed This Visit     Allergic rhinitis    On Singulair, Xyzal      Asthma    Doing well      Obesity    On diet Planning to start Zepbound      Stress at home - Primary    Doing better Cont on Effexor      Acute sinusitis    Start a Zpac Medrol pack      Relevant Medications   azithromycin (ZITHROMAX Z-PAK) 250 MG tablet      Meds ordered this encounter  Medications   ALPRAZolam (XANAX) 0.5 MG tablet    Sig: TAKE 1 TABLET(0.5 MG) BY MOUTH AT BEDTIME AS NEEDED FOR SLEEP    Dispense:  30 tablet    Refill:  2   venlafaxine XR (EFFEXOR XR) 37.5 MG 24 hr capsule    Sig: Take 1 capsule (37.5 mg total) by mouth daily with breakfast.    Dispense:  30 capsule    Refill:  3   azithromycin (ZITHROMAX Z-PAK) 250 MG tablet    Sig: As directed    Dispense:  6 tablet    Refill:  0   methylPREDNISolone acetate (DEPO-MEDROL) injection 80 mg      Follow-up: Return in about 3 months (around 08/27/2022) for a  follow-up visit.  Sonda Primes, MD

## 2022-05-28 NOTE — Assessment & Plan Note (Signed)
Start a Zpac Medrol pack

## 2022-05-28 NOTE — Patient Instructions (Signed)
For a mild COVID-19 case - take zinc 50 mg a day for 1 week, vitamin C 1000 mg daily for 1 week, vitamin D2 50,000 units weekly for 2 months (unless  taking vitamin D daily already), an antioxidant Quercetin 500 mg twice a day for 1 week (if you can get it quick enough). Take Allegra or Benadryl.  Maintain good oral hydration and take Tylenol for high fever.    

## 2022-05-28 NOTE — Assessment & Plan Note (Signed)
Doing well 

## 2022-05-28 NOTE — Assessment & Plan Note (Addendum)
On diet Planning to start Zepbound

## 2022-06-03 ENCOUNTER — Other Ambulatory Visit: Payer: Self-pay | Admitting: Internal Medicine

## 2022-06-03 DIAGNOSIS — J301 Allergic rhinitis due to pollen: Secondary | ICD-10-CM | POA: Diagnosis not present

## 2022-06-04 DIAGNOSIS — J3081 Allergic rhinitis due to animal (cat) (dog) hair and dander: Secondary | ICD-10-CM | POA: Diagnosis not present

## 2022-06-04 DIAGNOSIS — J301 Allergic rhinitis due to pollen: Secondary | ICD-10-CM | POA: Diagnosis not present

## 2022-06-04 DIAGNOSIS — J3089 Other allergic rhinitis: Secondary | ICD-10-CM | POA: Diagnosis not present

## 2022-06-13 DIAGNOSIS — J3081 Allergic rhinitis due to animal (cat) (dog) hair and dander: Secondary | ICD-10-CM | POA: Diagnosis not present

## 2022-06-13 DIAGNOSIS — J3089 Other allergic rhinitis: Secondary | ICD-10-CM | POA: Diagnosis not present

## 2022-06-13 DIAGNOSIS — J301 Allergic rhinitis due to pollen: Secondary | ICD-10-CM | POA: Diagnosis not present

## 2022-06-18 DIAGNOSIS — J3089 Other allergic rhinitis: Secondary | ICD-10-CM | POA: Diagnosis not present

## 2022-06-18 DIAGNOSIS — J301 Allergic rhinitis due to pollen: Secondary | ICD-10-CM | POA: Diagnosis not present

## 2022-06-18 DIAGNOSIS — J3081 Allergic rhinitis due to animal (cat) (dog) hair and dander: Secondary | ICD-10-CM | POA: Diagnosis not present

## 2022-06-20 ENCOUNTER — Encounter: Payer: Self-pay | Admitting: Internal Medicine

## 2022-06-20 ENCOUNTER — Ambulatory Visit (INDEPENDENT_AMBULATORY_CARE_PROVIDER_SITE_OTHER): Payer: BC Managed Care – PPO | Admitting: Internal Medicine

## 2022-06-20 VITALS — BP 120/78 | HR 80 | Ht 63.0 in | Wt 169.0 lb

## 2022-06-20 DIAGNOSIS — R197 Diarrhea, unspecified: Secondary | ICD-10-CM | POA: Diagnosis not present

## 2022-06-20 DIAGNOSIS — K589 Irritable bowel syndrome without diarrhea: Secondary | ICD-10-CM | POA: Diagnosis not present

## 2022-06-20 NOTE — Patient Instructions (Addendum)
Take Pepto Bismol and Imodium as needed while travelling   Start daily fiber supplement   Follow up in 6 months  _______________________________________________________  If your blood pressure at your visit was 140/90 or greater, please contact your primary care physician to follow up on this.  _______________________________________________________  If you are age 55 or older, your body mass index should be between 23-30. Your Body mass index is 29.94 kg/m. If this is out of the aforementioned range listed, please consider follow up with your Primary Care Provider.  If you are age 62 or younger, your body mass index should be between 19-25. Your Body mass index is 29.94 kg/m. If this is out of the aformentioned range listed, please consider follow up with your Primary Care Provider.   ________________________________________________________  The [facility] providers would like to encourage you to use [facility] to communicate with providers for non-urgent requests or questions.  Due to long hold times on the telephone, sending your provider a message by [facility] may be a faster and more efficient way to get a response.  Please allow 48 business hours for a response.  Please remember that this is for non-urgent requests.  _______________________________________________________   Thank you for entrusting me with your care and for choosing [facility], Dr. Eulah Pont

## 2022-06-20 NOTE — Progress Notes (Signed)
Chief Complaint: Diarrhea  HPI : 57 year old female with history of asthma and GERD presents for follow up of diarrhea.  She works as a Environmental consultant at [employer].  Interval History: Overall she feels like things are getting better. She is still having some gas and some diarrhea. On average she has 3-4 BMs per day. The amitriptyline medication made her sleepy so she stopped this and started the Effexor, which has really helped. She thinks that anxiety triggers her GI symptoms. She did not try the Bentyl medication that was prescribed previously and has not had the chance to try a daily fiber supplement yet. Her husband is 6 months out from his liver transplant at this point and is doing better. He had a bile duct complication in 02/2022, which has now resolved. She is going to [place], [place] next month for 1 month with a group of students.  Current Outpatient Medications  Medication Sig Dispense Refill   ALPRAZolam (XANAX) 0.5 MG tablet TAKE 1 TABLET(0.5 MG) BY MOUTH AT BEDTIME AS NEEDED FOR SLEEP 30 tablet 2   azelastine (OPTIVAR) 0.05 % ophthalmic solution Place 1 drop into both eyes as needed.     Azelastine HCl 137 MCG/SPRAY SOLN Place 1 spray into the nose at bedtime.     Cyanocobalamin (VITAMIN B-12 PO) Take 1 tablet by mouth daily.     dicyclomine (BENTYL) 10 MG capsule TAKE 1 CAPSULE(10 MG) BY MOUTH THREE TIMES DAILY AS NEEDED FOR SPASMS 270 capsule 1   EPINEPHrine 0.3 mg/0.3 mL IJ SOAJ injection      fluticasone (FLONASE) 50 MCG/ACT nasal spray Place 1 spray into both nostrils as needed.     hydrocortisone (ANUSOL-HC) 2.5 % rectal cream Place 1 Application rectally 2 (two) times daily. Apply per rectum BID for 7 days 30 g 0   levocetirizine (XYZAL) 5 MG tablet Take 1 tablet by mouth daily.     montelukast (SINGULAIR) 10 MG tablet TAKE 1 TABLET BY MOUTH EVERY DAY IN THE EVENING FOR 7 DAYS     venlafaxine XR (EFFEXOR-XR) 37.5 MG 24 hr capsule TAKE 1 CAPSULE(37.5 MG) BY MOUTH  DAILY WITH BREAKFAST 30 capsule 5   albuterol (PROAIR HFA) 108 (90 Base) MCG/ACT inhaler Inhale 2 puffs into the lungs every 4 (four) hours as needed for wheezing or shortness of breath. (Patient not taking: Reported on 06/20/2022) 1 Inhaler 5   No current facility-administered medications for this visit.   Review of Systems: All systems reviewed and negative except where noted in HPI.   Physical Exam: BP 120/78   Pulse 80   Ht 5\' 3"  (1.6 m)   Wt 169 lb (76.7 kg)   BMI 29.94 kg/m  Constitutional: Pleasant,well-developed, female in no acute distress. HEENT: Normocephalic and atraumatic. Conjunctivae are normal. No scleral icterus. Cardiovascular: Normal rate, regular rhythm.  Pulmonary/chest: Effort normal and breath sounds normal. No wheezing, rales or rhonchi. Abdominal: Soft, nondistended, nontender. Bowel sounds active throughout. There are no masses palpable. No hepatomegaly. Extremities: No edema Neurological: Alert and oriented to person place and time. Skin: Skin is warm and dry. No rashes noted. Psychiatric: Normal mood and affect. Behavior is normal.  Labs 01/2020: CBC nml. CMP nml. TSH nml.  EGD 02/27/09: Gastric fundic gland polyps  Colonoscopy 02/27/09: Rectal polyp (HP). Hemorrhoids.  EGD 10/04/14: Small hiatal hernia. No evidence of GERD. Fundic gland polyps, biopsy confirmed.  Colonoscopy 10/04/14: Normal colonoscopy  Colonoscopy 08/03/21: - The examined portion of the ileum was normal. -  One 6 mm polyp in the ascending colon, removed with a cold snare. Resected and retrieved. - One 3 mm polyp in the transverse colon, removed with a cold snare. Resected and retrieved. - Non-bleeding internal hemorrhoids. - Biopsies were taken with a cold forceps from the entire colon for evaluation of microscopic colitis. Path: 1. Surgical [P], colon nos, random sites MILD NONSPECIFIC INFLAMMATION CONSISTENT WITH PREP RELATED CHANGES. THERE ARE NO DIAGNOSTIC FEATURES OF INFLAMMATORY  BOWEL DISEASE, MICROSCOPIC COLITIS AND COLLAGENOUS COLITIS. 2. Surgical [P], colon, transverse and ascending, polyp (2) SESSILE SERRATED POLYP WITHOUT CYTOLOGIC DYSPLASIA AND A HYPERPLASTIC POLYP.  ASSESSMENT AND PLAN:  Diarrhea IBS GERD History of colon polyps Patient does occasionally still have diarrhea due to IBS, which is mainly stress related. She stopped her TCA medication due to somnolence and is now taking Effexor, which has worked well for her anxiety. She has an upcoming trip and will plan to take some antidiarrheal medications to take as needed. She has tried to follow the low FODMAP diet and plan to try a daily fiber supplement to see if this helps with her diarrhea further. - Previously gave hand out on low FODMAP diet - Okay to use Pepto Bismol and Imodium PRN while travelling - Okay to try a daily fiber supplement - Next colonoscopy due in 07/2026 - RTC 6 months  Eulah Pont, MD  I spent 25 minutes of time, including in depth chart review, independent review of results as outlined above, communicating results with the patient directly, face-to-face time with the patient, coordinating care, and ordering studies and medications as appropriate, and documentation.

## 2022-07-11 IMAGING — CT CT CARDIAC CORONARY ARTERY CALCIUM SCORE
3 series · 14 of 20 positions shown, 15 images · non-contrast
Comparison: None.
COMPARISON: None.

Addendum:
EXAM:
OVER-READ INTERPRETATION  CT CHEST

The following report is an over-read performed by radiologist Dr.
Lasvecars Modidima Namogang [REDACTED] on 03/27/2020. This over-read
does not include interpretation of cardiac or coronary anatomy or
pathology. The coronary calcium score interpretation by the
cardiologist is attached.
CLINICAL DATA: Risk stratification
Coronary Calcium Score
TECHNIQUE: The patient was scanned on a [employer] scanner. Axial
non-contrast 3 mm slices were carried out through the heart. The
data set was analyzed on a dedicated work station and scored using
the Agatson method.

[Series 2: casc 3.0 bv41 2 bestdiast 69 % · axial · 0.37mm/px · z∈[-213,-150]mm · 4 of 36 slices shown, 5 images]
[im 8/36  vessel]
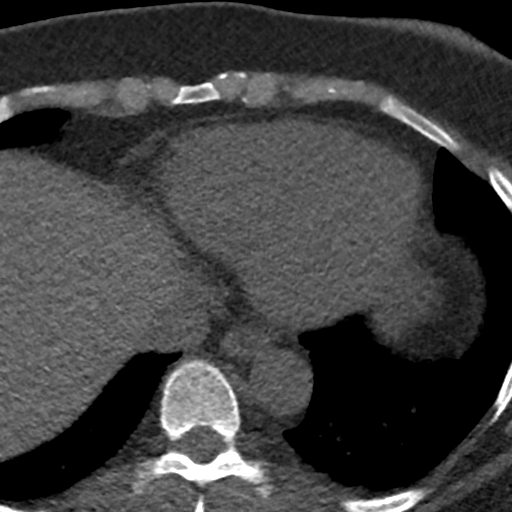
[im 8/36  lung]
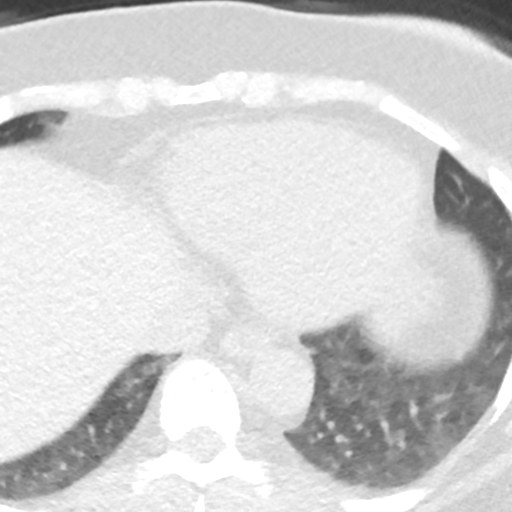
[im 15/36  vessel]
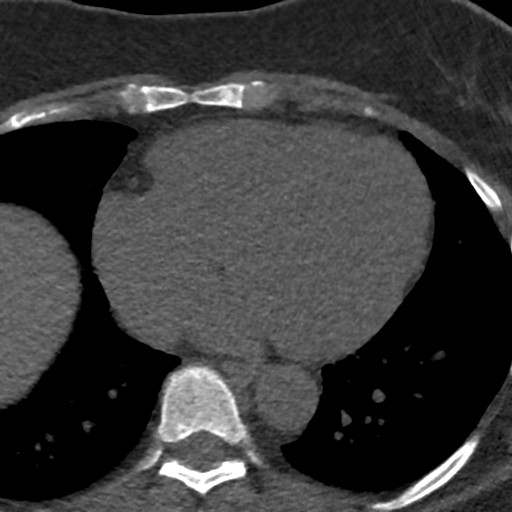
[im 22/36  vessel]
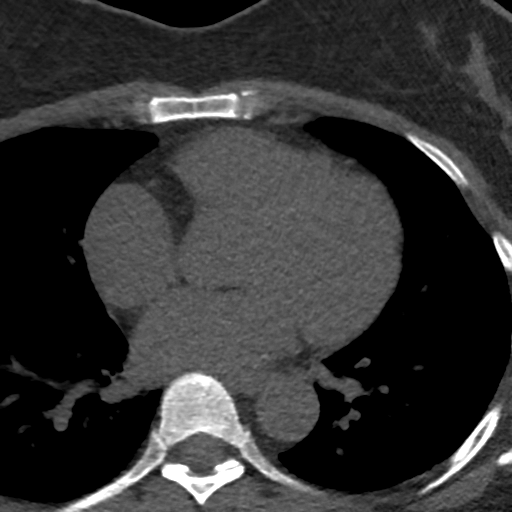
[im 29/36  vessel]
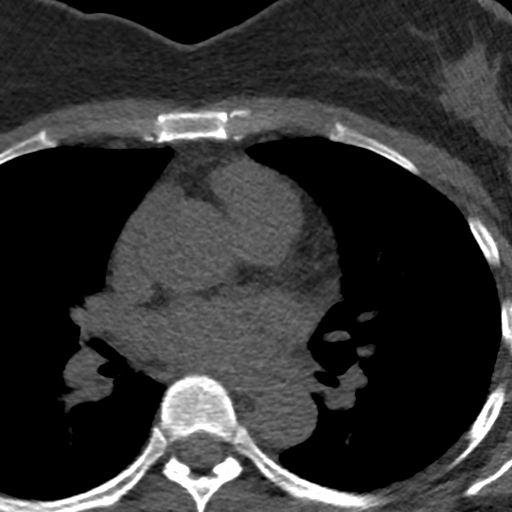

[Series 3: lung 69 % · axial · 0.63mm/px · z∈[-219,-147]mm · 5 of 37 slices shown]
[im 7/37  lung]
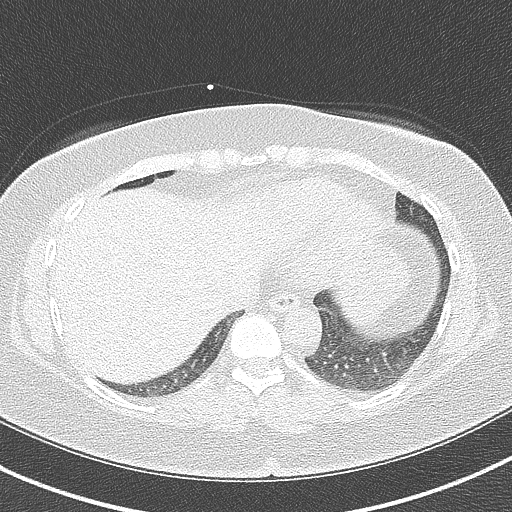
[im 13/37  lung]
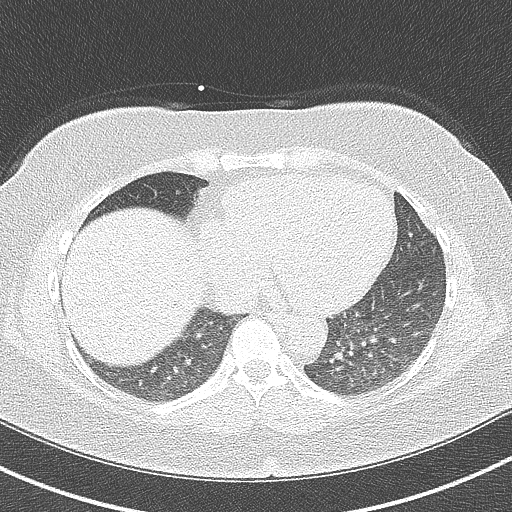
[im 19/37  lung]
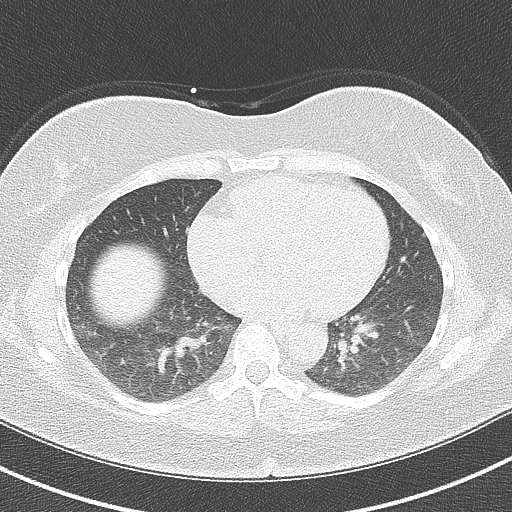
[im 25/37  lung]
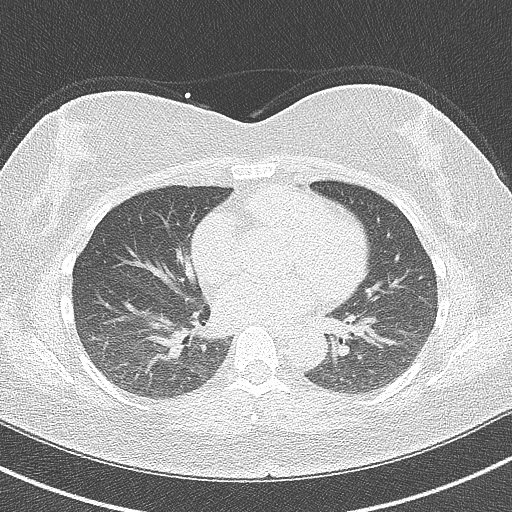
[im 31/37  lung]
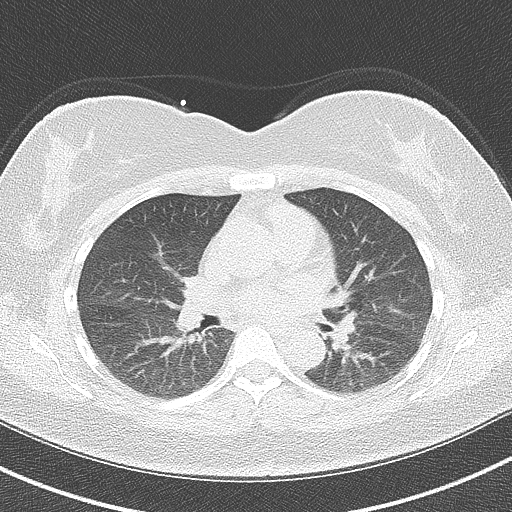

[Series 4: lung st 69 % · axial · 0.63mm/px · z∈[-219,-147]mm · 5 of 37 slices shown]
[im 7/37  lung]
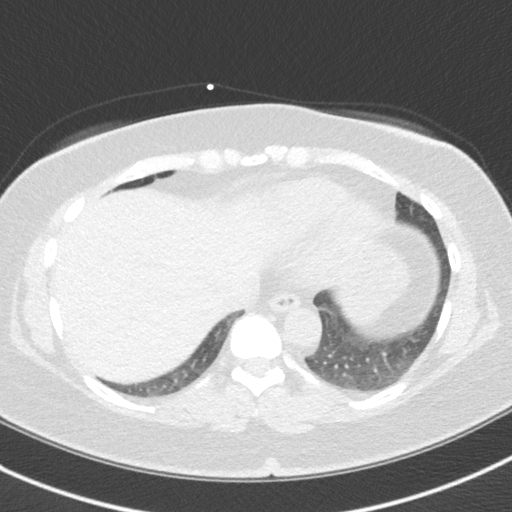
[im 13/37  lung]
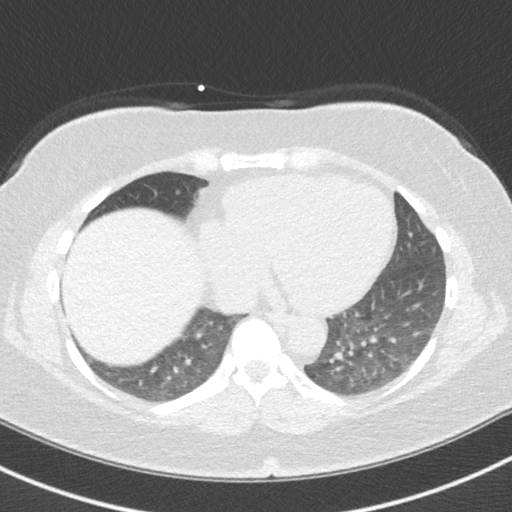
[im 19/37  lung]
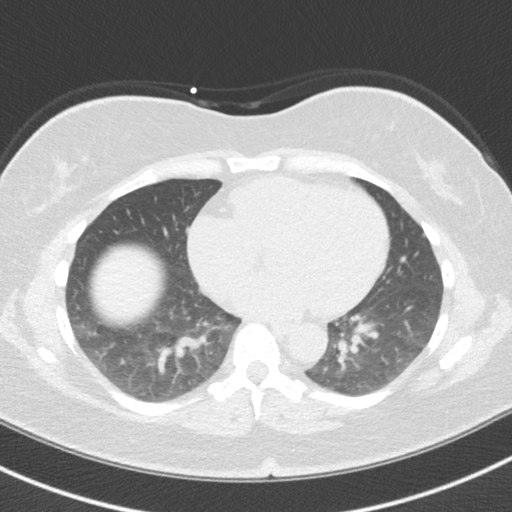
[im 25/37  lung]
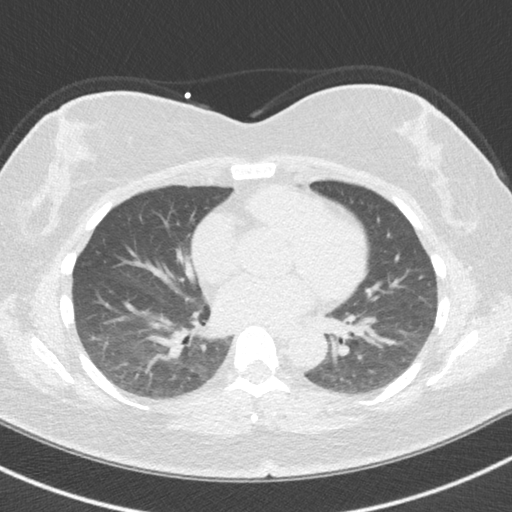
[im 31/37  lung]
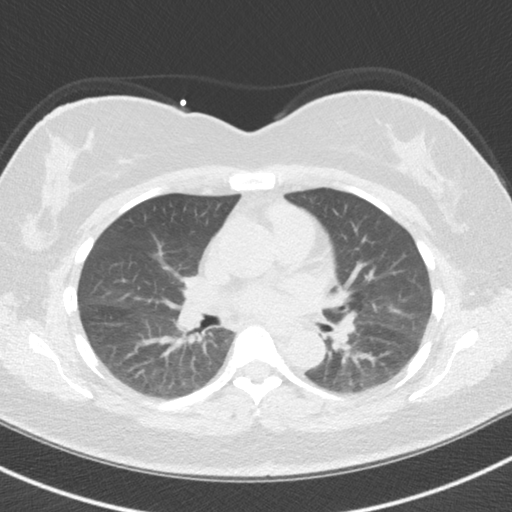

[14 of 20 positions shown; findings below may reference images not displayed]

FINDINGS: Vascular: Heart is normal size.  Aorta normal caliber.

Mediastinum/Nodes: No adenopathy

Lungs/Pleura: No confluent opacities or effusions.

Upper Abdomen: Imaging into the upper abdomen demonstrates no acute
findings.

Musculoskeletal: Chest wall soft tissues are unremarkable. No acute
bony abnormality.
IMPRESSION: No acute or significant extracardiac abnormality.
FINDINGS: Non-cardiac: See separate report from [REDACTED].

Ascending Aorta: Normal caliber

Pericardium: Normal

Coronary arteries: Normal origins
IMPRESSION: Coronary calcium score of 0. This is a low risk study.

*** End of Addendum ***
EXAM:
OVER-READ INTERPRETATION  CT CHEST

The following report is an over-read performed by radiologist Dr.
Lasvecars Modidima Namogang [REDACTED] on 03/27/2020. This over-read
does not include interpretation of cardiac or coronary anatomy or
pathology. The coronary calcium score interpretation by the
cardiologist is attached.
FINDINGS: Vascular: Heart is normal size.  Aorta normal caliber.

Mediastinum/Nodes: No adenopathy

Lungs/Pleura: No confluent opacities or effusions.

Upper Abdomen: Imaging into the upper abdomen demonstrates no acute
findings.

Musculoskeletal: Chest wall soft tissues are unremarkable. No acute
bony abnormality.
IMPRESSION: No acute or significant extracardiac abnormality.

## 2022-08-08 DIAGNOSIS — J301 Allergic rhinitis due to pollen: Secondary | ICD-10-CM | POA: Diagnosis not present

## 2022-08-08 DIAGNOSIS — J3089 Other allergic rhinitis: Secondary | ICD-10-CM | POA: Diagnosis not present

## 2022-08-08 DIAGNOSIS — J3081 Allergic rhinitis due to animal (cat) (dog) hair and dander: Secondary | ICD-10-CM | POA: Diagnosis not present

## 2022-08-15 DIAGNOSIS — J3089 Other allergic rhinitis: Secondary | ICD-10-CM | POA: Diagnosis not present

## 2022-08-15 DIAGNOSIS — J301 Allergic rhinitis due to pollen: Secondary | ICD-10-CM | POA: Diagnosis not present

## 2022-08-15 DIAGNOSIS — J3081 Allergic rhinitis due to animal (cat) (dog) hair and dander: Secondary | ICD-10-CM | POA: Diagnosis not present

## 2022-08-26 ENCOUNTER — Encounter: Payer: Self-pay | Admitting: Internal Medicine

## 2022-08-26 ENCOUNTER — Ambulatory Visit (INDEPENDENT_AMBULATORY_CARE_PROVIDER_SITE_OTHER): Payer: BC Managed Care – PPO | Admitting: Internal Medicine

## 2022-08-26 VITALS — BP 124/88 | HR 80 | Temp 99.1°F | Ht 63.0 in | Wt 171.2 lb

## 2022-08-26 DIAGNOSIS — E6609 Other obesity due to excess calories: Secondary | ICD-10-CM | POA: Diagnosis not present

## 2022-08-26 DIAGNOSIS — F331 Major depressive disorder, recurrent, moderate: Secondary | ICD-10-CM | POA: Diagnosis not present

## 2022-08-26 DIAGNOSIS — E538 Deficiency of other specified B group vitamins: Secondary | ICD-10-CM

## 2022-08-26 DIAGNOSIS — Z6830 Body mass index (BMI) 30.0-30.9, adult: Secondary | ICD-10-CM

## 2022-08-26 DIAGNOSIS — F439 Reaction to severe stress, unspecified: Secondary | ICD-10-CM

## 2022-08-26 DIAGNOSIS — Z23 Encounter for immunization: Secondary | ICD-10-CM

## 2022-08-26 DIAGNOSIS — J4521 Mild intermittent asthma with (acute) exacerbation: Secondary | ICD-10-CM

## 2022-08-26 MED ORDER — VENLAFAXINE HCL ER 37.5 MG PO CP24: 37.5000 mg | ORAL_CAPSULE | Freq: Every day | ORAL | 1 refills | Status: DC

## 2022-08-26 NOTE — Assessment & Plan Note (Signed)
Situational Start Effexor XR

## 2022-08-26 NOTE — Assessment & Plan Note (Addendum)
Doing well Seeing her allergist

## 2022-08-26 NOTE — Assessment & Plan Note (Signed)
On VB12

## 2022-08-26 NOTE — Addendum Note (Signed)
Addended by: Aundra Millet on: 08/26/2022 08:36 AM   Modules accepted: Orders

## 2022-08-26 NOTE — Progress Notes (Signed)
Subjective:  Patient ID: Shannon [name], female    DOB: 08-28-1965  Age: 57 y.o. MRN: [id number]  CC: No chief complaint on file.   HPI [name] presents for stress, anxiety [name] has to have his hernia fixed Not started Zepbound yet due to   Outpatient Medications Prior to Visit  Medication Sig Dispense Refill   ALPRAZolam (XANAX) 0.5 MG tablet TAKE 1 TABLET(0.5 MG) BY MOUTH AT BEDTIME AS NEEDED FOR SLEEP 30 tablet 2   azelastine (OPTIVAR) 0.05 % ophthalmic solution Place 1 drop into both eyes as needed.     Azelastine HCl 137 MCG/SPRAY SOLN Place 1 spray into the nose at bedtime.     Cyanocobalamin (VITAMIN B-12 PO) Take 1 tablet by mouth daily.     dicyclomine (BENTYL) 10 MG capsule TAKE 1 CAPSULE(10 MG) BY MOUTH THREE TIMES DAILY AS NEEDED FOR SPASMS 270 capsule 1   EPINEPHrine 0.3 mg/0.3 mL IJ SOAJ injection      fluticasone (FLONASE) 50 MCG/ACT nasal spray Place 1 spray into both nostrils as needed.     hydrocortisone (ANUSOL-HC) 2.5 % rectal cream Place 1 Application rectally 2 (two) times daily. Apply per rectum BID for 7 days 30 g 0   levocetirizine (XYZAL) 5 MG tablet Take 1 tablet by mouth daily.     montelukast (SINGULAIR) 10 MG tablet TAKE 1 TABLET BY MOUTH EVERY DAY IN THE EVENING FOR 7 DAYS     venlafaxine XR (EFFEXOR-XR) 37.5 MG 24 hr capsule TAKE 1 CAPSULE(37.5 MG) BY MOUTH DAILY WITH BREAKFAST 30 capsule 5   albuterol (PROAIR HFA) 108 (90 Base) MCG/ACT inhaler Inhale 2 puffs into the lungs every 4 (four) hours as needed for wheezing or shortness of breath. (Patient not taking: Reported on 06/20/2022) 1 Inhaler 5   No facility-administered medications prior to visit.    ROS: Review of Systems  Constitutional:  Negative for activity change, appetite change, chills, fatigue and unexpected weight change.  HENT:  Negative for congestion, mouth sores and sinus pressure.   Eyes:  Negative for visual disturbance.  Respiratory:  Negative for cough and chest  tightness.   Gastrointestinal:  Positive for diarrhea. Negative for abdominal pain and nausea.  Genitourinary:  Negative for difficulty urinating, frequency and vaginal pain.  Musculoskeletal:  Negative for back pain and gait problem.  Skin:  Negative for pallor and rash.  Neurological:  Negative for dizziness, tremors, weakness, numbness and headaches.  Psychiatric/Behavioral:  Negative for confusion and sleep disturbance. The patient is nervous/anxious.     Objective:  BP 124/88 (BP Location: Left Arm, Patient Position: Sitting, Cuff Size: Normal)   Pulse 80   Temp 99.1 F (37.3 C) (Oral)   Ht 5\' 3"  (1.6 m)   Wt 171 lb 3.2 oz (77.7 kg)   SpO2 97%   BMI 30.33 kg/m   BP Readings from Last 3 Encounters:  08/26/22 124/88  06/20/22 120/78  05/28/22 110/82    Wt Readings from Last 3 Encounters:  08/26/22 171 lb 3.2 oz (77.7 kg)  06/20/22 169 lb (76.7 kg)  05/28/22 169 lb (76.7 kg)    Physical Exam Constitutional:      General: She is not in acute distress.    Appearance: She is well-developed.  HENT:     Head: Normocephalic.     Right Ear: External ear normal.     Left Ear: External ear normal.     Nose: Nose normal.  Eyes:     General:  Right eye: No discharge.        Left eye: No discharge.     Conjunctiva/sclera: Conjunctivae normal.     Pupils: Pupils are equal, round, and reactive to light.  Neck:     Thyroid: No thyromegaly.     Vascular: No JVD.     Trachea: No tracheal deviation.  Cardiovascular:     Rate and Rhythm: Normal rate and regular rhythm.     Heart sounds: Normal heart sounds.  Pulmonary:     Effort: No respiratory distress.     Breath sounds: No stridor. No wheezing.  Abdominal:     General: Bowel sounds are normal. There is no distension.     Palpations: Abdomen is soft. There is no mass.     Tenderness: There is no abdominal tenderness. There is no guarding or rebound.  Musculoskeletal:        General: No tenderness.     Cervical  back: Normal range of motion and neck supple. No rigidity.  Lymphadenopathy:     Cervical: No cervical adenopathy.  Skin:    Findings: No erythema or rash.  Neurological:     Cranial Nerves: No cranial nerve deficit.     Motor: No abnormal muscle tone.     Coordination: Coordination normal.     Deep Tendon Reflexes: Reflexes normal.  Psychiatric:        Behavior: Behavior normal.        Thought Content: Thought content normal.        Judgment: Judgment normal.     Lab Results  Component Value Date   WBC 7.7 01/31/2022   HGB 13.4 01/31/2022   HCT 39.2 01/31/2022   PLT 313.0 01/31/2022   GLUCOSE 90 01/31/2022   CHOL 269 (H) 01/31/2022   TRIG 121.0 01/31/2022   HDL 69.80 01/31/2022   LDLCALC 175 (H) 01/31/2022   ALT 20 01/31/2022   AST 14 01/31/2022   NA 140 01/31/2022   K 4.1 01/31/2022   CL 104 01/31/2022   CREATININE 0.69 01/31/2022   BUN 11 01/31/2022   CO2 28 01/31/2022   TSH 1.52 01/31/2022   INR 1.2 (H) 11/18/2012   HGBA1C 5.2 12/23/2012    CT CARDIAC SCORING (SELF PAY ONLY)  Addendum Date: 03/28/2020   ADDENDUM REPORT: 03/28/2020 08:49 CLINICAL DATA:  Risk stratification EXAM: Coronary Calcium Score TECHNIQUE: The patient was scanned on a [employer] scanner. Axial non-contrast 3 mm slices were carried out through the heart. The data set was analyzed on a dedicated work station and scored using the Agatson method. FINDINGS: Non-cardiac: See separate report from [facility]. Ascending Aorta: Normal caliber Pericardium: Normal Coronary arteries: Normal origins IMPRESSION: Coronary calcium score of 0. This is a low risk study. Electronically Signed   By: Chrystie Nose M.D.   On: 03/28/2020 08:49   Result Date: 03/28/2020 EXAM: OVER-READ INTERPRETATION  CT CHEST The following report is an over-read performed by radiologist Dr. Charlett Nose of [facility] on 03/27/2020. This over-read does not include interpretation of cardiac or coronary anatomy or  pathology. The coronary calcium score interpretation by the cardiologist is attached. COMPARISON:  None. FINDINGS: Vascular: Heart is normal size.  Aorta normal caliber. Mediastinum/Nodes: No adenopathy Lungs/Pleura: No confluent opacities or effusions. Upper Abdomen: Imaging into the upper abdomen demonstrates no acute findings. Musculoskeletal: Chest wall soft tissues are unremarkable. No acute bony abnormality. IMPRESSION: No acute or significant extracardiac abnormality. Electronically Signed: By: Charlett Nose M.D. On: 03/27/2020 10:34  Assessment & Plan:   Problem List Items Addressed This Visit     RESOLVED: Depression    Situational Start Effexor XR      Relevant Medications   venlafaxine XR (EFFEXOR-XR) 37.5 MG 24 hr capsule   Asthma    Doing well Seeing her allergist      Obesity    On diet Planning to start Zepbound      RESOLVED: B12 deficiency - Primary    On VB12      Stress at home    The possibility of FMLA discussed          Meds ordered this encounter  Medications   venlafaxine XR (EFFEXOR-XR) 37.5 MG 24 hr capsule    Sig: Take 1 capsule (37.5 mg total) by mouth daily with breakfast.    Dispense:  90 capsule    Refill:  1      Follow-up: Return in about 3 months (around 11/26/2022) for a follow-up visit.  Sonda Primes, MD

## 2022-08-26 NOTE — Assessment & Plan Note (Signed)
On diet Planning to start Zepbound

## 2022-08-26 NOTE — Assessment & Plan Note (Signed)
The possibility of FMLA discussed

## 2022-08-27 ENCOUNTER — Other Ambulatory Visit: Payer: Self-pay | Admitting: Internal Medicine

## 2022-08-27 MED ORDER — ZEPBOUND 2.5 MG/0.5ML ~~LOC~~ SOAJ: 2.5000 mg | SUBCUTANEOUS | 3 refills | Status: DC

## 2022-08-28 ENCOUNTER — Telehealth: Payer: Self-pay

## 2022-08-28 ENCOUNTER — Other Ambulatory Visit (HOSPITAL_COMMUNITY): Payer: Self-pay

## 2022-08-28 NOTE — Telephone Encounter (Signed)
Pharmacy Patient Advocate Encounter   Received notification from CoverMyMeds that prior authorization for Zepbound is required/requested.   Insurance verification completed.   The patient is insured through [facility] .   Per test claim: PA required; PA submitted to [employer] via CoverMyMeds Key/confirmation #/EOC [id number] Status is pending

## 2022-08-29 ENCOUNTER — Other Ambulatory Visit (HOSPITAL_COMMUNITY): Payer: Self-pay

## 2022-08-29 DIAGNOSIS — J3081 Allergic rhinitis due to animal (cat) (dog) hair and dander: Secondary | ICD-10-CM | POA: Diagnosis not present

## 2022-08-29 DIAGNOSIS — J3089 Other allergic rhinitis: Secondary | ICD-10-CM | POA: Diagnosis not present

## 2022-08-29 DIAGNOSIS — J301 Allergic rhinitis due to pollen: Secondary | ICD-10-CM | POA: Diagnosis not present

## 2022-08-29 NOTE — Telephone Encounter (Signed)
Pharmacy Patient Advocate Encounter  [place]  that [place] for Zepbound has been APPROVED from 08/28/2022 to 02/26/2023. Ran test claim, Copay is $30.00  PA #/Case ID/Reference #: [id number]

## 2022-08-29 NOTE — Telephone Encounter (Signed)
Sent pt a mychart msg on approval../lmb

## 2022-09-02 ENCOUNTER — Ambulatory Visit: Payer: BC Managed Care – PPO | Admitting: Internal Medicine

## 2022-09-10 DIAGNOSIS — J3081 Allergic rhinitis due to animal (cat) (dog) hair and dander: Secondary | ICD-10-CM | POA: Diagnosis not present

## 2022-09-10 DIAGNOSIS — J3089 Other allergic rhinitis: Secondary | ICD-10-CM | POA: Diagnosis not present

## 2022-09-10 DIAGNOSIS — J301 Allergic rhinitis due to pollen: Secondary | ICD-10-CM | POA: Diagnosis not present

## 2022-09-17 DIAGNOSIS — J3089 Other allergic rhinitis: Secondary | ICD-10-CM | POA: Diagnosis not present

## 2022-09-17 DIAGNOSIS — J301 Allergic rhinitis due to pollen: Secondary | ICD-10-CM | POA: Diagnosis not present

## 2022-09-17 DIAGNOSIS — J3081 Allergic rhinitis due to animal (cat) (dog) hair and dander: Secondary | ICD-10-CM | POA: Diagnosis not present

## 2022-09-21 ENCOUNTER — Telehealth: Payer: Self-pay | Admitting: Physician Assistant

## 2022-09-21 MED ORDER — ONDANSETRON HCL 4 MG PO TABS: 4.0000 mg | ORAL_TABLET | Freq: Four times a day (QID) | ORAL | 0 refills | Status: DC | PRN

## 2022-09-21 MED ORDER — DICYCLOMINE HCL 10 MG PO CAPS: 10.0000 mg | ORAL_CAPSULE | Freq: Three times a day (TID) | ORAL | 0 refills | Status: DC

## 2022-09-21 NOTE — Telephone Encounter (Signed)
Telephone Call to on call service over the weekend  She has not started any new medications including Zepbound listed in her chart.  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  I will Send in a refill of her Dicyclomine 10mg  TID #60 for now and some Zofran 4mg  Q4-6 hours #30 to see if this will help her get through these acute symptoms.  Also discussed that I would stick to clear liquids for now. If things get worse recommend she proceed to the ER.  [name]- Can you please check in with patient on Monday or send this to Dr. Derek Mound nurse.  Thanks-JLL

## 2022-09-23 NOTE — Telephone Encounter (Signed)
Spoke with the patient. She took Imodium yesterday. No diarrhea "yet" today. She has abdominal cramping. She has taken dicyclomine today. Afebrile. Wants to be seen. Encourage fluids today. Appointment scheduled for tomorrow. Will cancel if she continues to improve.

## 2022-09-24 ENCOUNTER — Ambulatory Visit (INDEPENDENT_AMBULATORY_CARE_PROVIDER_SITE_OTHER): Payer: BC Managed Care – PPO | Admitting: Internal Medicine

## 2022-09-24 ENCOUNTER — Encounter: Payer: Self-pay | Admitting: Internal Medicine

## 2022-09-24 VITALS — BP 118/70 | HR 88 | Ht 62.25 in | Wt 169.0 lb

## 2022-09-24 DIAGNOSIS — J3081 Allergic rhinitis due to animal (cat) (dog) hair and dander: Secondary | ICD-10-CM | POA: Diagnosis not present

## 2022-09-24 DIAGNOSIS — J3089 Other allergic rhinitis: Secondary | ICD-10-CM | POA: Diagnosis not present

## 2022-09-24 DIAGNOSIS — K219 Gastro-esophageal reflux disease without esophagitis: Secondary | ICD-10-CM

## 2022-09-24 DIAGNOSIS — R109 Unspecified abdominal pain: Secondary | ICD-10-CM

## 2022-09-24 DIAGNOSIS — J301 Allergic rhinitis due to pollen: Secondary | ICD-10-CM | POA: Diagnosis not present

## 2022-09-24 DIAGNOSIS — R197 Diarrhea, unspecified: Secondary | ICD-10-CM | POA: Diagnosis not present

## 2022-09-24 DIAGNOSIS — K589 Irritable bowel syndrome without diarrhea: Secondary | ICD-10-CM

## 2022-09-24 NOTE — Patient Instructions (Addendum)
It is okay to use Pepto Bismol and Imodium as needed while travelling   Okay to try daily fiber supplements  Continue taking Dicyclomine as needed   Follow up in 5 months  If your blood pressure at your visit was 140/90 or greater, please contact your primary care physician to follow up on this.  _______________________________________________________  If you are age 55 or older, your body mass index should be between 23-30. Your Body mass index is 30.66 kg/m. If this is out of the aforementioned range listed, please consider follow up with your Primary Care Provider.  If you are age 80 or younger, your body mass index should be between 19-25. Your Body mass index is 30.66 kg/m. If this is out of the aformentioned range listed, please consider follow up with your Primary Care Provider.   ________________________________________________________  The [facility] providers would like to encourage you to use [facility] to communicate with providers for non-urgent requests or questions.  Due to long hold times on the telephone, sending your provider a message by [facility] may be a faster and more efficient way to get a response.  Please allow 48 business hours for a response.  Please remember that this is for non-urgent requests.  _______________________________________________________   Thank you for entrusting me with your care and for choosing [facility], Dr. Eulah Pont

## 2022-09-24 NOTE — Progress Notes (Signed)
Chief Complaint: Diarrhea  HPI : 57 year old female with history of asthma and GERD presents for follow up of diarrhea.  She works as a Environmental consultant at [employer].  Interval History: She was having really bad diarrhea over the weekend for about 3 days.  She at least had 10-20 bowel movements at that time.  She finally had a normal stool this morning. Endorses a low grade fever over the weekend as well.  Denies eating anything odd or any sick contacts that she knows of. Dicyclomine has helped with abdominal cramping and diarrhea. Endorses nausea. Denies vomiting. She is following a bland diet. She will occasionally see bright red blood in her stools and when she wipes. Acid reflux is flaring up slightly. Denies dysphagia. Having some cramping in the LUQ. She went to [place] for a month and barely had any GI symptoms when eating food in [place].  Classes are starting back up again and so she had to miss some of the orientation activities due to her GI illness.  Her first day of classes for the fall semester is today.   Current Outpatient Medications  Medication Sig Dispense Refill   albuterol (PROAIR HFA) 108 (90 Base) MCG/ACT inhaler Inhale 2 puffs into the lungs every 4 (four) hours as needed for wheezing or shortness of breath. 1 Inhaler 5   ALPRAZolam (XANAX) 0.5 MG tablet TAKE 1 TABLET(0.5 MG) BY MOUTH AT BEDTIME AS NEEDED FOR SLEEP 30 tablet 2   azelastine (OPTIVAR) 0.05 % ophthalmic solution Place 1 drop into both eyes as needed.     Azelastine HCl 137 MCG/SPRAY SOLN Place 1 spray into the nose at bedtime.     Cyanocobalamin (VITAMIN B-12 PO) Take 1 tablet by mouth daily.     dicyclomine (BENTYL) 10 MG capsule Take 1 capsule (10 mg total) by mouth 3 (three) times daily before meals. 60 capsule 0   fluticasone (FLONASE) 50 MCG/ACT nasal spray Place 1 spray into both nostrils as needed.     hydrocortisone (ANUSOL-HC) 2.5 % rectal cream Place 1 Application rectally 2 (two) times daily.  Apply per rectum BID for 7 days 30 g 0   levocetirizine (XYZAL) 5 MG tablet Take 1 tablet by mouth daily.     montelukast (SINGULAIR) 10 MG tablet TAKE 1 TABLET BY MOUTH EVERY DAY IN THE EVENING FOR 7 DAYS     ondansetron (ZOFRAN) 4 MG tablet Take 1 tablet (4 mg total) by mouth every 6 (six) hours as needed for nausea or vomiting (Use one tab every 4-6 hours as needed for Nausea). 30 tablet 0   venlafaxine XR (EFFEXOR-XR) 37.5 MG 24 hr capsule Take 1 capsule (37.5 mg total) by mouth daily with breakfast. 90 capsule 1   EPINEPHrine 0.3 mg/0.3 mL IJ SOAJ injection  (Patient not taking: Reported on 09/24/2022)     tirzepatide (ZEPBOUND) 2.5 MG/0.5ML Pen Inject 2.5 mg into the skin once a week. (Patient not taking: Reported on 09/24/2022) 2 mL 3   No current facility-administered medications for this visit.   Review of Systems: All systems reviewed and negative except where noted in HPI.   Physical Exam: BP 118/70 (BP Location: Left Arm, Patient Position: Sitting, Cuff Size: Normal)   Pulse 88   Ht 5' 2.25" (1.581 m) Comment: height measured without shoes  Wt 169 lb (76.7 kg)   BMI 30.66 kg/m  Constitutional: Pleasant,well-developed, female in no acute distress. HEENT: Normocephalic and atraumatic. Conjunctivae are normal. No scleral icterus. Cardiovascular:  Normal rate, regular rhythm.  Pulmonary/chest: Effort normal and breath sounds normal. No wheezing, rales or rhonchi. Abdominal: Soft, nondistended, nontender. Bowel sounds active throughout. There are no masses palpable. No hepatomegaly. Extremities: No edema Neurological: Alert and oriented to person place and time. Skin: Skin is warm and dry. No rashes noted. Psychiatric: Normal mood and affect. Behavior is normal.  Labs 01/2020: CBC nml. CMP nml. TSH nml.  EGD 02/27/09: Gastric fundic gland polyps  Colonoscopy 02/27/09: Rectal polyp (HP). Hemorrhoids.  EGD 10/04/14: Small hiatal hernia. No evidence of GERD. Fundic gland polyps,  biopsy confirmed.  Colonoscopy 10/04/14: Normal colonoscopy  Colonoscopy 08/03/21: - The examined portion of the ileum was normal. - One 6 mm polyp in the ascending colon, removed with a cold snare. Resected and retrieved. - One 3 mm polyp in the transverse colon, removed with a cold snare. Resected and retrieved. - Non-bleeding internal hemorrhoids. - Biopsies were taken with a cold forceps from the entire colon for evaluation of microscopic colitis. Path: 1. Surgical [P], colon nos, random sites MILD NONSPECIFIC INFLAMMATION CONSISTENT WITH PREP RELATED CHANGES. THERE ARE NO DIAGNOSTIC FEATURES OF INFLAMMATORY BOWEL DISEASE, MICROSCOPIC COLITIS AND COLLAGENOUS COLITIS. 2. Surgical [P], colon, transverse and ascending, polyp (2) SESSILE SERRATED POLYP WITHOUT CYTOLOGIC DYSPLASIA AND A HYPERPLASTIC POLYP.  ASSESSMENT AND PLAN:  Diarrhea IBS GERD History of colon polyps Patient had a recent flareup of her diarrhea, likely due to a gastroenteritis infection.  Patient's diarrhea has now improved.  She had a solid stool today.  Bentyl has helped significantly with her abdominal pain and diarrhea.  She was previously not able to tolerate amitriptyline due to somnolence.  She has still not been able to try daily fiber supplement yet but she plans to do so.  I will also give her some samples of IBgard to see if this helps with her symptoms.  She would like to do another follow-up next year to be able to get an EGD done - Previously gave hand out on low FODMAP diet - Okay to use Pepto Bismol and Imodium PRN while travelling - Okay to try a daily fiber supplement - Cont dicyclomine PRN  - Give some samples of IBGard - Next colonoscopy due in 07/2026 - RTC 5 months.  Can discuss an EGD at that time.  Eulah Pont, MD  I spent 30 minutes of time, including in depth chart review, independent review of results as outlined above, communicating results with the patient directly, face-to-face time with  the patient, coordinating care, and ordering studies and medications as appropriate, and documentation.

## 2022-10-01 DIAGNOSIS — J3081 Allergic rhinitis due to animal (cat) (dog) hair and dander: Secondary | ICD-10-CM | POA: Diagnosis not present

## 2022-10-01 DIAGNOSIS — J301 Allergic rhinitis due to pollen: Secondary | ICD-10-CM | POA: Diagnosis not present

## 2022-10-01 DIAGNOSIS — J3089 Other allergic rhinitis: Secondary | ICD-10-CM | POA: Diagnosis not present

## 2022-10-07 DIAGNOSIS — J4530 Mild persistent asthma, uncomplicated: Secondary | ICD-10-CM | POA: Diagnosis not present

## 2022-10-07 DIAGNOSIS — J3089 Other allergic rhinitis: Secondary | ICD-10-CM | POA: Diagnosis not present

## 2022-10-07 DIAGNOSIS — J301 Allergic rhinitis due to pollen: Secondary | ICD-10-CM | POA: Diagnosis not present

## 2022-10-07 DIAGNOSIS — H1045 Other chronic allergic conjunctivitis: Secondary | ICD-10-CM | POA: Diagnosis not present

## 2022-10-08 DIAGNOSIS — J3081 Allergic rhinitis due to animal (cat) (dog) hair and dander: Secondary | ICD-10-CM | POA: Diagnosis not present

## 2022-10-08 DIAGNOSIS — J3089 Other allergic rhinitis: Secondary | ICD-10-CM | POA: Diagnosis not present

## 2022-10-08 DIAGNOSIS — J301 Allergic rhinitis due to pollen: Secondary | ICD-10-CM | POA: Diagnosis not present

## 2022-10-12 ENCOUNTER — Other Ambulatory Visit: Payer: Self-pay | Admitting: Internal Medicine

## 2022-10-18 DIAGNOSIS — J3081 Allergic rhinitis due to animal (cat) (dog) hair and dander: Secondary | ICD-10-CM | POA: Diagnosis not present

## 2022-10-18 DIAGNOSIS — J301 Allergic rhinitis due to pollen: Secondary | ICD-10-CM | POA: Diagnosis not present

## 2022-10-18 DIAGNOSIS — J3089 Other allergic rhinitis: Secondary | ICD-10-CM | POA: Diagnosis not present

## 2022-10-24 DIAGNOSIS — J301 Allergic rhinitis due to pollen: Secondary | ICD-10-CM | POA: Diagnosis not present

## 2022-10-24 DIAGNOSIS — J3089 Other allergic rhinitis: Secondary | ICD-10-CM | POA: Diagnosis not present

## 2022-10-24 DIAGNOSIS — J3081 Allergic rhinitis due to animal (cat) (dog) hair and dander: Secondary | ICD-10-CM | POA: Diagnosis not present

## 2022-10-29 DIAGNOSIS — J3089 Other allergic rhinitis: Secondary | ICD-10-CM | POA: Diagnosis not present

## 2022-10-29 DIAGNOSIS — J301 Allergic rhinitis due to pollen: Secondary | ICD-10-CM | POA: Diagnosis not present

## 2022-10-29 DIAGNOSIS — J3081 Allergic rhinitis due to animal (cat) (dog) hair and dander: Secondary | ICD-10-CM | POA: Diagnosis not present

## 2022-11-05 DIAGNOSIS — J301 Allergic rhinitis due to pollen: Secondary | ICD-10-CM | POA: Diagnosis not present

## 2022-11-05 DIAGNOSIS — J3089 Other allergic rhinitis: Secondary | ICD-10-CM | POA: Diagnosis not present

## 2022-11-05 DIAGNOSIS — J3081 Allergic rhinitis due to animal (cat) (dog) hair and dander: Secondary | ICD-10-CM | POA: Diagnosis not present

## 2022-11-19 DIAGNOSIS — J3081 Allergic rhinitis due to animal (cat) (dog) hair and dander: Secondary | ICD-10-CM | POA: Diagnosis not present

## 2022-11-19 DIAGNOSIS — J301 Allergic rhinitis due to pollen: Secondary | ICD-10-CM | POA: Diagnosis not present

## 2022-11-19 DIAGNOSIS — J3089 Other allergic rhinitis: Secondary | ICD-10-CM | POA: Diagnosis not present

## 2022-11-26 DIAGNOSIS — J301 Allergic rhinitis due to pollen: Secondary | ICD-10-CM | POA: Diagnosis not present

## 2022-11-26 DIAGNOSIS — J3081 Allergic rhinitis due to animal (cat) (dog) hair and dander: Secondary | ICD-10-CM | POA: Diagnosis not present

## 2022-11-26 DIAGNOSIS — J3089 Other allergic rhinitis: Secondary | ICD-10-CM | POA: Diagnosis not present

## 2022-12-02 ENCOUNTER — Ambulatory Visit (INDEPENDENT_AMBULATORY_CARE_PROVIDER_SITE_OTHER): Payer: BC Managed Care – PPO | Admitting: Internal Medicine

## 2022-12-02 VITALS — BP 120/78 | HR 76 | Temp 98.6°F | Ht 62.25 in | Wt 159.0 lb

## 2022-12-02 DIAGNOSIS — E6609 Other obesity due to excess calories: Secondary | ICD-10-CM | POA: Diagnosis not present

## 2022-12-02 DIAGNOSIS — E538 Deficiency of other specified B group vitamins: Secondary | ICD-10-CM | POA: Diagnosis not present

## 2022-12-02 DIAGNOSIS — Z6830 Body mass index (BMI) 30.0-30.9, adult: Secondary | ICD-10-CM

## 2022-12-02 DIAGNOSIS — E66811 Obesity, class 1: Secondary | ICD-10-CM | POA: Diagnosis not present

## 2022-12-02 DIAGNOSIS — F331 Major depressive disorder, recurrent, moderate: Secondary | ICD-10-CM | POA: Diagnosis not present

## 2022-12-02 DIAGNOSIS — R5383 Other fatigue: Secondary | ICD-10-CM

## 2022-12-02 MED ORDER — TRIAMCINOLONE ACETONIDE 0.5 % EX CREA
1.0000 | TOPICAL_CREAM | Freq: Three times a day (TID) | CUTANEOUS | 1 refills | Status: DC
Start: 2022-12-02 — End: 2023-01-21

## 2022-12-02 MED ORDER — ZEPBOUND 2.5 MG/0.5ML ~~LOC~~ SOAJ: 2.5000 mg | SUBCUTANEOUS | 3 refills | Status: DC

## 2022-12-02 NOTE — Assessment & Plan Note (Signed)
Situational On Effexor XR

## 2022-12-02 NOTE — Assessment & Plan Note (Signed)
On diet Planning to start Zepbound

## 2022-12-02 NOTE — Assessment & Plan Note (Signed)
Better  

## 2022-12-02 NOTE — Assessment & Plan Note (Signed)
On B12 

## 2022-12-02 NOTE — Progress Notes (Signed)
Subjective:  Patient ID: Shannon [name], female    DOB: 05/10/1965  Age: 57 y.o. MRN: [id number]  CC: Medical Management of Chronic Issues (3 MNTH F/U)   HPI Shannon [name] presents for stress, obesity, anxiety  Outpatient Medications Prior to Visit  Medication Sig Dispense Refill   albuterol (PROAIR HFA) 108 (90 Base) MCG/ACT inhaler Inhale 2 puffs into the lungs every 4 (four) hours as needed for wheezing or shortness of breath. 1 Inhaler 5   ALPRAZolam (XANAX) 0.5 MG tablet TAKE 1 TABLET(0.5 MG) BY MOUTH AT BEDTIME AS NEEDED FOR SLEEP 30 tablet 2   azelastine (OPTIVAR) 0.05 % ophthalmic solution Place 1 drop into both eyes as needed.     Azelastine HCl 137 MCG/SPRAY SOLN Place 1 spray into the nose at bedtime.     Cyanocobalamin (VITAMIN B-12 PO) Take 1 tablet by mouth daily.     dicyclomine (BENTYL) 10 MG capsule Take 1 capsule (10 mg total) by mouth 3 (three) times daily before meals. 60 capsule 0   fluticasone (FLONASE) 50 MCG/ACT nasal spray Place 1 spray into both nostrils as needed.     hydrocortisone (ANUSOL-HC) 2.5 % rectal cream Place 1 Application rectally 2 (two) times daily. Apply per rectum BID for 7 days 30 g 0   levocetirizine (XYZAL) 5 MG tablet Take 1 tablet by mouth daily.     montelukast (SINGULAIR) 10 MG tablet TAKE 1 TABLET BY MOUTH EVERY DAY IN THE EVENING FOR 7 DAYS     ondansetron (ZOFRAN) 4 MG tablet Take 1 tablet (4 mg total) by mouth every 6 (six) hours as needed for nausea or vomiting (Use one tab every 4-6 hours as needed for Nausea). 30 tablet 0   venlafaxine XR (EFFEXOR-XR) 37.5 MG 24 hr capsule TAKE 1 CAPSULE(37.5 MG) BY MOUTH DAILY WITH BREAKFAST 30 capsule 5   EPINEPHrine 0.3 mg/0.3 mL IJ SOAJ injection  (Patient not taking: Reported on 09/24/2022)     tirzepatide (ZEPBOUND) 2.5 MG/0.5ML Pen Inject 2.5 mg into the skin once a week. (Patient not taking: Reported on 09/24/2022) 2 mL 3   No facility-administered medications prior to visit.     ROS: Review of Systems  Constitutional:  Negative for activity change, appetite change, chills, fatigue and unexpected weight change.  HENT:  Negative for congestion, mouth sores and sinus pressure.   Eyes:  Negative for visual disturbance.  Respiratory:  Negative for cough and chest tightness.   Gastrointestinal:  Negative for abdominal pain and nausea.  Genitourinary:  Negative for difficulty urinating, frequency and vaginal pain.  Musculoskeletal:  Negative for back pain and gait problem.  Skin:  Negative for pallor and rash.  Neurological:  Negative for dizziness, tremors, weakness, numbness and headaches.  Psychiatric/Behavioral:  Negative for confusion and sleep disturbance.     Objective:  BP 120/78 (BP Location: Left Arm, Patient Position: Sitting, Cuff Size: Normal)   Pulse 76   Temp 98.6 F (37 C) (Oral)   Ht 5' 2.25" (1.581 m)   Wt 159 lb (72.1 kg)   SpO2 99%   BMI 28.85 kg/m   BP Readings from Last 3 Encounters:  12/02/22 120/78  09/24/22 118/70  08/26/22 124/88    Wt Readings from Last 3 Encounters:  12/02/22 159 lb (72.1 kg)  09/24/22 169 lb (76.7 kg)  08/26/22 171 lb 3.2 oz (77.7 kg)    Physical Exam Constitutional:      General: She is not in acute distress.  Appearance: Normal appearance. She is well-developed.  HENT:     Head: Normocephalic.     Right Ear: External ear normal.     Left Ear: External ear normal.     Nose: Nose normal.  Eyes:     General:        Right eye: No discharge.        Left eye: No discharge.     Conjunctiva/sclera: Conjunctivae normal.     Pupils: Pupils are equal, round, and reactive to light.  Neck:     Thyroid: No thyromegaly.     Vascular: No JVD.     Trachea: No tracheal deviation.  Cardiovascular:     Rate and Rhythm: Normal rate and regular rhythm.     Heart sounds: Normal heart sounds.  Pulmonary:     Effort: No respiratory distress.     Breath sounds: No stridor. No wheezing.  Abdominal:      General: Bowel sounds are normal. There is no distension.     Palpations: Abdomen is soft. There is no mass.     Tenderness: There is no abdominal tenderness. There is no guarding or rebound.  Musculoskeletal:        General: No tenderness.     Cervical back: Normal range of motion and neck supple. No rigidity.  Lymphadenopathy:     Cervical: No cervical adenopathy.  Skin:    Findings: No erythema or rash.  Neurological:     Cranial Nerves: No cranial nerve deficit.     Motor: No abnormal muscle tone.     Coordination: Coordination normal.     Deep Tendon Reflexes: Reflexes normal.  Psychiatric:        Behavior: Behavior normal.        Thought Content: Thought content normal.        Judgment: Judgment normal.     Lab Results  Component Value Date   WBC 7.7 01/31/2022   HGB 13.4 01/31/2022   HCT 39.2 01/31/2022   PLT 313.0 01/31/2022   GLUCOSE 90 01/31/2022   CHOL 269 (H) 01/31/2022   TRIG 121.0 01/31/2022   HDL 69.80 01/31/2022   LDLCALC 175 (H) 01/31/2022   ALT 20 01/31/2022   AST 14 01/31/2022   NA 140 01/31/2022   K 4.1 01/31/2022   CL 104 01/31/2022   CREATININE 0.69 01/31/2022   BUN 11 01/31/2022   CO2 28 01/31/2022   TSH 1.52 01/31/2022   INR 1.2 (H) 11/18/2012   HGBA1C 5.2 12/23/2012    CT CARDIAC SCORING (SELF PAY ONLY)  Addendum Date: 03/28/2020   ADDENDUM REPORT: 03/28/2020 08:49 CLINICAL DATA:  Risk stratification EXAM: Coronary Calcium Score TECHNIQUE: The patient was scanned on a [employer] scanner. Axial non-contrast 3 mm slices were carried out through the heart. The data set was analyzed on a dedicated work station and scored using the Agatson method. FINDINGS: Non-cardiac: See separate report from [facility]. Ascending Aorta: Normal caliber Pericardium: Normal Coronary arteries: Normal origins IMPRESSION: Coronary calcium score of 0. This is a low risk study. Electronically Signed   By: Chrystie Nose M.D.   On: 03/28/2020 08:49   Result  Date: 03/28/2020 EXAM: OVER-READ INTERPRETATION  CT CHEST The following report is an over-read performed by radiologist Dr. Charlett Nose of [facility] on 03/27/2020. This over-read does not include interpretation of cardiac or coronary anatomy or pathology. The coronary calcium score interpretation by the cardiologist is attached. COMPARISON:  None. FINDINGS: Vascular: Heart is normal  size.  Aorta normal caliber. Mediastinum/Nodes: No adenopathy Lungs/Pleura: No confluent opacities or effusions. Upper Abdomen: Imaging into the upper abdomen demonstrates no acute findings. Musculoskeletal: Chest wall soft tissues are unremarkable. No acute bony abnormality. IMPRESSION: No acute or significant extracardiac abnormality. Electronically Signed: By: Charlett Nose M.D. On: 03/27/2020 10:34    Assessment & Plan:   Problem List Items Addressed This Visit     Depression    Situational On Effexor XR      Obesity    On diet Planning to start Zepbound      Relevant Medications   tirzepatide (ZEPBOUND) 2.5 MG/0.5ML Pen   Fatigue    Better      RESOLVED: B12 deficiency - Primary    On B12         Meds ordered this encounter  Medications   tirzepatide (ZEPBOUND) 2.5 MG/0.5ML Pen    Sig: Inject 2.5 mg into the skin once a week.    Dispense:  2 mL    Refill:  3    BMI>30   triamcinolone cream (KENALOG) 0.5 %    Sig: Apply 1 Application topically 3 (three) times daily.    Dispense:  45 g    Refill:  1      Follow-up: Return in about 3 months (around 03/04/2023) for a follow-up visit.  Sonda Primes, MD

## 2022-12-06 DIAGNOSIS — J3089 Other allergic rhinitis: Secondary | ICD-10-CM | POA: Diagnosis not present

## 2022-12-06 DIAGNOSIS — J3081 Allergic rhinitis due to animal (cat) (dog) hair and dander: Secondary | ICD-10-CM | POA: Diagnosis not present

## 2022-12-06 DIAGNOSIS — J301 Allergic rhinitis due to pollen: Secondary | ICD-10-CM | POA: Diagnosis not present

## 2022-12-11 ENCOUNTER — Encounter: Payer: Self-pay | Admitting: Internal Medicine

## 2022-12-17 DIAGNOSIS — J3081 Allergic rhinitis due to animal (cat) (dog) hair and dander: Secondary | ICD-10-CM | POA: Diagnosis not present

## 2022-12-17 DIAGNOSIS — J3089 Other allergic rhinitis: Secondary | ICD-10-CM | POA: Diagnosis not present

## 2022-12-17 DIAGNOSIS — J301 Allergic rhinitis due to pollen: Secondary | ICD-10-CM | POA: Diagnosis not present

## 2022-12-20 ENCOUNTER — Other Ambulatory Visit: Payer: Self-pay | Admitting: Internal Medicine

## 2022-12-24 DIAGNOSIS — J3089 Other allergic rhinitis: Secondary | ICD-10-CM | POA: Diagnosis not present

## 2022-12-24 DIAGNOSIS — J301 Allergic rhinitis due to pollen: Secondary | ICD-10-CM | POA: Diagnosis not present

## 2022-12-24 DIAGNOSIS — J3081 Allergic rhinitis due to animal (cat) (dog) hair and dander: Secondary | ICD-10-CM | POA: Diagnosis not present

## 2022-12-30 ENCOUNTER — Other Ambulatory Visit: Payer: Self-pay | Admitting: Internal Medicine

## 2022-12-30 MED ORDER — ZEPBOUND 2.5 MG/0.5ML ~~LOC~~ SOAJ: 2.5000 mg | SUBCUTANEOUS | 3 refills | Status: DC

## 2023-01-09 ENCOUNTER — Other Ambulatory Visit: Payer: Self-pay | Admitting: Internal Medicine

## 2023-01-09 DIAGNOSIS — J301 Allergic rhinitis due to pollen: Secondary | ICD-10-CM | POA: Diagnosis not present

## 2023-01-09 DIAGNOSIS — J3089 Other allergic rhinitis: Secondary | ICD-10-CM | POA: Diagnosis not present

## 2023-01-09 DIAGNOSIS — J3081 Allergic rhinitis due to animal (cat) (dog) hair and dander: Secondary | ICD-10-CM | POA: Diagnosis not present

## 2023-01-17 ENCOUNTER — Telehealth: Payer: BC Managed Care – PPO | Admitting: Physician Assistant

## 2023-01-17 ENCOUNTER — Telehealth: Payer: BC Managed Care – PPO | Admitting: Family Medicine

## 2023-01-17 DIAGNOSIS — U071 COVID-19: Secondary | ICD-10-CM

## 2023-01-17 NOTE — Progress Notes (Signed)
   Thank you for the details you included in the comment boxes. Those details are very helpful in determining the best course of treatment for you and help [place] to provide the best care.Because Ms. Shannon [name], we recommend that you schedule a Virtual Urgent Care video visit in order for the provider to better assess what is going on.  The provider will be able to give you a more accurate diagnosis and treatment plan if we can more freely discuss your symptoms and with the addition of a virtual examination.   If you change your visit to a video visit, we will bill your insurance (similar to an office visit) and you will not be charged for this e-Visit. You will be able to stay at home and speak with the first available [facility] advanced practice provider. The link to do a video visit is in the drop down Menu tab of your Welcome screen in MyChart.

## 2023-01-18 ENCOUNTER — Telehealth: Payer: BC Managed Care – PPO | Admitting: Physician Assistant

## 2023-01-18 ENCOUNTER — Encounter: Payer: Self-pay | Admitting: Physician Assistant

## 2023-01-18 DIAGNOSIS — J069 Acute upper respiratory infection, unspecified: Secondary | ICD-10-CM

## 2023-01-18 DIAGNOSIS — U071 COVID-19: Secondary | ICD-10-CM

## 2023-01-18 MED ORDER — BENZONATATE 100 MG PO CAPS
100.0000 mg | ORAL_CAPSULE | Freq: Two times a day (BID) | ORAL | 0 refills | Status: DC | PRN
Start: 2023-01-18 — End: 2023-11-13

## 2023-01-18 MED ORDER — GUAIFENESIN-DM 100-10 MG/5ML PO SYRP: 5.0000 mL | ORAL_SOLUTION | ORAL | 0 refills | Status: DC | PRN

## 2023-01-18 NOTE — Progress Notes (Signed)
Virtual Visit Consent   Shannon [name], you are scheduled for a virtual visit with a [facility] provider today. Just as with appointments in the office, your consent must be obtained to participate. Your consent will be active for this visit and any virtual visit you may have with one of our providers in the next 365 days. If you have a MyChart account, a copy of this consent can be sent to you electronically.  As this is a virtual visit, video technology does not allow for your provider to perform a traditional examination. This may limit your provider's ability to fully assess your condition. If your provider identifies any concerns that need to be evaluated in person or the need to arrange testing (such as labs, EKG, etc.), we will make arrangements to do so. Although advances in technology are sophisticated, we cannot ensure that it will always work on either your end or our end. If the connection with a video visit is poor, the visit may have to be switched to a telephone visit. With either a video or telephone visit, we are not always able to ensure that we have a secure connection.  By engaging in this virtual visit, you consent to the provision of healthcare and authorize for your insurance to be billed (if applicable) for the services provided during this visit. Depending on your insurance coverage, you may receive a charge related to this service.  I need to obtain your verbal consent now. Are you willing to proceed with your visit today? Shannon [name] has provided verbal consent on 01/18/2023 for a virtual visit (video or telephone). Laure Kidney, [place]  Date: 01/18/2023 8:46 AM  Virtual Visit via Video Note   I, Laure Kidney, connected with  Shannon [name]  ([id number], 1965-12-16) on 01/18/23 at  8:30 AM EST by a video-enabled telemedicine application and verified that I am speaking with the correct person using two identifiers.  Location: Patient: Virtual Visit  Location Patient: Home Provider: Virtual Visit Location Provider: Home Office   I discussed the limitations of evaluation and management by telemedicine and the availability of in person appointments. The patient expressed understanding and agreed to proceed.    History of Present Illness: Shannon [name] is a 57 y.o. who identifies as a female who was assigned female at birth, and is being seen today for COVID.Marland Kitchen  HPI: URI  This is a new problem. The current episode started yesterday. The problem has been unchanged. The maximum temperature recorded prior to her arrival was 100.4 - 100.9 F. Associated symptoms include coughing and a sore throat. She has tried acetaminophen for the symptoms. The treatment provided moderate relief.    Problems:  Patient Active Problem List   Diagnosis Date Noted   Acute sinusitis 05/28/2022   Stress at home 02/10/2022   Stress incontinence of urine 01/31/2022   Fatigue 02/25/2018   Insomnia 02/25/2018   Well adult exam 10/05/2010   Obesity 10/05/2010   Asthma 01/12/2008   Depression 11/17/2007   Allergic rhinitis 11/17/2007   GERD 11/17/2007    Allergies:  Allergies  Allergen Reactions   Augmentin [Amoxicillin-Pot Clavulanate] Diarrhea   Moxifloxacin     REACTION: palpitations   Medications:  Current Outpatient Medications:    albuterol (PROAIR HFA) 108 (90 Base) MCG/ACT inhaler, Inhale 2 puffs into the lungs every 4 (four) hours as needed for wheezing or shortness of breath., Disp: 1 Inhaler, Rfl: 5   ALPRAZolam (XANAX) 0.5 MG tablet, TAKE 1  TABLET(0.5 MG) BY MOUTH AT BEDTIME AS NEEDED FOR SLEEP, Disp: 30 tablet, Rfl: 3   azelastine (OPTIVAR) 0.05 % ophthalmic solution, Place 1 drop into both eyes as needed., Disp: , Rfl:    Azelastine HCl 137 MCG/SPRAY SOLN, Place 1 spray into the nose at bedtime., Disp: , Rfl:    Cyanocobalamin (VITAMIN B-12 PO), Take 1 tablet by mouth daily., Disp: , Rfl:    dicyclomine (BENTYL) 10 MG capsule, Take 1  capsule (10 mg total) by mouth 3 (three) times daily before meals., Disp: 60 capsule, Rfl: 0   EPINEPHrine 0.3 mg/0.3 mL IJ SOAJ injection, , Disp: , Rfl:    fluticasone (FLONASE) 50 MCG/ACT nasal spray, Place 1 spray into both nostrils as needed., Disp: , Rfl:    hydrocortisone (ANUSOL-HC) 2.5 % rectal cream, Place 1 Application rectally 2 (two) times daily. Apply per rectum BID for 7 days, Disp: 30 g, Rfl: 0   levocetirizine (XYZAL) 5 MG tablet, Take 1 tablet by mouth daily., Disp: , Rfl:    montelukast (SINGULAIR) 10 MG tablet, TAKE 1 TABLET BY MOUTH EVERY DAY IN THE EVENING FOR 7 DAYS, Disp: , Rfl:    ondansetron (ZOFRAN) 4 MG tablet, Take 1 tablet (4 mg total) by mouth every 6 (six) hours as needed for nausea or vomiting (Use one tab every 4-6 hours as needed for Nausea)., Disp: 30 tablet, Rfl: 0   tirzepatide (ZEPBOUND) 2.5 MG/0.5ML Pen, Inject 2.5 mg into the skin once a week., Disp: 2 mL, Rfl: 3   triamcinolone cream (KENALOG) 0.5 %, Apply 1 Application topically 3 (three) times daily., Disp: 45 g, Rfl: 1   venlafaxine XR (EFFEXOR-XR) 37.5 MG 24 hr capsule, TAKE 1 CAPSULE(37.5 MG) BY MOUTH DAILY WITH BREAKFAST, Disp: 30 capsule, Rfl: 5  Observations/Objective: Patient is well-developed, well-nourished in no acute distress.  Resting comfortably  at home.  Head is normocephalic, atraumatic.  No labored breathing.  Speech is clear and coherent with logical content.  Patient is alert and oriented at baseline.    Assessment and Plan: 1. COVID (Primary)  2. Viral URI with cough  Patient presenting with +COVID home test and fever. Doubt PNA, sepsis, other serious bacterial infection or acute emergent condition. Is otherwise well-appearing with a reassuring physical exam, and is safe to discharge home f Provided  strict return precautions and instructions on self-isolation/quarantine and anticipatory guidance.  Follow Up Instructions: I discussed the assessment and treatment plan with the  patient. The patient was provided an opportunity to ask questions and all were answered. The patient agreed with the plan and demonstrated an understanding of the instructions.  A copy of instructions were sent to the patient via MyChart unless otherwise noted below.    The patient was advised to call back or seek an in-person evaluation if the symptoms worsen or if the condition fails to improve as anticipated.    Laure Kidney, PA-C

## 2023-01-18 NOTE — Patient Instructions (Signed)
Tamera Stands, thank you for joining Laure Kidney, PA-C for today's virtual visit.  While this provider is not your primary care provider (PCP), if your PCP is located in our provider database this encounter information will be shared with them immediately following your visit.   A [facility] MyChart account gives you access to today's visit and all your visits, tests, and labs performed at [facility] " click here if you don't have a [facility] MyChart account or go to [email or web address]  Consent: (Patient) Shannon [name] provided verbal consent for this virtual visit at the beginning of the encounter.  Current Medications:  Current Outpatient Medications:    albuterol (PROAIR HFA) 108 (90 Base) MCG/ACT inhaler, Inhale 2 puffs into the lungs every 4 (four) hours as needed for wheezing or shortness of breath., Disp: 1 Inhaler, Rfl: 5   ALPRAZolam (XANAX) 0.5 MG tablet, TAKE 1 TABLET(0.5 MG) BY MOUTH AT BEDTIME AS NEEDED FOR SLEEP, Disp: 30 tablet, Rfl: 3   azelastine (OPTIVAR) 0.05 % ophthalmic solution, Place 1 drop into both eyes as needed., Disp: , Rfl:    Azelastine HCl 137 MCG/SPRAY SOLN, Place 1 spray into the nose at bedtime., Disp: , Rfl:    Cyanocobalamin (VITAMIN B-12 PO), Take 1 tablet by mouth daily., Disp: , Rfl:    dicyclomine (BENTYL) 10 MG capsule, Take 1 capsule (10 mg total) by mouth 3 (three) times daily before meals., Disp: 60 capsule, Rfl: 0   EPINEPHrine 0.3 mg/0.3 mL IJ SOAJ injection, , Disp: , Rfl:    fluticasone (FLONASE) 50 MCG/ACT nasal spray, Place 1 spray into both nostrils as needed., Disp: , Rfl:    hydrocortisone (ANUSOL-HC) 2.5 % rectal cream, Place 1 Application rectally 2 (two) times daily. Apply per rectum BID for 7 days, Disp: 30 g, Rfl: 0   levocetirizine (XYZAL) 5 MG tablet, Take 1 tablet by mouth daily., Disp: , Rfl:    montelukast (SINGULAIR) 10 MG tablet, TAKE 1 TABLET BY MOUTH EVERY DAY IN THE EVENING FOR 7 DAYS, Disp:  , Rfl:    ondansetron (ZOFRAN) 4 MG tablet, Take 1 tablet (4 mg total) by mouth every 6 (six) hours as needed for nausea or vomiting (Use one tab every 4-6 hours as needed for Nausea)., Disp: 30 tablet, Rfl: 0   tirzepatide (ZEPBOUND) 2.5 MG/0.5ML Pen, Inject 2.5 mg into the skin once a week., Disp: 2 mL, Rfl: 3   triamcinolone cream (KENALOG) 0.5 %, Apply 1 Application topically 3 (three) times daily., Disp: 45 g, Rfl: 1   venlafaxine XR (EFFEXOR-XR) 37.5 MG 24 hr capsule, TAKE 1 CAPSULE(37.5 MG) BY MOUTH DAILY WITH BREAKFAST, Disp: 30 capsule, Rfl: 5   Medications ordered in this encounter:  No orders of the defined types were placed in this encounter.    *If you need refills on other medications prior to your next appointment, please contact your pharmacy*  Follow-Up: Call back or seek an in-person evaluation if the symptoms worsen or if the condition fails to improve as anticipated.  [facility] [phone]  Other Instructions Follow up with PCP in 24-48 Hours    If you have been instructed to have an in-person evaluation today at a local Urgent Care facility, please use the link below. It will take you to a list of all of our available [facility], including address, phone number and hours of operation. Please do not delay care.  [facility]  If you or  a family member do not have a primary care provider, use the link below to schedule a visit and establish care. When you choose a [facility] primary care physician or advanced practice provider, you gain a long-term partner in health. Find a Primary Care Provider  Learn more about [facility]'s in-office and virtual care options:  - Get Care Now

## 2023-01-18 NOTE — Progress Notes (Signed)
Because of your symptoms, I feel your condition warrants further evaluation and I recommend that you be seen in a face to face visit.   NOTE: There will be NO CHARGE for this eVisit   If you are having a true medical emergency please call 911.      For an urgent face to face visit, [facility] has eight urgent care centers for your convenience:   NEW!! [facility] Health Urgent Care Center at [facility] Get Driving Directions [phone] [address], [address] [place], [address]    [facility] Health Urgent Care Center at [facility] Get Driving Directions [phone] [address] [place], [place] [address]   [facility] Health Urgent Care Center [facility]) Get Driving Directions [phone] [address] [place], [place] [address]  [facility] Health Urgent Care Center [facility]) Get Driving Directions [phone] [address] [place],  [place]  [address]  [facility] Health Urgent Care Center [facility] - at [employer]  [phone] [address] [place],  [place] [address]   [facility] Health Urgent Care at [facility] Get Driving Directions [phone] [address], [address] [place], [place] [address]   [facility] Health Urgent Care at [facility] Get Driving Directions  [phone] [address] [place], [place] [address]   [facility] Health Urgent Care at [facility] Directions [phone] [address], [address] [place], [place] [address]  Your MyChart E-visit questionnaire answers were reviewed by a board certified advanced clinical practitioner to complete your personal care plan based on your specific symptoms.  Thank you for using e-Visits.

## 2023-01-21 ENCOUNTER — Ambulatory Visit (HOSPITAL_COMMUNITY)
Admission: RE | Admit: 2023-01-21 | Discharge: 2023-01-21 | Disposition: A | Discharge status: Home / Self Care | Payer: BC Managed Care – PPO | Source: Ambulatory Visit | Attending: Nurse Practitioner | Admitting: Nurse Practitioner

## 2023-01-21 ENCOUNTER — Encounter (HOSPITAL_COMMUNITY): Payer: Self-pay

## 2023-01-21 ENCOUNTER — Ambulatory Visit (INDEPENDENT_AMBULATORY_CARE_PROVIDER_SITE_OTHER): Payer: BC Managed Care – PPO

## 2023-01-21 ENCOUNTER — Encounter: Payer: Self-pay | Admitting: Internal Medicine

## 2023-01-21 VITALS — BP 130/74 | HR 101 | Temp 100.6°F | Resp 16 | Ht 63.0 in | Wt 157.0 lb

## 2023-01-21 DIAGNOSIS — R051 Acute cough: Secondary | ICD-10-CM

## 2023-01-21 DIAGNOSIS — R918 Other nonspecific abnormal finding of lung field: Secondary | ICD-10-CM | POA: Diagnosis not present

## 2023-01-21 DIAGNOSIS — R059 Cough, unspecified: Secondary | ICD-10-CM | POA: Diagnosis not present

## 2023-01-21 DIAGNOSIS — J189 Pneumonia, unspecified organism: Secondary | ICD-10-CM | POA: Diagnosis not present

## 2023-01-21 MED ORDER — ACETAMINOPHEN 325 MG PO TABS
ORAL_TABLET | ORAL | Status: AC
  Filled 2023-01-21: qty 2

## 2023-01-21 MED ORDER — AZITHROMYCIN 250 MG PO TABS: ORAL_TABLET | ORAL | 0 refills | Status: DC

## 2023-01-21 MED ORDER — PROMETHAZINE-DM 6.25-15 MG/5ML PO SYRP: 5.0000 mL | ORAL_SOLUTION | Freq: Four times a day (QID) | ORAL | 0 refills | Status: DC | PRN

## 2023-01-21 MED ORDER — CEFUROXIME AXETIL 500 MG PO TABS: 500.0000 mg | ORAL_TABLET | Freq: Two times a day (BID) | ORAL | 0 refills | Status: AC

## 2023-01-21 MED ORDER — ACETAMINOPHEN 325 MG PO TABS
650.0000 mg | ORAL_TABLET | Freq: Once | ORAL | Status: AC
  Administered 2023-01-21: 650 mg via ORAL

## 2023-01-21 NOTE — Discharge Instructions (Signed)
Take the antibiotics as prescribed to treat for pneumonia.  Continue Tessalon Perles as needed as well as a cough medicine of sent to the pharmacy.  Continue Tylenol/Motrin as needed for fever.  Seek care if symptoms do not improve with treatment.

## 2023-01-21 NOTE — ED Triage Notes (Signed)
Patient here today with c/o cough and fever after testing positive for Covid 6 days ago. Patient states that her PCP suggested that she get an x-ray to see if she has pneumonia.

## 2023-01-21 NOTE — ED Provider Notes (Signed)
[facility]    CSN: [id number] Arrival date & time: 01/21/23  1008      History   Chief Complaint Chief Complaint  Patient presents with   Cough    HPI Shannon [name] is a 57 y.o. female.   Patient presents today with 6-day history of fevers, body aches, congested cough, shortness of breath and chest soreness, sore throat, headache, left ear feeling stopped up, diarrhea, decreased appetite, and fatigue.  No chills, runny or stuffy nose, abdominal pain, nausea/vomiting.  No drainage from the left ear.  Has been taking over-the-counter medications and Tessalon Perles for symptoms without much improvement.  Reports over the past couple of days, she has developed increased temperatures and is concerned for developing pneumonia.  Patient reports history of allergies and asthma, reports asthma is very mild.    Past Medical History:  Diagnosis Date   Allergy    Arthritis    Asthma    Depression    GERD (gastroesophageal reflux disease)     Patient Active Problem List   Diagnosis Date Noted   Acute sinusitis 05/28/2022   Stress at home 02/10/2022   Stress incontinence of urine 01/31/2022   Fatigue 02/25/2018   Insomnia 02/25/2018   Well adult exam 10/05/2010   Obesity 10/05/2010   Asthma 01/12/2008   Depression 11/17/2007   Allergic rhinitis 11/17/2007   GERD 11/17/2007    Past Surgical History:  Procedure Laterality Date   COLONOSCOPY     extraction of wisdom teeth     per pt removed x2 - only had 2 per pt.    OB History   No obstetric history on file.      Home Medications    Prior to Admission medications   Medication Sig Start Date End Date Taking? Authorizing Provider  azithromycin (ZITHROMAX) 250 MG tablet Take (2) tablets by mouth on day 1, then take (1) tablet by mouth on days 2-5. 01/21/23  Yes Cathlean Marseilles A, NP  cefUROXime (CEFTIN) 500 MG tablet Take 1 tablet (500 mg total) by mouth 2 (two) times daily with a meal for 5 days.  01/21/23 01/26/23 Yes Valentino Nose, NP  promethazine-dextromethorphan (PROMETHAZINE-DM) 6.25-15 MG/5ML syrup Take 5 mLs by mouth 4 (four) times daily as needed for cough. Do not take with alcohol or while driving or operating heavy machinery.  May cause drowsiness. 01/21/23  Yes Cathlean Marseilles A, NP  azelastine (OPTIVAR) 0.05 % ophthalmic solution Place 1 drop into both eyes as needed.    [provider]  benzonatate (TESSALON) 100 MG capsule Take 1 capsule (100 mg total) by mouth 2 (two) times daily as needed for cough. 01/18/23   Laure Kidney, PA-C  Cyanocobalamin (VITAMIN B-12 PO) Take 1 tablet by mouth daily.    [provider]  dicyclomine (BENTYL) 10 MG capsule Take 1 capsule (10 mg total) by mouth 3 (three) times daily before meals. 09/21/22   Unk Lightning, PA  EPINEPHrine 0.3 mg/0.3 mL IJ SOAJ injection  01/14/20   [provider]  guaiFENesin-dextromethorphan (ROBITUSSIN DM) 100-10 MG/5ML syrup Take 5 mLs by mouth every 4 (four) hours as needed for cough. 01/18/23   Laure Kidney, PA-C  levocetirizine (XYZAL) 5 MG tablet Take 1 tablet by mouth daily.    [provider]  montelukast (SINGULAIR) 10 MG tablet TAKE 1 TABLET BY MOUTH EVERY DAY IN THE EVENING FOR 7 DAYS    [provider]  tirzepatide (ZEPBOUND) 2.5 MG/0.5ML Pen Inject 2.5  mg into the skin once a week. 12/30/22   Plotnikov, Georgina Quint, MD  venlafaxine XR (EFFEXOR-XR) 37.5 MG 24 hr capsule TAKE 1 CAPSULE(37.5 MG) BY MOUTH DAILY WITH BREAKFAST 10/18/22   Plotnikov, Georgina Quint, MD    Family History Family History  Problem Relation Age of Onset   Hyperlipidemia Mother    Dementia Mother    Other Mother        had covid   Healthy Father        died from old age   Colon cancer Maternal Grandfather 33   Cancer - Lung Paternal Grandmother    Depression Daughter    Esophageal cancer Neg Hx    Rectal cancer Neg Hx    Stomach cancer Neg Hx     Social  History Social History   Tobacco Use   Smoking status: Never   Smokeless tobacco: Never  Vaping Use   Vaping status: Never Used  Substance Use Topics   Alcohol use: Yes    Comment: Occasionally drink wine (2x per week)   Drug use: No     Allergies   Augmentin [amoxicillin-pot clavulanate] and Moxifloxacin   Review of Systems Review of Systems Per HPI  Physical Exam Triage Vital Signs ED Triage Vitals  Encounter Vitals Group     BP 01/21/23 1021 130/74     Systolic BP Percentile --      Diastolic BP Percentile --      Pulse Rate 01/21/23 1021 (!) 101     Resp 01/21/23 1021 16     Temp 01/21/23 1021 (!) 100.6 F (38.1 C)     Temp Source 01/21/23 1021 Oral     SpO2 01/21/23 1021 96 %     Weight 01/21/23 1022 157 lb (71.2 kg)     Height 01/21/23 1022 5\' 3"  (1.6 m)     Head Circumference --      Peak Flow --      Pain Score 01/21/23 1022 6     Pain Loc --      Pain Education --      Exclude from Growth Chart --    No data found.  Updated Vital Signs BP 130/74 (BP Location: Right Arm)   Pulse (!) 101   Temp (!) 100.6 F (38.1 C) (Oral)   Resp 16   Ht 5\' 3"  (1.6 m)   Wt 157 lb (71.2 kg)   SpO2 96%   BMI 27.81 kg/m   Visual Acuity Right Eye Distance:   Left Eye Distance:   Bilateral Distance:    Right Eye Near:   Left Eye Near:    Bilateral Near:     Physical Exam Vitals and nursing note reviewed.  Constitutional:      General: She is not in acute distress.    Appearance: Normal appearance. She is not ill-appearing or toxic-appearing.  HENT:     Head: Normocephalic and atraumatic.     Right Ear: Tympanic membrane, ear canal and external ear normal. There is no impacted cerumen.     Left Ear: Tympanic membrane, ear canal and external ear normal. There is no impacted cerumen.     Nose: No congestion or rhinorrhea.     Mouth/Throat:     Mouth: Mucous membranes are moist.     Pharynx: Oropharynx is clear. No oropharyngeal exudate or posterior  oropharyngeal erythema.  Eyes:     General: No scleral icterus.    Extraocular Movements: Extraocular movements intact.  Cardiovascular:  Rate and Rhythm: Normal rate and regular rhythm.  Pulmonary:     Effort: Pulmonary effort is normal. No respiratory distress.     Breath sounds: Rhonchi present. No wheezing or rales.  Musculoskeletal:     Cervical back: Normal range of motion and neck supple.  Lymphadenopathy:     Cervical: No cervical adenopathy.  Skin:    General: Skin is warm and dry.     Coloration: Skin is not jaundiced or pale.     Findings: No erythema or rash.  Neurological:     Mental Status: She is alert and oriented to person, place, and time.  Psychiatric:        Behavior: Behavior is cooperative.      UC Treatments / Results  Labs (all labs ordered are listed, but only abnormal results are displayed) Labs Reviewed - No data to display  EKG   Radiology DG Chest 2 View Result Date: 01/21/2023 CLINICAL DATA:  57 year old female with cough. EXAM: CHEST - 2 VIEW COMPARISON:  Cardiac CT 03/27/2020 and earlier. FINDINGS: Lung volumes and mediastinal contours remain normal. Visualized tracheal air column is within normal limits. No pneumothorax, pulmonary edema, pleural effusion or consolidation. However, there is asymmetric mild peribronchial opacity in the right lower lung on the PA view, not correlated on the lateral. Elsewhere lung markings appear stable since 2022 and earlier. No acute osseous abnormality identified. Negative visible bowel gas. IMPRESSION: Difficult to exclude mild/early bronchopneumonia in the right lower lung. No consolidation or pleural effusion. Electronically Signed   By: Odessa Fleming M.D.   On: 01/21/2023 11:06    Procedures Procedures (including critical care time)  Medications Ordered in UC Medications  acetaminophen (TYLENOL) tablet 650 mg (650 mg Oral Given 01/21/23 1112)    Initial Impression / Assessment and Plan / UC Course  I  have reviewed the triage vital signs and the nursing notes.  Pertinent labs & imaging results that were available during my care of the patient were reviewed by me and considered in my medical decision making (see chart for details).   In triage, patient is slightly febrile and tachycardic, otherwise vital signs are stable.  SpO2 is 96% on room air.  1. Pneumonia of right lower lobe due to infectious organism Will treat for pneumonia with Ceftin twice daily for 5 days, azithromycin Other supportive care discussed Recommended follow-up with PCP to ensure full resolution Return and ER precautions also discussed  The patient was given the opportunity to ask questions.  All questions answered to their satisfaction.  The patient is in agreement to this plan.   Final Clinical Impressions(s) / UC Diagnoses   Final diagnoses:  Pneumonia of right lower lobe due to infectious organism     Discharge Instructions      Take the antibiotics as prescribed to treat for pneumonia.  Continue Tessalon Perles as needed as well as a cough medicine of sent to the pharmacy.  Continue Tylenol/Motrin as needed for fever.  Seek care if symptoms do not improve with treatment.     ED Prescriptions     Medication Sig Dispense Auth. Provider   azithromycin (ZITHROMAX) 250 MG tablet Take (2) tablets by mouth on day 1, then take (1) tablet by mouth on days 2-5. 6 tablet Cathlean Marseilles A, NP   cefUROXime (CEFTIN) 500 MG tablet Take 1 tablet (500 mg total) by mouth 2 (two) times daily with a meal for 5 days. 10 tablet Valentino Nose, NP   promethazine-dextromethorphan (  PROMETHAZINE-DM) 6.25-15 MG/5ML syrup Take 5 mLs by mouth 4 (four) times daily as needed for cough. Do not take with alcohol or while driving or operating heavy machinery.  May cause drowsiness. 118 mL Valentino Nose, NP      PDMP not reviewed this encounter.   Valentino Nose, NP 01/21/23 1249

## 2023-01-21 NOTE — Telephone Encounter (Signed)
I was able to speak with pt and I was a;le to inform her that her PCP is out for the holiday. However, we do have some providers on site and they are all booked for today. I did offer to sched pt with another office as they had openings or she can go to UC for her sxs as this may be an on set of COVID pneumonia. Pt has agreed to go to UC this morning as she only wanted to see her PCP.

## 2023-02-06 DIAGNOSIS — F4321 Adjustment disorder with depressed mood: Secondary | ICD-10-CM | POA: Diagnosis not present

## 2023-02-06 DIAGNOSIS — F419 Anxiety disorder, unspecified: Secondary | ICD-10-CM | POA: Diagnosis not present

## 2023-02-06 DIAGNOSIS — Z6379 Other stressful life events affecting family and household: Secondary | ICD-10-CM | POA: Diagnosis not present

## 2023-02-11 DIAGNOSIS — J3089 Other allergic rhinitis: Secondary | ICD-10-CM | POA: Diagnosis not present

## 2023-02-11 DIAGNOSIS — J3081 Allergic rhinitis due to animal (cat) (dog) hair and dander: Secondary | ICD-10-CM | POA: Diagnosis not present

## 2023-02-11 DIAGNOSIS — J301 Allergic rhinitis due to pollen: Secondary | ICD-10-CM | POA: Diagnosis not present

## 2023-02-20 ENCOUNTER — Encounter: Payer: Self-pay | Admitting: Internal Medicine

## 2023-02-25 DIAGNOSIS — J301 Allergic rhinitis due to pollen: Secondary | ICD-10-CM | POA: Diagnosis not present

## 2023-02-25 DIAGNOSIS — J3081 Allergic rhinitis due to animal (cat) (dog) hair and dander: Secondary | ICD-10-CM | POA: Diagnosis not present

## 2023-02-25 DIAGNOSIS — J3089 Other allergic rhinitis: Secondary | ICD-10-CM | POA: Diagnosis not present

## 2023-03-04 ENCOUNTER — Ambulatory Visit: Payer: BC Managed Care – PPO | Admitting: Internal Medicine

## 2023-03-04 ENCOUNTER — Encounter: Payer: Self-pay | Admitting: Internal Medicine

## 2023-03-04 ENCOUNTER — Ambulatory Visit (INDEPENDENT_AMBULATORY_CARE_PROVIDER_SITE_OTHER): Payer: BC Managed Care – PPO

## 2023-03-04 VITALS — BP 120/78 | HR 77 | Temp 98.8°F | Ht 63.0 in | Wt 158.0 lb

## 2023-03-04 DIAGNOSIS — Z8616 Personal history of COVID-19: Secondary | ICD-10-CM | POA: Diagnosis not present

## 2023-03-04 DIAGNOSIS — E66811 Obesity, class 1: Secondary | ICD-10-CM

## 2023-03-04 DIAGNOSIS — J4521 Mild intermittent asthma with (acute) exacerbation: Secondary | ICD-10-CM

## 2023-03-04 DIAGNOSIS — J189 Pneumonia, unspecified organism: Secondary | ICD-10-CM

## 2023-03-04 DIAGNOSIS — U071 COVID-19: Secondary | ICD-10-CM | POA: Insufficient documentation

## 2023-03-04 DIAGNOSIS — Z6830 Body mass index (BMI) 30.0-30.9, adult: Secondary | ICD-10-CM

## 2023-03-04 DIAGNOSIS — E6609 Other obesity due to excess calories: Secondary | ICD-10-CM

## 2023-03-04 MED ORDER — METHYLPREDNISOLONE ACETATE 40 MG/ML IJ SUSP
40.0000 mg | Freq: Once | INTRAMUSCULAR | Status: AC
  Administered 2023-03-04: 40 mg via INTRAMUSCULAR

## 2023-03-04 MED ORDER — PROMETHAZINE-DM 6.25-15 MG/5ML PO SYRP: 5.0000 mL | ORAL_SOLUTION | Freq: Four times a day (QID) | ORAL | 0 refills | Status: AC | PRN

## 2023-03-04 MED ORDER — TIRZEPATIDE-WEIGHT MANAGEMENT 5 MG/0.5ML ~~LOC~~ SOLN: 5.0000 mg | SUBCUTANEOUS | 3 refills | Status: DC

## 2023-03-04 MED ORDER — AZELASTINE-FLUTICASONE 137-50 MCG/ACT NA SUSP: 1.0000 | NASAL | 5 refills | Status: AC

## 2023-03-04 NOTE — Addendum Note (Signed)
Addended by: Delsa Grana R on: 03/04/2023 04:30 PM   Modules accepted: Orders

## 2023-03-04 NOTE — Assessment & Plan Note (Signed)
Worse Try Dymista; on Singulair Medrol pack

## 2023-03-04 NOTE — Progress Notes (Signed)
 Subjective:  Patient ID: Shannon [name], female    DOB: [birth year]  Age: 58 y.o. MRN: [id number]  CC: Medical Management of Chronic Issues (3 mnth f/u)   HPI [name] presents for COVID and pneumonia RLL - Dec 24/2024  Outpatient Medications Prior to Visit  Medication Sig Dispense Refill   azelastine  (OPTIVAR ) 0.05 % ophthalmic solution Place 1 drop into both eyes as needed.     Cyanocobalamin  (VITAMIN B-12 PO) Take 1 tablet by mouth daily.     dicyclomine  (BENTYL ) 10 MG capsule Take 1 capsule (10 mg total) by mouth 3 (three) times daily before meals. 60 capsule 0   EPINEPHrine 0.3 mg/0.3 mL IJ SOAJ injection      levocetirizine (XYZAL) 5 MG tablet Take 1 tablet by mouth daily.     montelukast  (SINGULAIR ) 10 MG tablet TAKE 1 TABLET BY MOUTH EVERY DAY IN THE EVENING FOR 7 DAYS     venlafaxine  XR (EFFEXOR -XR) 37.5 MG 24 hr capsule TAKE 1 CAPSULE(37.5 MG) BY MOUTH DAILY WITH BREAKFAST 30 capsule 5   guaiFENesin -dextromethorphan (ROBITUSSIN DM) 100-10 MG/5ML syrup Take 5 mLs by mouth every 4 (four) hours as needed for cough. 118 mL 0   promethazine -dextromethorphan (PROMETHAZINE -DM) 6.25-15 MG/5ML syrup Take 5 mLs by mouth 4 (four) times daily as needed for cough. Do not take with alcohol or while driving or operating heavy machinery.  May cause drowsiness. 118 mL 0   tirzepatide  (ZEPBOUND ) 2.5 MG/0.5ML Pen Inject 2.5 mg into the skin once a week. 2 mL 3   azithromycin  (ZITHROMAX ) 250 MG tablet Take (2) tablets by mouth on day 1, then take (1) tablet by mouth on days 2-5. (Patient not taking: Reported on 03/04/2023) 6 tablet 0   benzonatate  (TESSALON ) 100 MG capsule Take 1 capsule (100 mg total) by mouth 2 (two) times daily as needed for cough. (Patient not taking: Reported on 03/04/2023) 20 capsule 0   No facility-administered medications prior to visit.    ROS: Review of Systems  Constitutional:  Positive for fatigue. Negative for activity change, appetite change, chills and  unexpected weight change.  HENT:  Negative for congestion, mouth sores and sinus pressure.   Eyes:  Negative for visual disturbance.  Respiratory:  Positive for cough. Negative for chest tightness.   Gastrointestinal:  Negative for abdominal pain and nausea.  Genitourinary:  Negative for difficulty urinating, frequency and vaginal pain.  Musculoskeletal:  Negative for back pain and gait problem.  Skin:  Negative for pallor and rash.  Neurological:  Negative for dizziness, tremors, weakness, numbness and headaches.  Psychiatric/Behavioral:  Negative for confusion and sleep disturbance. The patient is not nervous/anxious.     Objective:  BP 120/78 (BP Location: Left Arm, Patient Position: Sitting, Cuff Size: Normal)   Pulse 77   Temp 98.8 F (37.1 C) (Oral)   Ht 5' 3 (1.6 m)   Wt 158 lb (71.7 kg)   SpO2 98%   BMI 27.99 kg/m   BP Readings from Last 3 Encounters:  03/04/23 120/78  01/21/23 130/74  12/02/22 120/78    Wt Readings from Last 3 Encounters:  03/04/23 158 lb (71.7 kg)  01/21/23 157 lb (71.2 kg)  12/02/22 159 lb (72.1 kg)    Physical Exam Constitutional:      General: She is not in acute distress.    Appearance: Normal appearance. She is well-developed.  HENT:     Head: Normocephalic.     Right Ear: External ear normal.  Left Ear: External ear normal.     Nose: Nose normal.  Eyes:     General:        Right eye: No discharge.        Left eye: No discharge.     Conjunctiva/sclera: Conjunctivae normal.     Pupils: Pupils are equal, round, and reactive to light.  Neck:     Thyroid : No thyromegaly.     Vascular: No JVD.     Trachea: No tracheal deviation.  Cardiovascular:     Rate and Rhythm: Normal rate and regular rhythm.     Heart sounds: Normal heart sounds.  Pulmonary:     Effort: No respiratory distress.     Breath sounds: No stridor. No wheezing.  Abdominal:     General: Bowel sounds are normal. There is no distension.     Palpations: Abdomen  is soft. There is no mass.     Tenderness: There is no abdominal tenderness. There is no guarding or rebound.  Musculoskeletal:        General: No tenderness.     Cervical back: Normal range of motion and neck supple. No rigidity.  Lymphadenopathy:     Cervical: No cervical adenopathy.  Skin:    Findings: No erythema or rash.  Neurological:     Cranial Nerves: No cranial nerve deficit.     Motor: No abnormal muscle tone.     Coordination: Coordination normal.     Deep Tendon Reflexes: Reflexes normal.  Psychiatric:        Behavior: Behavior normal.        Thought Content: Thought content normal.        Judgment: Judgment normal.     Lab Results  Component Value Date   WBC 7.7 01/31/2022   HGB 13.4 01/31/2022   HCT 39.2 01/31/2022   PLT 313.0 01/31/2022   GLUCOSE 90 01/31/2022   CHOL 269 (H) 01/31/2022   TRIG 121.0 01/31/2022   HDL 69.80 01/31/2022   LDLCALC 175 (H) 01/31/2022   ALT 20 01/31/2022   AST 14 01/31/2022   NA 140 01/31/2022   K 4.1 01/31/2022   CL 104 01/31/2022   CREATININE 0.69 01/31/2022   BUN 11 01/31/2022   CO2 28 01/31/2022   TSH 1.52 01/31/2022   INR 1.2 (H) 11/18/2012   HGBA1C 5.2 12/23/2012    DG Chest 2 View Result Date: 01/21/2023 CLINICAL DATA:  58 year old female with cough. EXAM: CHEST - 2 VIEW COMPARISON:  Cardiac CT 03/27/2020 and earlier. FINDINGS: Lung volumes and mediastinal contours remain normal. Visualized tracheal air column is within normal limits. No pneumothorax, pulmonary edema, pleural effusion or consolidation. However, there is asymmetric mild peribronchial opacity in the right lower lung on the PA view, not correlated on the lateral. Elsewhere lung markings appear stable since 2022 and earlier. No acute osseous abnormality identified. Negative visible bowel gas. IMPRESSION: Difficult to exclude mild/early bronchopneumonia in the right lower lung. No consolidation or pleural effusion. Electronically Signed   By: VEAR Hurst M.D.    On: 01/21/2023 11:06    Assessment & Plan:   Problem List Items Addressed This Visit     Asthma - Primary   Worse Try Dymista ; on Singulair  Medrol  pack      Obesity   Will increase Zepbound       Relevant Medications   tirzepatide  5 MG/0.5ML injection vial   History of COVID-19    w/RLL CAP Repeat CXR       CAP (  community acquired pneumonia)   12.2024  Post COVID w/RLL CAP Repeat  CXR      Relevant Medications   promethazine -dextromethorphan (PROMETHAZINE -DM) 6.25-15 MG/5ML syrup   Azelastine -Fluticasone  (DYMISTA ) 137-50 MCG/ACT SUSP   Other Relevant Orders   DG Chest 2 View      Meds ordered this encounter  Medications   promethazine -dextromethorphan (PROMETHAZINE -DM) 6.25-15 MG/5ML syrup    Sig: Take 5 mLs by mouth 4 (four) times daily as needed for cough. Do not take with alcohol or while driving or operating heavy machinery.  May cause drowsiness.    Dispense:  240 mL    Refill:  0   Azelastine -Fluticasone  (DYMISTA ) 137-50 MCG/ACT SUSP    Sig: Place 1 Act into the nose 1 day or 1 dose.    Dispense:  23 g    Refill:  5   tirzepatide  5 MG/0.5ML injection vial    Sig: Inject 5 mg into the skin once a week.    Dispense:  2 mL    Refill:  3      Follow-up: No follow-ups on file.  Marolyn Noel, MD

## 2023-03-04 NOTE — Assessment & Plan Note (Signed)
 Will increase Zepbound

## 2023-03-04 NOTE — Assessment & Plan Note (Signed)
w/RLL CAP Repeat CXR

## 2023-03-04 NOTE — Assessment & Plan Note (Signed)
12.2024  Post COVID w/RLL CAP Repeat  CXR

## 2023-03-07 ENCOUNTER — Other Ambulatory Visit (HOSPITAL_COMMUNITY): Payer: Self-pay

## 2023-03-07 ENCOUNTER — Telehealth: Payer: Self-pay

## 2023-03-07 ENCOUNTER — Encounter: Payer: Self-pay | Admitting: Internal Medicine

## 2023-03-07 DIAGNOSIS — J301 Allergic rhinitis due to pollen: Secondary | ICD-10-CM | POA: Diagnosis not present

## 2023-03-07 DIAGNOSIS — J3089 Other allergic rhinitis: Secondary | ICD-10-CM | POA: Diagnosis not present

## 2023-03-07 DIAGNOSIS — J3081 Allergic rhinitis due to animal (cat) (dog) hair and dander: Secondary | ICD-10-CM | POA: Diagnosis not present

## 2023-03-07 NOTE — Telephone Encounter (Signed)
 Pharmacy Patient Advocate Encounter   Received notification from CoverMyMeds that prior authorization for Zepbound  5MG /0.5ML pen-injectors is required/requested.   Insurance verification completed.   The patient is insured through [facility] .   Per test claim: The current 28 day co-pay is, $35.  No PA needed at this time. This test claim was processed through [facility]- copay amounts may vary at other pharmacies due to pharmacy/plan contracts, or as the patient moves through the different stages of their insurance plan.

## 2023-03-10 DIAGNOSIS — L812 Freckles: Secondary | ICD-10-CM | POA: Diagnosis not present

## 2023-03-10 DIAGNOSIS — D225 Melanocytic nevi of trunk: Secondary | ICD-10-CM | POA: Diagnosis not present

## 2023-03-10 DIAGNOSIS — L821 Other seborrheic keratosis: Secondary | ICD-10-CM | POA: Diagnosis not present

## 2023-03-10 DIAGNOSIS — D2261 Melanocytic nevi of right upper limb, including shoulder: Secondary | ICD-10-CM | POA: Diagnosis not present

## 2023-03-12 ENCOUNTER — Other Ambulatory Visit (HOSPITAL_COMMUNITY): Payer: Self-pay

## 2023-03-13 ENCOUNTER — Other Ambulatory Visit (HOSPITAL_COMMUNITY): Payer: Self-pay

## 2023-03-18 ENCOUNTER — Encounter: Payer: Self-pay | Admitting: Internal Medicine

## 2023-03-18 DIAGNOSIS — J301 Allergic rhinitis due to pollen: Secondary | ICD-10-CM | POA: Diagnosis not present

## 2023-03-18 DIAGNOSIS — J3081 Allergic rhinitis due to animal (cat) (dog) hair and dander: Secondary | ICD-10-CM | POA: Diagnosis not present

## 2023-03-18 DIAGNOSIS — J3089 Other allergic rhinitis: Secondary | ICD-10-CM | POA: Diagnosis not present

## 2023-03-22 ENCOUNTER — Encounter: Payer: Self-pay | Admitting: Internal Medicine

## 2023-03-25 ENCOUNTER — Other Ambulatory Visit: Payer: Self-pay | Admitting: Internal Medicine

## 2023-03-25 DIAGNOSIS — J3089 Other allergic rhinitis: Secondary | ICD-10-CM | POA: Diagnosis not present

## 2023-03-25 DIAGNOSIS — J3081 Allergic rhinitis due to animal (cat) (dog) hair and dander: Secondary | ICD-10-CM | POA: Diagnosis not present

## 2023-03-25 DIAGNOSIS — J301 Allergic rhinitis due to pollen: Secondary | ICD-10-CM | POA: Diagnosis not present

## 2023-03-25 MED ORDER — ZEPBOUND 5 MG/0.5ML ~~LOC~~ SOAJ
5.0000 mg | SUBCUTANEOUS | 3 refills | Status: DC
Start: 2023-03-25 — End: 2023-06-12

## 2023-04-01 DIAGNOSIS — J3089 Other allergic rhinitis: Secondary | ICD-10-CM | POA: Diagnosis not present

## 2023-04-01 DIAGNOSIS — J3081 Allergic rhinitis due to animal (cat) (dog) hair and dander: Secondary | ICD-10-CM | POA: Diagnosis not present

## 2023-04-01 DIAGNOSIS — J301 Allergic rhinitis due to pollen: Secondary | ICD-10-CM | POA: Diagnosis not present

## 2023-04-02 DIAGNOSIS — F4321 Adjustment disorder with depressed mood: Secondary | ICD-10-CM | POA: Diagnosis not present

## 2023-04-02 DIAGNOSIS — Z6379 Other stressful life events affecting family and household: Secondary | ICD-10-CM | POA: Diagnosis not present

## 2023-04-02 DIAGNOSIS — F419 Anxiety disorder, unspecified: Secondary | ICD-10-CM | POA: Diagnosis not present

## 2023-04-06 ENCOUNTER — Other Ambulatory Visit: Payer: Self-pay | Admitting: Internal Medicine

## 2023-04-07 ENCOUNTER — Other Ambulatory Visit (HOSPITAL_COMMUNITY): Payer: Self-pay

## 2023-04-11 DIAGNOSIS — J3089 Other allergic rhinitis: Secondary | ICD-10-CM | POA: Diagnosis not present

## 2023-04-11 DIAGNOSIS — J301 Allergic rhinitis due to pollen: Secondary | ICD-10-CM | POA: Diagnosis not present

## 2023-04-11 DIAGNOSIS — J3081 Allergic rhinitis due to animal (cat) (dog) hair and dander: Secondary | ICD-10-CM | POA: Diagnosis not present

## 2023-04-18 DIAGNOSIS — J3081 Allergic rhinitis due to animal (cat) (dog) hair and dander: Secondary | ICD-10-CM | POA: Diagnosis not present

## 2023-04-18 DIAGNOSIS — J301 Allergic rhinitis due to pollen: Secondary | ICD-10-CM | POA: Diagnosis not present

## 2023-04-18 DIAGNOSIS — J3089 Other allergic rhinitis: Secondary | ICD-10-CM | POA: Diagnosis not present

## 2023-04-29 DIAGNOSIS — J3081 Allergic rhinitis due to animal (cat) (dog) hair and dander: Secondary | ICD-10-CM | POA: Diagnosis not present

## 2023-04-29 DIAGNOSIS — J3089 Other allergic rhinitis: Secondary | ICD-10-CM | POA: Diagnosis not present

## 2023-04-29 DIAGNOSIS — J301 Allergic rhinitis due to pollen: Secondary | ICD-10-CM | POA: Diagnosis not present

## 2023-05-07 DIAGNOSIS — J301 Allergic rhinitis due to pollen: Secondary | ICD-10-CM | POA: Diagnosis not present

## 2023-05-08 DIAGNOSIS — J3089 Other allergic rhinitis: Secondary | ICD-10-CM | POA: Diagnosis not present

## 2023-05-08 DIAGNOSIS — J3081 Allergic rhinitis due to animal (cat) (dog) hair and dander: Secondary | ICD-10-CM | POA: Diagnosis not present

## 2023-05-15 DIAGNOSIS — J301 Allergic rhinitis due to pollen: Secondary | ICD-10-CM | POA: Diagnosis not present

## 2023-05-15 DIAGNOSIS — J3081 Allergic rhinitis due to animal (cat) (dog) hair and dander: Secondary | ICD-10-CM | POA: Diagnosis not present

## 2023-05-15 DIAGNOSIS — J3089 Other allergic rhinitis: Secondary | ICD-10-CM | POA: Diagnosis not present

## 2023-05-29 DIAGNOSIS — J301 Allergic rhinitis due to pollen: Secondary | ICD-10-CM | POA: Diagnosis not present

## 2023-05-29 DIAGNOSIS — J3089 Other allergic rhinitis: Secondary | ICD-10-CM | POA: Diagnosis not present

## 2023-05-29 DIAGNOSIS — J3081 Allergic rhinitis due to animal (cat) (dog) hair and dander: Secondary | ICD-10-CM | POA: Diagnosis not present

## 2023-06-03 ENCOUNTER — Telehealth: Payer: Self-pay | Admitting: Internal Medicine

## 2023-06-03 ENCOUNTER — Encounter: Payer: Self-pay | Admitting: Internal Medicine

## 2023-06-03 NOTE — Telephone Encounter (Signed)
 Attempted to reach out to pt for more details on sxs (blood in urine, pelvic or abdominal pain, frequency, urgency, odor, discharge, etc.) a/w time frame her symptoms have been going on for?Shannon [name]

## 2023-06-03 NOTE — Telephone Encounter (Signed)
 Copied from CRM [id number]. Topic: Clinical - Medical Advice >> Jun 03, 2023  9:30 AM Ovid Blow wrote: Reason for CRM: patient think she has a bladder infection and needs antibotics Please call patient regarding this matter

## 2023-06-03 NOTE — Telephone Encounter (Signed)
 Sxs per pt is ongoing for x3 days urgency, frequency,  dysuria, pelvic pain and low-grade fever. Pt states she has taken Azo. Pt asked that an abx be sent to pharmacy on file.

## 2023-06-04 MED ORDER — CEPHALEXIN 500 MG PO CAPS
1000.0000 mg | ORAL_CAPSULE | Freq: Two times a day (BID) | ORAL | 0 refills | Status: DC
Start: 2023-06-04 — End: 2023-06-12

## 2023-06-04 NOTE — Addendum Note (Signed)
 Addended by: Fabrizzio Marcella V on: 06/04/2023 07:32 AM   Modules accepted: Orders

## 2023-06-04 NOTE — Telephone Encounter (Signed)
Okay.  Office visit if not better.  Thanks °

## 2023-06-08 ENCOUNTER — Other Ambulatory Visit: Payer: Self-pay | Admitting: Internal Medicine

## 2023-06-12 ENCOUNTER — Encounter: Payer: Self-pay | Admitting: Internal Medicine

## 2023-06-12 ENCOUNTER — Ambulatory Visit: Payer: BC Managed Care – PPO | Admitting: Internal Medicine

## 2023-06-12 VITALS — BP 118/80 | HR 77 | Temp 98.0°F | Ht 63.0 in | Wt 147.0 lb

## 2023-06-12 DIAGNOSIS — Z0000 Encounter for general adult medical examination without abnormal findings: Secondary | ICD-10-CM | POA: Diagnosis not present

## 2023-06-12 DIAGNOSIS — E66811 Obesity, class 1: Secondary | ICD-10-CM | POA: Diagnosis not present

## 2023-06-12 DIAGNOSIS — E6609 Other obesity due to excess calories: Secondary | ICD-10-CM

## 2023-06-12 DIAGNOSIS — F331 Major depressive disorder, recurrent, moderate: Secondary | ICD-10-CM | POA: Diagnosis not present

## 2023-06-12 DIAGNOSIS — E538 Deficiency of other specified B group vitamins: Secondary | ICD-10-CM

## 2023-06-12 DIAGNOSIS — R300 Dysuria: Secondary | ICD-10-CM | POA: Diagnosis not present

## 2023-06-12 DIAGNOSIS — J301 Allergic rhinitis due to pollen: Secondary | ICD-10-CM | POA: Diagnosis not present

## 2023-06-12 DIAGNOSIS — Z6830 Body mass index (BMI) 30.0-30.9, adult: Secondary | ICD-10-CM

## 2023-06-12 DIAGNOSIS — J3081 Allergic rhinitis due to animal (cat) (dog) hair and dander: Secondary | ICD-10-CM | POA: Diagnosis not present

## 2023-06-12 DIAGNOSIS — J3089 Other allergic rhinitis: Secondary | ICD-10-CM | POA: Diagnosis not present

## 2023-06-12 LAB — COMPREHENSIVE METABOLIC PANEL WITH GFR
ALT: 12 U/L (ref 0–35)
AST: 12 U/L (ref 0–37)
Albumin: 4.6 g/dL (ref 3.5–5.2)
Alkaline Phosphatase: 70 U/L (ref 39–117)
BUN: 15 mg/dL (ref 6–23)
CO2: 24 meq/L (ref 19–32)
Calcium: 9.6 mg/dL (ref 8.4–10.5)
Chloride: 104 meq/L (ref 96–112)
Creatinine, Ser: 0.67 mg/dL (ref 0.40–1.20)
GFR: 97.91 mL/min (ref >60.00)
Glucose, Bld: 87 mg/dL (ref 70–99)
Potassium: 4 meq/L (ref 3.5–5.1)
Sodium: 138 meq/L (ref 135–145)
Total Bilirubin: 0.6 mg/dL (ref 0.2–1.2)
Total Protein: 7.4 g/dL (ref 6.0–8.3)

## 2023-06-12 LAB — CBC WITH DIFFERENTIAL/PLATELET
Basophils Absolute: 0.1 10*3/uL (ref 0.0–0.1)
Basophils Relative: 0.8 % (ref 0.0–3.0)
Eosinophils Absolute: 0.2 10*3/uL (ref 0.0–0.7)
Eosinophils Relative: 2.6 % (ref 0.0–5.0)
HCT: 38.5 % (ref 36.0–46.0)
Hemoglobin: 13.2 g/dL (ref 12.0–15.0)
Lymphocytes Relative: 26.4 % (ref 12.0–46.0)
Lymphs Abs: 2.1 10*3/uL (ref 0.7–4.0)
MCHC: 34.4 g/dL (ref 30.0–36.0)
MCV: 90.9 fl (ref 78.0–100.0)
Monocytes Absolute: 0.6 10*3/uL (ref 0.1–1.0)
Monocytes Relative: 7.2 % (ref 3.0–12.0)
Neutro Abs: 4.9 10*3/uL (ref 1.4–7.7)
Neutrophils Relative %: 63 % (ref 43.0–77.0)
Platelets: 295 10*3/uL (ref 150.0–400.0)
RBC: 4.23 Mil/uL (ref 3.87–5.11)
RDW: 12.5 % (ref 11.5–15.5)
WBC: 7.8 10*3/uL (ref 4.0–10.5)

## 2023-06-12 LAB — URINALYSIS, ROUTINE W REFLEX MICROSCOPIC
Bilirubin Urine: NEGATIVE
Ketones, ur: NEGATIVE
Nitrite: NEGATIVE
Specific Gravity, Urine: 1.025 (ref 1.000–1.030)
Total Protein, Urine: NEGATIVE
Urine Glucose: NEGATIVE
Urobilinogen, UA: 0.2 (ref 0.0–1.0)
pH: 6 (ref 5.0–8.0)

## 2023-06-12 LAB — LIPID PANEL
Cholesterol: 229 mg/dL — ABNORMAL HIGH (ref 0–200)
HDL: 56.6 mg/dL (ref >39.00)
LDL Cholesterol: 156 mg/dL — ABNORMAL HIGH (ref 0–99)
NonHDL: 172.86
Total CHOL/HDL Ratio: 4
Triglycerides: 85 mg/dL (ref 0.0–149.0)
VLDL: 17 mg/dL (ref 0.0–40.0)

## 2023-06-12 LAB — TSH: TSH: 1.39 u[IU]/mL (ref 0.35–5.50)

## 2023-06-12 LAB — VITAMIN D 25 HYDROXY (VIT D DEFICIENCY, FRACTURES): VITD: 26.97 ng/mL — ABNORMAL LOW (ref 30.00–100.00)

## 2023-06-12 LAB — VITAMIN B12: Vitamin B-12: 559 pg/mL (ref 211–911)

## 2023-06-12 MED ORDER — ZEPBOUND 5 MG/0.5ML ~~LOC~~ SOAJ: 5.0000 mg | SUBCUTANEOUS | 5 refills | Status: DC

## 2023-06-12 MED ORDER — CEPHALEXIN 500 MG PO CAPS: 1000.0000 mg | ORAL_CAPSULE | Freq: Two times a day (BID) | ORAL | 0 refills | Status: DC

## 2023-06-12 NOTE — Progress Notes (Signed)
 Subjective:  Patient ID: Shannon [name], female    DOB: [birth year]  Age: 58 y.o. MRN: [id number]  CC: Medical Management of Chronic Issues (3 mnth f/u, Pt would like Urine cx done to check if she still has UTI as she is still having a wake up and use the restroom a/w some pelvic discomfort)   HPI [name] presents for UTI, wt management  Outpatient Medications Prior to Visit  Medication Sig Dispense Refill   ALPRAZolam  (XANAX ) 0.5 MG tablet TAKE 1 TABLET(0.5 MG) BY MOUTH AT BEDTIME AS NEEDED FOR SLEEP 30 tablet 2   azelastine  (OPTIVAR ) 0.05 % ophthalmic solution Place 1 drop into both eyes as needed.     Azelastine -Fluticasone  (DYMISTA ) 137-50 MCG/ACT SUSP Place 1 Act into the nose 1 day or 1 dose. 23 g 5   Cyanocobalamin  (VITAMIN B-12 PO) Take 1 tablet by mouth daily.     dicyclomine  (BENTYL ) 10 MG capsule Take 1 capsule (10 mg total) by mouth 3 (three) times daily before meals. 60 capsule 0   EPINEPHrine 0.3 mg/0.3 mL IJ SOAJ injection      levocetirizine (XYZAL) 5 MG tablet Take 1 tablet by mouth daily.     montelukast  (SINGULAIR ) 10 MG tablet TAKE 1 TABLET BY MOUTH EVERY DAY IN THE EVENING FOR 7 DAYS     promethazine -dextromethorphan (PROMETHAZINE -DM) 6.25-15 MG/5ML syrup Take 5 mLs by mouth 4 (four) times daily as needed for cough. Do not take with alcohol or while driving or operating heavy machinery.  May cause drowsiness. 240 mL 0   venlafaxine  XR (EFFEXOR -XR) 37.5 MG 24 hr capsule TAKE 1 CAPSULE(37.5 MG) BY MOUTH DAILY WITH BREAKFAST 30 capsule 5   tirzepatide  (ZEPBOUND ) 5 MG/0.5ML Pen Inject 5 mg into the skin once a week. 2 mL 3   azithromycin  (ZITHROMAX ) 250 MG tablet Take (2) tablets by mouth on day 1, then take (1) tablet by mouth on days 2-5. (Patient not taking: Reported on 06/12/2023) 6 tablet 0   benzonatate  (TESSALON ) 100 MG capsule Take 1 capsule (100 mg total) by mouth 2 (two) times daily as needed for cough. (Patient not taking: Reported on 06/12/2023) 20  capsule 0   cephALEXin  (KEFLEX ) 500 MG capsule Take 2 capsules (1,000 mg total) by mouth 2 (two) times daily. (Patient not taking: Reported on 06/12/2023) 20 capsule 0   No facility-administered medications prior to visit.    ROS: Review of Systems  Constitutional:  Negative for activity change, appetite change, chills, fatigue and unexpected weight change.  HENT:  Negative for congestion, mouth sores and sinus pressure.   Eyes:  Negative for visual disturbance.  Respiratory:  Negative for cough and chest tightness.   Gastrointestinal:  Negative for abdominal pain and nausea.  Genitourinary:  Positive for frequency. Negative for difficulty urinating and vaginal pain.  Musculoskeletal:  Negative for back pain and gait problem.  Skin:  Negative for pallor and rash.  Neurological:  Negative for dizziness, tremors, weakness, numbness and headaches.  Psychiatric/Behavioral:  Negative for confusion, sleep disturbance and suicidal ideas. The patient is not nervous/anxious.     Objective:  BP 118/80   Pulse 77   Temp 98 F (36.7 C) (Oral)   Ht 5\' 3"  (1.6 m)   Wt 147 lb (66.7 kg)   SpO2 98%   BMI 26.04 kg/m   BP Readings from Last 3 Encounters:  06/12/23 118/80  03/04/23 120/78  01/21/23 130/74    Wt Readings from Last 3 Encounters:  06/12/23  147 lb (66.7 kg)  03/04/23 158 lb (71.7 kg)  01/21/23 157 lb (71.2 kg)    Physical Exam Constitutional:      General: She is not in acute distress.    Appearance: She is well-developed.  HENT:     Head: Normocephalic.     Right Ear: External ear normal.     Left Ear: External ear normal.     Nose: Nose normal.  Eyes:     General:        Right eye: No discharge.        Left eye: No discharge.     Conjunctiva/sclera: Conjunctivae normal.     Pupils: Pupils are equal, round, and reactive to light.  Neck:     Thyroid : No thyromegaly.     Vascular: No JVD.     Trachea: No tracheal deviation.  Cardiovascular:     Rate and Rhythm:  Normal rate and regular rhythm.     Heart sounds: Normal heart sounds.  Pulmonary:     Effort: No respiratory distress.     Breath sounds: No stridor. No wheezing.  Abdominal:     General: Bowel sounds are normal. There is no distension.     Palpations: Abdomen is soft. There is no mass.     Tenderness: There is no abdominal tenderness. There is no guarding or rebound.  Musculoskeletal:        General: No tenderness.     Cervical back: Normal range of motion and neck supple. No rigidity.  Lymphadenopathy:     Cervical: No cervical adenopathy.  Skin:    Findings: No erythema or rash.  Neurological:     Cranial Nerves: No cranial nerve deficit.     Motor: No abnormal muscle tone.     Coordination: Coordination normal.     Deep Tendon Reflexes: Reflexes normal.  Psychiatric:        Behavior: Behavior normal.        Thought Content: Thought content normal.        Judgment: Judgment normal.     Lab Results  Component Value Date   WBC 7.7 01/31/2022   HGB 13.4 01/31/2022   HCT 39.2 01/31/2022   PLT 313.0 01/31/2022   GLUCOSE 90 01/31/2022   CHOL 269 (H) 01/31/2022   TRIG 121.0 01/31/2022   HDL 69.80 01/31/2022   LDLCALC 175 (H) 01/31/2022   ALT 20 01/31/2022   AST 14 01/31/2022   NA 140 01/31/2022   K 4.1 01/31/2022   CL 104 01/31/2022   CREATININE 0.69 01/31/2022   BUN 11 01/31/2022   CO2 28 01/31/2022   TSH 1.52 01/31/2022   INR 1.2 (H) 11/18/2012   HGBA1C 5.2 12/23/2012    DG Chest 2 View Result Date: 01/21/2023 CLINICAL DATA:  58 year old female with cough. EXAM: CHEST - 2 VIEW COMPARISON:  Cardiac CT 03/27/2020 and earlier. FINDINGS: Lung volumes and mediastinal contours remain normal. Visualized tracheal air column is within normal limits. No pneumothorax, pulmonary edema, pleural effusion or consolidation. However, there is asymmetric mild peribronchial opacity in the right lower lung on the PA view, not correlated on the lateral. Elsewhere lung markings appear  stable since 2022 and earlier. No acute osseous abnormality identified. Negative visible bowel gas. IMPRESSION: Difficult to exclude mild/early bronchopneumonia in the right lower lung. No consolidation or pleural effusion. Electronically Signed   By: Marlise Simpers M.D.   On: 01/21/2023 11:06    Assessment & Plan:   Problem List Items Addressed  This Visit     Depression   Situational OK to taper off and d/c Effexor  XR      Well adult exam   Relevant Orders   TSH   CBC with Differential/Platelet   Lipid panel   Comprehensive metabolic panel with GFR   Vitamin B12   VITAMIN D  25 Hydroxy (Vit-D Deficiency, Fractures)   Obesity   Wt Readings from Last 3 Encounters:  06/12/23 147 lb (66.7 kg)  03/04/23 158 lb (71.7 kg)  01/21/23 157 lb (71.2 kg)  On Zepbound        Relevant Medications   tirzepatide  (ZEPBOUND ) 5 MG/0.5ML Pen   B12 deficiency   On B12  Check B12      Relevant Orders   Vitamin B12   VITAMIN D  25 Hydroxy (Vit-D Deficiency, Fractures)   Other Visit Diagnoses       Dysuria    -  Primary   Relevant Orders   Urinalysis         Meds ordered this encounter  Medications   tirzepatide  (ZEPBOUND ) 5 MG/0.5ML Pen    Sig: Inject 5 mg into the skin once a week.    Dispense:  2 mL    Refill:  5   cephALEXin  (KEFLEX ) 500 MG capsule    Sig: Take 2 capsules (1,000 mg total) by mouth 2 (two) times daily.    Dispense:  20 capsule    Refill:  0      Follow-up: Return in about 3 months (around 09/12/2023) for a follow-up visit.  Anitra Barn, MD

## 2023-06-12 NOTE — Assessment & Plan Note (Signed)
 Wt Readings from Last 3 Encounters:  06/12/23 147 lb (66.7 kg)  03/04/23 158 lb (71.7 kg)  01/21/23 157 lb (71.2 kg)  On Zepbound 

## 2023-06-12 NOTE — Assessment & Plan Note (Signed)
 Situational OK to taper off and d/c Effexor  XR

## 2023-06-12 NOTE — Assessment & Plan Note (Signed)
On B12 Check B12 

## 2023-06-13 ENCOUNTER — Other Ambulatory Visit: Payer: Self-pay | Admitting: Internal Medicine

## 2023-06-13 ENCOUNTER — Ambulatory Visit: Payer: Self-pay | Admitting: Internal Medicine

## 2023-06-13 MED ORDER — CEPHALEXIN 500 MG PO CAPS: 1000.0000 mg | ORAL_CAPSULE | Freq: Two times a day (BID) | ORAL | 1 refills | Status: AC

## 2023-06-17 DIAGNOSIS — J3081 Allergic rhinitis due to animal (cat) (dog) hair and dander: Secondary | ICD-10-CM | POA: Diagnosis not present

## 2023-06-17 DIAGNOSIS — J3089 Other allergic rhinitis: Secondary | ICD-10-CM | POA: Diagnosis not present

## 2023-06-17 DIAGNOSIS — J301 Allergic rhinitis due to pollen: Secondary | ICD-10-CM | POA: Diagnosis not present

## 2023-06-26 DIAGNOSIS — J3089 Other allergic rhinitis: Secondary | ICD-10-CM | POA: Diagnosis not present

## 2023-06-26 DIAGNOSIS — J301 Allergic rhinitis due to pollen: Secondary | ICD-10-CM | POA: Diagnosis not present

## 2023-06-26 DIAGNOSIS — J3081 Allergic rhinitis due to animal (cat) (dog) hair and dander: Secondary | ICD-10-CM | POA: Diagnosis not present

## 2023-07-01 DIAGNOSIS — Z6827 Body mass index (BMI) 27.0-27.9, adult: Secondary | ICD-10-CM | POA: Diagnosis not present

## 2023-07-01 DIAGNOSIS — Z124 Encounter for screening for malignant neoplasm of cervix: Secondary | ICD-10-CM | POA: Diagnosis not present

## 2023-07-01 DIAGNOSIS — Z01419 Encounter for gynecological examination (general) (routine) without abnormal findings: Secondary | ICD-10-CM | POA: Diagnosis not present

## 2023-07-01 DIAGNOSIS — Z1151 Encounter for screening for human papillomavirus (HPV): Secondary | ICD-10-CM | POA: Diagnosis not present

## 2023-07-01 DIAGNOSIS — Z130 Encounter for screening for diseases of the blood and blood-forming organs and certain disorders involving the immune mechanism: Secondary | ICD-10-CM | POA: Diagnosis not present

## 2023-07-01 DIAGNOSIS — Z1231 Encounter for screening mammogram for malignant neoplasm of breast: Secondary | ICD-10-CM | POA: Diagnosis not present

## 2023-07-10 DIAGNOSIS — J301 Allergic rhinitis due to pollen: Secondary | ICD-10-CM | POA: Diagnosis not present

## 2023-07-10 DIAGNOSIS — J3089 Other allergic rhinitis: Secondary | ICD-10-CM | POA: Diagnosis not present

## 2023-07-10 DIAGNOSIS — J3081 Allergic rhinitis due to animal (cat) (dog) hair and dander: Secondary | ICD-10-CM | POA: Diagnosis not present

## 2023-07-16 DIAGNOSIS — F419 Anxiety disorder, unspecified: Secondary | ICD-10-CM | POA: Diagnosis not present

## 2023-07-16 DIAGNOSIS — F4321 Adjustment disorder with depressed mood: Secondary | ICD-10-CM | POA: Diagnosis not present

## 2023-07-16 DIAGNOSIS — Z6379 Other stressful life events affecting family and household: Secondary | ICD-10-CM | POA: Diagnosis not present

## 2023-07-17 DIAGNOSIS — J301 Allergic rhinitis due to pollen: Secondary | ICD-10-CM | POA: Diagnosis not present

## 2023-07-17 DIAGNOSIS — J3089 Other allergic rhinitis: Secondary | ICD-10-CM | POA: Diagnosis not present

## 2023-07-17 DIAGNOSIS — Z13820 Encounter for screening for osteoporosis: Secondary | ICD-10-CM | POA: Diagnosis not present

## 2023-07-17 DIAGNOSIS — J3081 Allergic rhinitis due to animal (cat) (dog) hair and dander: Secondary | ICD-10-CM | POA: Diagnosis not present

## 2023-08-08 ENCOUNTER — Other Ambulatory Visit (HOSPITAL_COMMUNITY): Payer: Self-pay

## 2023-08-11 ENCOUNTER — Ambulatory Visit: Admitting: Internal Medicine

## 2023-08-11 ENCOUNTER — Encounter: Payer: Self-pay | Admitting: Internal Medicine

## 2023-08-11 VITALS — BP 118/70 | HR 86 | Temp 99.5°F | Ht 63.0 in

## 2023-08-11 DIAGNOSIS — H1045 Other chronic allergic conjunctivitis: Secondary | ICD-10-CM | POA: Insufficient documentation

## 2023-08-11 DIAGNOSIS — J4530 Mild persistent asthma, uncomplicated: Secondary | ICD-10-CM | POA: Insufficient documentation

## 2023-08-11 DIAGNOSIS — J3081 Allergic rhinitis due to animal (cat) (dog) hair and dander: Secondary | ICD-10-CM | POA: Insufficient documentation

## 2023-08-11 DIAGNOSIS — E538 Deficiency of other specified B group vitamins: Secondary | ICD-10-CM

## 2023-08-11 DIAGNOSIS — J301 Allergic rhinitis due to pollen: Secondary | ICD-10-CM | POA: Insufficient documentation

## 2023-08-11 DIAGNOSIS — R1013 Epigastric pain: Secondary | ICD-10-CM | POA: Diagnosis not present

## 2023-08-11 DIAGNOSIS — R509 Fever, unspecified: Secondary | ICD-10-CM | POA: Diagnosis not present

## 2023-08-11 LAB — POC URINALSYSI DIPSTICK (AUTOMATED)
Bilirubin, UA: NEGATIVE
Blood, UA: NEGATIVE
Glucose, UA: NEGATIVE
Ketones, UA: NEGATIVE
Nitrite, UA: NEGATIVE
Protein, UA: POSITIVE — AB
Spec Grav, UA: 1.02 (ref 1.010–1.025)
Urobilinogen, UA: 0.2 U/dL
pH, UA: 6 (ref 5.0–8.0)

## 2023-08-11 MED ORDER — CEFUROXIME AXETIL 250 MG PO TABS
250.0000 mg | ORAL_TABLET | Freq: Two times a day (BID) | ORAL | 0 refills | Status: AC
Start: 2023-08-11 — End: 2023-08-21

## 2023-08-11 NOTE — Assessment & Plan Note (Signed)
 Recurrent-unclear etiology.  Rule out UTI, gallstones etc. Abd US  was ordered. Labs including LFTs, renal tests, CBC Empiric cefuroxime  given EBV panel obtained

## 2023-08-11 NOTE — Progress Notes (Unsigned)
 Subjective:  Patient ID: Shannon [name], female    DOB: Nov 16, 1965  Age: 58 y.o. MRN: [id number]  CC: Fever (Pt states she has been having a fever as high as 101 x1 week a/w headache... Pt states fever spikes at night into the 100's)   HPI Shannon [name] presents for fever x 1 week, not feeling well, fatigue, epig discomfort  Outpatient Medications Prior to Visit  Medication Sig Dispense Refill  . ALPRAZolam  (XANAX ) 0.5 MG tablet TAKE 1 TABLET(0.5 MG) BY MOUTH AT BEDTIME AS NEEDED FOR SLEEP 30 tablet 2  . azelastine  (OPTIVAR ) 0.05 % ophthalmic solution Place 1 drop into both eyes as needed.    . Azelastine -Fluticasone  (DYMISTA ) 137-50 MCG/ACT SUSP Place 1 Act into the nose 1 day or 1 dose. 23 g 5  . cephALEXin  (KEFLEX ) 500 MG capsule Take 2 capsules (1,000 mg total) by mouth 2 (two) times daily. 20 capsule 1  . Cyanocobalamin  (VITAMIN B-12 PO) Take 1 tablet by mouth daily.    . dicyclomine  (BENTYL ) 10 MG capsule Take 1 capsule (10 mg total) by mouth 3 (three) times daily before meals. 60 capsule 0  . EPINEPHrine 0.3 mg/0.3 mL IJ SOAJ injection     . levocetirizine (XYZAL) 5 MG tablet Take 1 tablet by mouth daily.    . montelukast  (SINGULAIR ) 10 MG tablet TAKE 1 TABLET BY MOUTH EVERY DAY IN THE EVENING FOR 7 DAYS    . promethazine -dextromethorphan (PROMETHAZINE -DM) 6.25-15 MG/5ML syrup Take 5 mLs by mouth 4 (four) times daily as needed for cough. Do not take with alcohol or while driving or operating heavy machinery.  May cause drowsiness. 240 mL 0  . tirzepatide  (ZEPBOUND ) 5 MG/0.5ML Pen Inject 5 mg into the skin once a week. 2 mL 5  . venlafaxine  XR (EFFEXOR -XR) 37.5 MG 24 hr capsule TAKE 1 CAPSULE(37.5 MG) BY MOUTH DAILY WITH BREAKFAST 30 capsule 5  . benzonatate  (TESSALON ) 100 MG capsule Take 1 capsule (100 mg total) by mouth 2 (two) times daily as needed for cough. (Patient not taking: Reported on 08/11/2023) 20 capsule 0   No facility-administered medications prior to visit.     ROS: Review of Systems  Constitutional:  Positive for fatigue. Negative for activity change, appetite change, chills and unexpected weight change.  HENT:  Negative for congestion, mouth sores and sinus pressure.   Eyes:  Negative for visual disturbance.  Respiratory:  Negative for cough and chest tightness.   Gastrointestinal:  Negative for abdominal pain and nausea.  Genitourinary:  Negative for difficulty urinating, frequency and vaginal pain.  Musculoskeletal:  Negative for back pain and gait problem.  Skin:  Negative for pallor and rash.  Neurological:  Negative for dizziness, tremors, weakness, numbness and headaches.  Psychiatric/Behavioral:  Negative for confusion and sleep disturbance.     Objective:  BP 118/70   Pulse 86   Temp 99.5 F (37.5 C) (Oral)   Ht 5' 3 (1.6 m)   SpO2 98%   BMI 26.04 kg/m   BP Readings from Last 3 Encounters:  08/11/23 118/70  06/12/23 118/80  03/04/23 120/78    Wt Readings from Last 3 Encounters:  06/12/23 147 lb (66.7 kg)  03/04/23 158 lb (71.7 kg)  01/21/23 157 lb (71.2 kg)    Physical Exam  Lab Results  Component Value Date   WBC 7.8 06/12/2023   HGB 13.2 06/12/2023   HCT 38.5 06/12/2023   PLT 295.0 06/12/2023   GLUCOSE 87 06/12/2023   CHOL 229 (  H) 06/12/2023   TRIG 85.0 06/12/2023   HDL 56.60 06/12/2023   LDLCALC 156 (H) 06/12/2023   ALT 12 06/12/2023   AST 12 06/12/2023   NA 138 06/12/2023   K 4.0 06/12/2023   CL 104 06/12/2023   CREATININE 0.67 06/12/2023   BUN 15 06/12/2023   CO2 24 06/12/2023   TSH 1.39 06/12/2023   INR 1.2 (H) 11/18/2012   HGBA1C 5.2 12/23/2012    DG Chest 2 View Result Date: 01/21/2023 CLINICAL DATA:  58 year old female with cough. EXAM: CHEST - 2 VIEW COMPARISON:  Cardiac CT 03/27/2020 and earlier. FINDINGS: Lung volumes and mediastinal contours remain normal. Visualized tracheal air column is within normal limits. No pneumothorax, pulmonary edema, pleural effusion or consolidation.  However, there is asymmetric mild peribronchial opacity in the right lower lung on the PA view, not correlated on the lateral. Elsewhere lung markings appear stable since 2022 and earlier. No acute osseous abnormality identified. Negative visible bowel gas. IMPRESSION: Difficult to exclude mild/early bronchopneumonia in the right lower lung. No consolidation or pleural effusion. Electronically Signed   By: VEAR Hurst M.D.   On: 01/21/2023 11:06    Assessment & Plan:   Problem List Items Addressed This Visit     B12 deficiency   On B12      Fever - Primary   Recurrent Abd US  Labs        Relevant Orders   CBC with Differential/Platelet   Comprehensive metabolic panel with GFR   Lipase   Epstein-Barr virus VCA antibody panel   US  Abdomen Complete      Meds ordered this encounter  Medications  . cefUROXime  (CEFTIN ) 250 MG tablet    Sig: Take 1 tablet (250 mg total) by mouth 2 (two) times daily with a meal for 10 days.    Dispense:  20 tablet    Refill:  0      Follow-up: No follow-ups on file.  Marolyn Noel, MD

## 2023-08-11 NOTE — Assessment & Plan Note (Signed)
 On B12

## 2023-08-12 ENCOUNTER — Ambulatory Visit: Payer: Self-pay | Admitting: Internal Medicine

## 2023-08-12 DIAGNOSIS — J301 Allergic rhinitis due to pollen: Secondary | ICD-10-CM | POA: Diagnosis not present

## 2023-08-12 DIAGNOSIS — J3089 Other allergic rhinitis: Secondary | ICD-10-CM | POA: Diagnosis not present

## 2023-08-12 DIAGNOSIS — J3081 Allergic rhinitis due to animal (cat) (dog) hair and dander: Secondary | ICD-10-CM | POA: Diagnosis not present

## 2023-08-12 DIAGNOSIS — R509 Fever, unspecified: Secondary | ICD-10-CM | POA: Diagnosis not present

## 2023-08-12 LAB — CBC WITH DIFFERENTIAL/PLATELET
Basophils Absolute: 0.1 K/uL (ref 0.0–0.1)
Basophils Relative: 0.9 % (ref 0.0–3.0)
Eosinophils Absolute: 0.2 K/uL (ref 0.0–0.7)
Eosinophils Relative: 2.5 % (ref 0.0–5.0)
HCT: 35.7 % — ABNORMAL LOW (ref 36.0–46.0)
Hemoglobin: 12.4 g/dL (ref 12.0–15.0)
Lymphocytes Relative: 48.9 % — ABNORMAL HIGH (ref 12.0–46.0)
Lymphs Abs: 3 K/uL (ref 0.7–4.0)
MCHC: 34.7 g/dL (ref 30.0–36.0)
MCV: 90.7 fl (ref 78.0–100.0)
Monocytes Absolute: 0.9 K/uL (ref 0.1–1.0)
Monocytes Relative: 15.4 % — ABNORMAL HIGH (ref 3.0–12.0)
Neutro Abs: 2 K/uL (ref 1.4–7.7)
Neutrophils Relative %: 32.3 % — ABNORMAL LOW (ref 43.0–77.0)
Platelets: 178 K/uL (ref 150.0–400.0)
RBC: 3.94 Mil/uL (ref 3.87–5.11)
RDW: 12.3 % (ref 11.5–15.5)
WBC: 6.1 K/uL (ref 4.0–10.5)

## 2023-08-12 LAB — COMPREHENSIVE METABOLIC PANEL WITH GFR
ALT: 88 U/L — ABNORMAL HIGH (ref 0–35)
AST: 57 U/L — ABNORMAL HIGH (ref 0–37)
Albumin: 4.2 g/dL (ref 3.5–5.2)
Alkaline Phosphatase: 124 U/L — ABNORMAL HIGH (ref 39–117)
BUN: 11 mg/dL (ref 6–23)
CO2: 28 meq/L (ref 19–32)
Calcium: 9 mg/dL (ref 8.4–10.5)
Chloride: 104 meq/L (ref 96–112)
Creatinine, Ser: 0.66 mg/dL (ref 0.40–1.20)
GFR: 98.15 mL/min (ref >60.00)
Glucose, Bld: 95 mg/dL (ref 70–99)
Potassium: 3.8 meq/L (ref 3.5–5.1)
Sodium: 139 meq/L (ref 135–145)
Total Bilirubin: 0.5 mg/dL (ref 0.2–1.2)
Total Protein: 6.9 g/dL (ref 6.0–8.3)

## 2023-08-12 LAB — LIPASE: Lipase: 49 U/L (ref 11.0–59.0)

## 2023-08-13 ENCOUNTER — Encounter: Payer: Self-pay | Admitting: Internal Medicine

## 2023-08-13 ENCOUNTER — Inpatient Hospital Stay
Admission: RE | Admit: 2023-08-13 | Discharge: 2023-08-13 | Discharge status: Home / Self Care | Source: Ambulatory Visit | Attending: Internal Medicine | Admitting: Internal Medicine

## 2023-08-13 DIAGNOSIS — R1013 Epigastric pain: Secondary | ICD-10-CM | POA: Insufficient documentation

## 2023-08-13 DIAGNOSIS — N390 Urinary tract infection, site not specified: Secondary | ICD-10-CM | POA: Diagnosis not present

## 2023-08-13 DIAGNOSIS — R1084 Generalized abdominal pain: Secondary | ICD-10-CM | POA: Diagnosis not present

## 2023-08-13 DIAGNOSIS — R509 Fever, unspecified: Secondary | ICD-10-CM | POA: Diagnosis not present

## 2023-08-13 LAB — EPSTEIN-BARR VIRUS VCA ANTIBODY PANEL
EBV NA IgG: 31.4 U/mL — ABNORMAL HIGH
EBV VCA IgG: 95.3 U/mL — ABNORMAL HIGH
EBV VCA IgM: 158 U/mL — ABNORMAL HIGH

## 2023-08-13 NOTE — Assessment & Plan Note (Addendum)
 Recurrent-unclear etiology.  Rule out gallstones etc. Abd US  was ordered. Labs including LFTs, CBC Empiric cefuroxime  given EBV panel obtained Stop Zepbound  for now Go to ER if worse

## 2023-08-14 ENCOUNTER — Encounter: Payer: Self-pay | Admitting: Internal Medicine

## 2023-08-14 DIAGNOSIS — B2790 Infectious mononucleosis, unspecified without complication: Secondary | ICD-10-CM | POA: Insufficient documentation

## 2023-08-26 DIAGNOSIS — J3081 Allergic rhinitis due to animal (cat) (dog) hair and dander: Secondary | ICD-10-CM | POA: Diagnosis not present

## 2023-08-26 DIAGNOSIS — J3089 Other allergic rhinitis: Secondary | ICD-10-CM | POA: Diagnosis not present

## 2023-08-26 DIAGNOSIS — J301 Allergic rhinitis due to pollen: Secondary | ICD-10-CM | POA: Diagnosis not present

## 2023-09-04 DIAGNOSIS — J3081 Allergic rhinitis due to animal (cat) (dog) hair and dander: Secondary | ICD-10-CM | POA: Diagnosis not present

## 2023-09-04 DIAGNOSIS — J3089 Other allergic rhinitis: Secondary | ICD-10-CM | POA: Diagnosis not present

## 2023-09-04 DIAGNOSIS — J301 Allergic rhinitis due to pollen: Secondary | ICD-10-CM | POA: Diagnosis not present

## 2023-09-16 ENCOUNTER — Ambulatory Visit: Admitting: Internal Medicine

## 2023-09-16 ENCOUNTER — Encounter: Payer: Self-pay | Admitting: Internal Medicine

## 2023-09-16 VITALS — BP 116/79 | HR 85 | Temp 98.5°F | Ht 63.0 in | Wt 142.0 lb

## 2023-09-16 DIAGNOSIS — R509 Fever, unspecified: Secondary | ICD-10-CM

## 2023-09-16 DIAGNOSIS — J3081 Allergic rhinitis due to animal (cat) (dog) hair and dander: Secondary | ICD-10-CM | POA: Diagnosis not present

## 2023-09-16 DIAGNOSIS — E538 Deficiency of other specified B group vitamins: Secondary | ICD-10-CM

## 2023-09-16 DIAGNOSIS — F331 Major depressive disorder, recurrent, moderate: Secondary | ICD-10-CM

## 2023-09-16 DIAGNOSIS — J3089 Other allergic rhinitis: Secondary | ICD-10-CM | POA: Diagnosis not present

## 2023-09-16 DIAGNOSIS — B2790 Infectious mononucleosis, unspecified without complication: Secondary | ICD-10-CM | POA: Diagnosis not present

## 2023-09-16 DIAGNOSIS — G4700 Insomnia, unspecified: Secondary | ICD-10-CM

## 2023-09-16 DIAGNOSIS — G9332 Myalgic encephalomyelitis/chronic fatigue syndrome: Secondary | ICD-10-CM

## 2023-09-16 DIAGNOSIS — R1013 Epigastric pain: Secondary | ICD-10-CM | POA: Diagnosis not present

## 2023-09-16 DIAGNOSIS — J301 Allergic rhinitis due to pollen: Secondary | ICD-10-CM | POA: Diagnosis not present

## 2023-09-16 NOTE — Assessment & Plan Note (Signed)
 Much better

## 2023-09-16 NOTE — Progress Notes (Signed)
 Subjective:  Patient ID: Shannon [name], female    DOB: 09-24-1965  Age: 58 y.o. MRN: [id number]  CC: Medical Management of Chronic Issues (3 mnth f/u )   HPI Zakya Brady Plant presents for obesity - lost 30 lbs on Zepbound  On Effexor   F/u on EBV, CFS x 6 months, hot flashes  Outpatient Medications Prior to Visit  Medication Sig Dispense Refill   ALPRAZolam  (XANAX ) 0.5 MG tablet TAKE 1 TABLET(0.5 MG) BY MOUTH AT BEDTIME AS NEEDED FOR SLEEP 30 tablet 2   azelastine  (OPTIVAR ) 0.05 % ophthalmic solution Place 1 drop into both eyes as needed.     Azelastine -Fluticasone  (DYMISTA ) 137-50 MCG/ACT SUSP Place 1 Act into the nose 1 day or 1 dose. 23 g 5   cephALEXin  (KEFLEX ) 500 MG capsule Take 2 capsules (1,000 mg total) by mouth 2 (two) times daily. 20 capsule 1   Cyanocobalamin  (VITAMIN B-12 PO) Take 1 tablet by mouth daily.     dicyclomine  (BENTYL ) 10 MG capsule Take 1 capsule (10 mg total) by mouth 3 (three) times daily before meals. 60 capsule 0   EPINEPHrine 0.3 mg/0.3 mL IJ SOAJ injection      levocetirizine (XYZAL) 5 MG tablet Take 1 tablet by mouth daily.     montelukast  (SINGULAIR ) 10 MG tablet TAKE 1 TABLET BY MOUTH EVERY DAY IN THE EVENING FOR 7 DAYS     promethazine -dextromethorphan (PROMETHAZINE -DM) 6.25-15 MG/5ML syrup Take 5 mLs by mouth 4 (four) times daily as needed for cough. Do not take with alcohol or while driving or operating heavy machinery.  May cause drowsiness. 240 mL 0   tirzepatide  (ZEPBOUND ) 5 MG/0.5ML Pen Inject 5 mg into the skin once a week. 2 mL 5   venlafaxine  XR (EFFEXOR -XR) 37.5 MG 24 hr capsule TAKE 1 CAPSULE(37.5 MG) BY MOUTH DAILY WITH BREAKFAST 30 capsule 5   benzonatate  (TESSALON ) 100 MG capsule Take 1 capsule (100 mg total) by mouth 2 (two) times daily as needed for cough. (Patient not taking: Reported on 09/16/2023) 20 capsule 0   No facility-administered medications prior to visit.    ROS: Review of Systems  Objective:  BP 116/79   Pulse  85   Temp 98.5 F (36.9 C) (Oral)   Ht 5' 3 (1.6 m)   Wt 142 lb (64.4 kg)   SpO2 98%   BMI 25.15 kg/m   BP Readings from Last 3 Encounters:  09/16/23 116/79  08/11/23 118/70  06/12/23 118/80    Wt Readings from Last 3 Encounters:  09/16/23 142 lb (64.4 kg)  06/12/23 147 lb (66.7 kg)  03/04/23 158 lb (71.7 kg)    Physical Exam  Lab Results  Component Value Date   WBC 6.1 08/12/2023   HGB 12.4 08/12/2023   HCT 35.7 (L) 08/12/2023   PLT 178.0 08/12/2023   GLUCOSE 95 08/12/2023   CHOL 229 (H) 06/12/2023   TRIG 85.0 06/12/2023   HDL 56.60 06/12/2023   LDLCALC 156 (H) 06/12/2023   ALT 88 (H) 08/12/2023   AST 57 (H) 08/12/2023   NA 139 08/12/2023   K 3.8 08/12/2023   CL 104 08/12/2023   CREATININE 0.66 08/12/2023   BUN 11 08/12/2023   CO2 28 08/12/2023   TSH 1.39 06/12/2023   INR 1.2 (H) 11/18/2012   HGBA1C 5.2 12/23/2012    US  Abdomen Complete Result Date: 08/13/2023 CLINICAL DATA:  Fever, epig pain, UTI. Thx EXAM: ABDOMEN ULTRASOUND COMPLETE COMPARISON:  None Available. FINDINGS: Gallbladder: No gallstones or wall  thickening visualized. No sonographic Murphy sign noted by sonographer. Common bile duct: Diameter: Up to 4 mm. No intrahepatic bile duct dilation. Liver: No focal lesion identified. Within normal limits in parenchymal echogenicity. Portal vein is patent on color Doppler imaging with normal direction of blood flow towards the liver. IVC: No abnormality visualized. Pancreas: Visualized portion unremarkable. Spleen: Size and appearance within normal limits. Right Kidney: Length: 11.2 cm. Echogenicity within normal limits. No mass or hydronephrosis visualized. Left Kidney: Length: 10.5 cm. Echogenicity within normal limits. No mass or hydronephrosis visualized. Abdominal aorta: No aneurysm visualized. Other findings: None. IMPRESSION: *Unremarkable abdominal ultrasound examination. Electronically Signed   By: Ree Molt M.D.   On: 08/13/2023 11:11     Assessment & Plan:   Problem List Items Addressed This Visit     B12 deficiency - Primary   On B12      Chronic fatigue syndrome   Post-COVID 2024 Post-EBV 2025 We may try Adderall low dose Wean off Effexor        Depression   Much better      EBV infection   ?CFS due to EBV      Relevant Orders   Comprehensive metabolic panel with GFR   Epigastric pain   Recurrent-unclear etiology.  Rule out gallstones etc. AEBV panel (+)        Fever   Due to  acute EBV - resolved after 3 weeks      Insomnia   Resolved         No orders of the defined types were placed in this encounter.     Follow-up: Return in about 3 months (around 12/17/2023) for a follow-up visit.  Marolyn Noel, MD

## 2023-09-16 NOTE — Assessment & Plan Note (Signed)
 Recurrent-unclear etiology.  Rule out gallstones etc. AEBV panel (+)

## 2023-09-16 NOTE — Assessment & Plan Note (Signed)
 Resolved

## 2023-09-16 NOTE — Assessment & Plan Note (Signed)
 On B12

## 2023-09-16 NOTE — Assessment & Plan Note (Signed)
 Due to  acute EBV - resolved after 3 weeks

## 2023-09-16 NOTE — Assessment & Plan Note (Signed)
 Post-COVID 2024 Post-EBV 2025 We may try Adderall low dose Wean off Effexor 

## 2023-09-16 NOTE — Assessment & Plan Note (Signed)
?  CFS due to EBV

## 2023-09-23 DIAGNOSIS — J301 Allergic rhinitis due to pollen: Secondary | ICD-10-CM | POA: Diagnosis not present

## 2023-09-23 DIAGNOSIS — J3089 Other allergic rhinitis: Secondary | ICD-10-CM | POA: Diagnosis not present

## 2023-09-23 DIAGNOSIS — J3081 Allergic rhinitis due to animal (cat) (dog) hair and dander: Secondary | ICD-10-CM | POA: Diagnosis not present

## 2023-09-30 DIAGNOSIS — J3081 Allergic rhinitis due to animal (cat) (dog) hair and dander: Secondary | ICD-10-CM | POA: Diagnosis not present

## 2023-09-30 DIAGNOSIS — J3089 Other allergic rhinitis: Secondary | ICD-10-CM | POA: Diagnosis not present

## 2023-09-30 DIAGNOSIS — J301 Allergic rhinitis due to pollen: Secondary | ICD-10-CM | POA: Diagnosis not present

## 2023-10-07 DIAGNOSIS — J3089 Other allergic rhinitis: Secondary | ICD-10-CM | POA: Diagnosis not present

## 2023-10-07 DIAGNOSIS — J3081 Allergic rhinitis due to animal (cat) (dog) hair and dander: Secondary | ICD-10-CM | POA: Diagnosis not present

## 2023-10-07 DIAGNOSIS — J301 Allergic rhinitis due to pollen: Secondary | ICD-10-CM | POA: Diagnosis not present

## 2023-10-13 ENCOUNTER — Other Ambulatory Visit: Payer: Self-pay | Admitting: Internal Medicine

## 2023-10-13 ENCOUNTER — Encounter: Payer: Self-pay | Admitting: Internal Medicine

## 2023-10-13 MED ORDER — AMPHETAMINE-DEXTROAMPHETAMINE 5 MG PO TABS: 5.0000 mg | ORAL_TABLET | Freq: Two times a day (BID) | ORAL | 0 refills | Status: AC

## 2023-10-14 DIAGNOSIS — J3081 Allergic rhinitis due to animal (cat) (dog) hair and dander: Secondary | ICD-10-CM | POA: Diagnosis not present

## 2023-10-14 DIAGNOSIS — J3089 Other allergic rhinitis: Secondary | ICD-10-CM | POA: Diagnosis not present

## 2023-10-14 DIAGNOSIS — J301 Allergic rhinitis due to pollen: Secondary | ICD-10-CM | POA: Diagnosis not present

## 2023-10-15 ENCOUNTER — Other Ambulatory Visit

## 2023-10-15 DIAGNOSIS — B2790 Infectious mononucleosis, unspecified without complication: Secondary | ICD-10-CM

## 2023-10-15 LAB — COMPREHENSIVE METABOLIC PANEL WITH GFR
ALT: 19 U/L (ref 0–35)
AST: 17 U/L (ref 0–37)
Albumin: 4.3 g/dL (ref 3.5–5.2)
Alkaline Phosphatase: 61 U/L (ref 39–117)
BUN: 11 mg/dL (ref 6–23)
CO2: 26 meq/L (ref 19–32)
Calcium: 9.3 mg/dL (ref 8.4–10.5)
Chloride: 106 meq/L (ref 96–112)
Creatinine, Ser: 0.69 mg/dL (ref 0.40–1.20)
GFR: 96.98 mL/min (ref >60.00)
Glucose, Bld: 84 mg/dL (ref 70–99)
Potassium: 4.1 meq/L (ref 3.5–5.1)
Sodium: 139 meq/L (ref 135–145)
Total Bilirubin: 0.5 mg/dL (ref 0.2–1.2)
Total Protein: 6.8 g/dL (ref 6.0–8.3)

## 2023-10-20 ENCOUNTER — Ambulatory Visit: Payer: Self-pay | Admitting: Internal Medicine

## 2023-10-21 DIAGNOSIS — J3089 Other allergic rhinitis: Secondary | ICD-10-CM | POA: Diagnosis not present

## 2023-10-21 DIAGNOSIS — J3081 Allergic rhinitis due to animal (cat) (dog) hair and dander: Secondary | ICD-10-CM | POA: Diagnosis not present

## 2023-10-21 DIAGNOSIS — J301 Allergic rhinitis due to pollen: Secondary | ICD-10-CM | POA: Diagnosis not present

## 2023-10-31 DIAGNOSIS — J3089 Other allergic rhinitis: Secondary | ICD-10-CM | POA: Diagnosis not present

## 2023-10-31 DIAGNOSIS — J301 Allergic rhinitis due to pollen: Secondary | ICD-10-CM | POA: Diagnosis not present

## 2023-10-31 DIAGNOSIS — J3081 Allergic rhinitis due to animal (cat) (dog) hair and dander: Secondary | ICD-10-CM | POA: Diagnosis not present

## 2023-11-11 DIAGNOSIS — J301 Allergic rhinitis due to pollen: Secondary | ICD-10-CM | POA: Diagnosis not present

## 2023-11-11 DIAGNOSIS — J3081 Allergic rhinitis due to animal (cat) (dog) hair and dander: Secondary | ICD-10-CM | POA: Diagnosis not present

## 2023-11-11 DIAGNOSIS — J3089 Other allergic rhinitis: Secondary | ICD-10-CM | POA: Diagnosis not present

## 2023-11-13 ENCOUNTER — Ambulatory Visit: Admitting: Gastroenterology

## 2023-11-13 ENCOUNTER — Encounter: Payer: Self-pay | Admitting: Gastroenterology

## 2023-11-13 VITALS — BP 122/74 | HR 88 | Ht 63.0 in | Wt 144.2 lb

## 2023-11-13 DIAGNOSIS — K589 Irritable bowel syndrome without diarrhea: Secondary | ICD-10-CM | POA: Diagnosis not present

## 2023-11-13 DIAGNOSIS — R7989 Other specified abnormal findings of blood chemistry: Secondary | ICD-10-CM | POA: Diagnosis not present

## 2023-11-13 DIAGNOSIS — K219 Gastro-esophageal reflux disease without esophagitis: Secondary | ICD-10-CM | POA: Diagnosis not present

## 2023-11-13 DIAGNOSIS — B2790 Infectious mononucleosis, unspecified without complication: Secondary | ICD-10-CM

## 2023-11-13 MED ORDER — DICYCLOMINE HCL 10 MG PO CAPS: 10.0000 mg | ORAL_CAPSULE | Freq: Three times a day (TID) | ORAL | 0 refills | Status: AC

## 2023-11-13 MED ORDER — HYDROCORTISONE ACETATE 25 MG RE SUPP: 25.0000 mg | Freq: Every evening | RECTAL | 0 refills | Status: AC

## 2023-11-13 NOTE — Progress Notes (Signed)
 Chief Complaint:follow-up GERD, IBS Primary GI Doctor:Dr. Federico  HPI: 58 year old Shannon [name] with history of asthma and GERD presents for follow up of diarrhea.   She works as a Environmental consultant at [employer].   Patient last seen in GI office on 09/24/22 by Dr. Federico for follow-up on diarrhea.  Interval History  Patient presents for follow-up on GERD and IBS-D.  She reports since starting Zepbound  a year ago she feels this has helped with the diarrhea.  She has lost 25lbs in past year. She has occasional bouts of the diarrhea that she relates to stress. She has had intermittent BRB with wiping form hemorrhoids.   Patient has occasional GERD and takes OTC antiacids as needed  Patient has chronic fatigue, recently tested positive for EBV. She had fever, fatigue, and epigastric pain for 1 week in July when tested positive. She reports having mono twice when younger. Her LFTs were elevated at that time and now normalized.  She notes she does not sleep well and has considered sleep study.  Wt Readings from Last 3 Encounters:  11/13/23 144 lb 4 oz (65.4 kg)  09/16/23 142 lb (64.4 kg)  06/12/23 147 lb (Shannon.7 kg)    Past Medical History:  Diagnosis Date   Allergy    Arthritis    Asthma    Depression    GERD (gastroesophageal reflux disease)     Past Surgical History:  Procedure Laterality Date   COLONOSCOPY     extraction of wisdom teeth     per pt removed x2 - only had 2 per pt.    Current Outpatient Medications  Medication Sig Dispense Refill   ALPRAZolam  (XANAX ) 0.5 MG tablet TAKE 1 TABLET(0.5 MG) BY MOUTH AT BEDTIME AS NEEDED FOR SLEEP 30 tablet 2   amphetamine -dextroamphetamine  (ADDERALL) 5 MG tablet Take 1 tablet (5 mg total) by mouth 2 (two) times daily with a meal. Breakfast and lunch 60 tablet 0   azelastine  (OPTIVAR ) 0.05 % ophthalmic solution Place 1 drop into both eyes as needed.     Azelastine -Fluticasone  (DYMISTA ) 137-50 MCG/ACT SUSP Place 1 Act into the nose 1  day or 1 dose. 23 g 5   cephALEXin  (KEFLEX ) 500 MG capsule Take 2 capsules (1,000 mg total) by mouth 2 (two) times daily. 20 capsule 1   Cyanocobalamin  (VITAMIN B-12 PO) Take 1 tablet by mouth daily.     EPINEPHrine 0.3 mg/0.3 mL IJ SOAJ injection      hydrocortisone  (ANUSOL -HC) 25 MG suppository Place 1 suppository (25 mg total) rectally at bedtime. 10 suppository 0   levocetirizine (XYZAL) 5 MG tablet Take 1 tablet by mouth daily.     montelukast  (SINGULAIR ) 10 MG tablet TAKE 1 TABLET BY MOUTH EVERY DAY IN THE EVENING FOR 7 DAYS     promethazine -dextromethorphan (PROMETHAZINE -DM) 6.25-15 MG/5ML syrup Take 5 mLs by mouth 4 (four) times daily as needed for cough. Do not take with alcohol or while driving or operating heavy machinery.  Shannon Shannon [name] cause drowsiness. 240 mL 0   tirzepatide  (ZEPBOUND ) 5 MG/0.5ML Pen Inject 5 mg into the skin once a week. 2 mL 5   dicyclomine  (BENTYL ) 10 MG capsule Take 1 capsule (10 mg total) by mouth 3 (three) times daily before meals. 60 capsule 0   No current facility-administered medications for this visit.    Allergies as of 11/13/2023 - Review Complete 11/13/2023  Allergen Reaction Noted   Augmentin  [amoxicillin -pot clavulanate] Diarrhea 02/09/2020   Moxifloxacin  01/12/2008  Family History  Problem Relation Age of Onset   Hyperlipidemia Mother    Dementia Mother    Other Mother        had covid   Healthy Father        died from old age   Colon cancer Maternal Grandfather 53   Cancer - Lung Paternal Grandmother    Depression Daughter    Esophageal cancer Neg Hx    Rectal cancer Neg Hx    Stomach cancer Neg Hx     Review of Systems:    Constitutional: No weight loss, fever, chills, weakness or fatigue HEENT: Eyes: No change in vision               Ears, Nose, Throat:  No change in hearing or congestion Skin: No rash or itching Cardiovascular: No chest pain, chest pressure or palpitations   Respiratory: No SOB or cough Gastrointestinal: See HPI  and otherwise negative Genitourinary: No dysuria or change in urinary frequency Neurological: No headache, dizziness or syncope Musculoskeletal: No new muscle or joint pain Hematologic: No bleeding or bruising Psychiatric: No history of depression or anxiety    Physical Exam:  Vital signs: BP 122/74   Pulse 88   Ht 5' 3 (1.6 m)   Wt 144 lb 4 oz (65.4 kg)   BMI 25.55 kg/m   Constitutional:   Pleasant Shannon [name] appears to be in NAD, Well developed, Well nourished, alert and cooperative Throat: Oral cavity and pharynx without inflammation, swelling or lesion.  Respiratory: Respirations even and unlabored. Lungs clear to auscultation bilaterally.   No wheezes, crackles, or rhonchi.  Cardiovascular: Normal S1, S2. Regular rate and rhythm. No peripheral edema, cyanosis or pallor.  Gastrointestinal:  Soft, nondistended, nontender. No rebound or guarding. Normal bowel sounds. No appreciable masses or hepatomegaly. Rectal:  Not performed.  Msk:  Symmetrical without gross deformities. Without edema, no deformity or joint abnormality.  Neurologic:  Alert and  oriented x4;  grossly normal neurologically.  Skin:   Dry and intact without significant lesions or rashes.  RELEVANT LABS AND IMAGING: CBC    Latest Ref Rng & Units 08/12/2023    9:26 AM 06/12/2023    9:12 AM 01/31/2022   11:24 AM  CBC  WBC 4.0 - 10.5 K/uL 6.1  7.8  7.7   Hemoglobin 12.0 - 15.0 g/dL 87.5  86.7  86.5   Hematocrit 36.0 - 46.0 % 35.7  38.5  39.2   Platelets 150.0 - 400.0 K/uL 178.0  295.0  313.0      CMP     Latest Ref Rng & Units 10/15/2023    8:49 AM 08/12/2023    9:26 AM 06/12/2023    9:12 AM  CMP  Glucose 70 - 99 mg/dL 84  95  87   BUN 6 - 23 mg/dL 11  11  15    Creatinine 0.40 - 1.20 mg/dL 9.30  9.33  9.32   Sodium 135 - 145 mEq/L 139  139  138   Potassium 3.5 - 5.1 mEq/L 4.1  3.8  4.0   Chloride 96 - 112 mEq/L 106  104  104   CO2 19 - 32 mEq/L 26  28  24    Calcium 8.4 - 10.5 mg/dL 9.3  9.0  9.6   Total  Protein 6.0 - 8.3 g/dL 6.8  6.9  7.4   Total Bilirubin 0.2 - 1.2 mg/dL 0.5  0.5  0.6   Alkaline Phos 39 - 117 U/L 61  124  70   AST 0 - 37 U/L 17  57  12   ALT 0 - 35 U/L 19  88  12      Lab Results  Component Value Date   TSH 1.39 06/12/2023  Labs 07/2023:lipase 49, EBV positive, elevated LFTs  Labs 01/2020: CBC nml. CMP nml. TSH nml.   EGD 02/27/09: Gastric fundic gland polyps   Colonoscopy 02/27/09: Rectal polyp (HP). Hemorrhoids.   EGD 10/04/14: Small hiatal hernia. No evidence of GERD. Fundic gland polyps, biopsy confirmed.   Colonoscopy 10/04/14: Normal colonoscopy   Colonoscopy 08/03/21, recall 5 years - The examined portion of the ileum was normal. - One 6 mm polyp in the ascending colon, removed with a cold snare. Resected and retrieved. - One 3 mm polyp in the transverse colon, removed with a cold snare. Resected and retrieved. - Non-bleeding internal hemorrhoids. - Biopsies were taken with a cold forceps from the entire colon for evaluation of microscopic colitis. Path: 1. Surgical [P], colon nos, random sites MILD NONSPECIFIC INFLAMMATION CONSISTENT WITH PREP RELATED CHANGES. THERE ARE NO DIAGNOSTIC FEATURES OF INFLAMMATORY BOWEL DISEASE, MICROSCOPIC COLITIS AND COLLAGENOUS COLITIS. 2. Surgical [P], colon, transverse and ascending, polyp (2) SESSILE SERRATED POLYP WITHOUT CYTOLOGIC DYSPLASIA AND A HYPERPLASTIC POLYP.   08/13/23 US  abdomen complete IMPRESSION: *Unremarkable abdominal ultrasound examination.  Assessment: Encounter Diagnoses  Name Primary?   Irritable bowel syndrome, unspecified type Yes   Gastroesophageal reflux disease, unspecified whether esophagitis present    Epstein Barr virus infection    Elevated LFTs     57 year old Shannon [name] patient that follow-up on GERD and IBS.  Her IBS has been better controlled with Zepbound  has made her more regular.  Patient does have a prescription for dicyclomine  but states she forgot she can use it as needed therefore we  will send refill.  Patient reports currently she feels well.    Patient's GERD is managed with over-the-counter antiacids as needed.  She did mention considering upper endoscopy next summer when she is not working.  She would like to discuss with Dr. Federico at follow-up in March as well as discuss doing colonoscopy early for history of polyps.    For the intermittent issue with internal hemorrhoids we will go ahead and prescribe patient Anusol  suppositories to use as needed.    Lastly patient recently had EBV and at that time her LFTs were noted to be elevated, patient's lab work was retested in September and back to normal.  Patient also had abdominal ultrasound which was normal.  Patient notes chronic fatigue and she has had her vitamin D  and B12 checked.  We discussed sleep hygiene and recommended she consider sleep study if she does not sleep well at night.   Plan: -GERD diet -Over-the-counter antiacids as needed -Dicyclomine  as needed, refilled -consider possible sleep study -follow up in March with Dr. Federico to discuss colon and EGD - Next colonoscopy due in 07/2026   Thank you for the courtesy of this consult. Please call me with any questions or concerns.   Akeira Lahm, FNP-C [facility] 11/13/2023, 12:42 PM  Cc: Plotnikov, Aleksei V, MD

## 2023-11-13 NOTE — Progress Notes (Signed)
 Agree with assessment / plan as outlined.

## 2023-11-25 DIAGNOSIS — J3081 Allergic rhinitis due to animal (cat) (dog) hair and dander: Secondary | ICD-10-CM | POA: Diagnosis not present

## 2023-11-25 DIAGNOSIS — J3089 Other allergic rhinitis: Secondary | ICD-10-CM | POA: Diagnosis not present

## 2023-11-25 DIAGNOSIS — J301 Allergic rhinitis due to pollen: Secondary | ICD-10-CM | POA: Diagnosis not present

## 2023-12-02 DIAGNOSIS — J3081 Allergic rhinitis due to animal (cat) (dog) hair and dander: Secondary | ICD-10-CM | POA: Diagnosis not present

## 2023-12-02 DIAGNOSIS — J3089 Other allergic rhinitis: Secondary | ICD-10-CM | POA: Diagnosis not present

## 2023-12-02 DIAGNOSIS — J301 Allergic rhinitis due to pollen: Secondary | ICD-10-CM | POA: Diagnosis not present

## 2023-12-09 DIAGNOSIS — J3081 Allergic rhinitis due to animal (cat) (dog) hair and dander: Secondary | ICD-10-CM | POA: Diagnosis not present

## 2023-12-09 DIAGNOSIS — J3089 Other allergic rhinitis: Secondary | ICD-10-CM | POA: Diagnosis not present

## 2023-12-09 DIAGNOSIS — J301 Allergic rhinitis due to pollen: Secondary | ICD-10-CM | POA: Diagnosis not present

## 2023-12-18 ENCOUNTER — Ambulatory Visit: Admitting: Internal Medicine

## 2023-12-18 ENCOUNTER — Encounter: Payer: Self-pay | Admitting: Internal Medicine

## 2023-12-18 VITALS — BP 142/100 | HR 82 | Temp 98.5°F | Ht 63.0 in | Wt 141.4 lb

## 2023-12-18 DIAGNOSIS — E538 Deficiency of other specified B group vitamins: Secondary | ICD-10-CM | POA: Diagnosis not present

## 2023-12-18 DIAGNOSIS — U071 COVID-19: Secondary | ICD-10-CM | POA: Diagnosis not present

## 2023-12-18 DIAGNOSIS — E66811 Obesity, class 1: Secondary | ICD-10-CM | POA: Diagnosis not present

## 2023-12-18 DIAGNOSIS — E6609 Other obesity due to excess calories: Secondary | ICD-10-CM | POA: Diagnosis not present

## 2023-12-18 DIAGNOSIS — Z6830 Body mass index (BMI) 30.0-30.9, adult: Secondary | ICD-10-CM

## 2023-12-18 MED ORDER — HYDROCODONE BIT-HOMATROP MBR 5-1.5 MG/5ML PO SOLN: 5.0000 mL | Freq: Three times a day (TID) | ORAL | 0 refills | Status: AC | PRN

## 2023-12-18 MED ORDER — METHYLPREDNISOLONE ACETATE 80 MG/ML IJ SUSP
80.0000 mg | Freq: Once | INTRAMUSCULAR | Status: AC
  Administered 2023-12-18: 80 mg via INTRAMUSCULAR

## 2023-12-18 MED ORDER — AZITHROMYCIN 250 MG PO TABS: ORAL_TABLET | ORAL | 0 refills | Status: AC

## 2023-12-18 NOTE — Progress Notes (Signed)
 Subjective:  Patient ID: Shannon [name], female    DOB: [birth year]  Age: 58 y.o. MRN: [id number]  CC: Medical Management of Chronic Issues (3 Month follow up. Recently COVID+ Monday (symptoms since Sunday))   HPI Sharona Nazareth Norenberg presents for COVID since Sunday (D#5).  [place] lost 30 lbs  Outpatient Medications Prior to Visit  Medication Sig Dispense Refill   ALPRAZolam  (XANAX ) 0.5 MG tablet TAKE 1 TABLET(0.5 MG) BY MOUTH AT BEDTIME AS NEEDED FOR SLEEP 30 tablet 2   amphetamine -dextroamphetamine  (ADDERALL) 5 MG tablet Take 1 tablet (5 mg total) by mouth 2 (two) times daily with a meal. Breakfast and lunch 60 tablet 0   azelastine  (OPTIVAR ) 0.05 % ophthalmic solution Place 1 drop into both eyes as needed.     Azelastine -Fluticasone  (DYMISTA ) 137-50 MCG/ACT SUSP Place 1 Act into the nose 1 day or 1 dose. 23 g 5   cephALEXin  (KEFLEX ) 500 MG capsule Take 2 capsules (1,000 mg total) by mouth 2 (two) times daily. 20 capsule 1   Cyanocobalamin  (VITAMIN B-12 PO) Take 1 tablet by mouth daily.     dicyclomine  (BENTYL ) 10 MG capsule Take 1 capsule (10 mg total) by mouth 3 (three) times daily before meals. 60 capsule 0   EPINEPHrine 0.3 mg/0.3 mL IJ SOAJ injection      hydrocortisone  (ANUSOL -HC) 25 MG suppository Place 1 suppository (25 mg total) rectally at bedtime. 10 suppository 0   levocetirizine (XYZAL) 5 MG tablet Take 1 tablet by mouth daily.     montelukast  (SINGULAIR ) 10 MG tablet TAKE 1 TABLET BY MOUTH EVERY DAY IN THE EVENING FOR 7 DAYS     promethazine -dextromethorphan (PROMETHAZINE -DM) 6.25-15 MG/5ML syrup Take 5 mLs by mouth 4 (four) times daily as needed for cough. Do not take with alcohol or while driving or operating heavy machinery.  May cause drowsiness. 240 mL 0   tirzepatide  (ZEPBOUND ) 5 MG/0.5ML Pen Inject 5 mg into the skin once a week. 2 mL 5   No facility-administered medications prior to visit.    ROS: Review of Systems  Objective:  BP (!) 142/100   Pulse 82    Temp 98.5 F (36.9 C)   Ht 5' 3 (1.6 m)   Wt 141 lb 6.4 oz (64.1 kg)   SpO2 99%   BMI 25.05 kg/m   BP Readings from Last 3 Encounters:  12/18/23 (!) 142/100  11/13/23 122/74  09/16/23 116/79    Wt Readings from Last 3 Encounters:  12/18/23 141 lb 6.4 oz (64.1 kg)  11/13/23 144 lb 4 oz (65.4 kg)  09/16/23 142 lb (64.4 kg)    Physical Exam  Lab Results  Component Value Date   WBC 6.1 08/12/2023   HGB 12.4 08/12/2023   HCT 35.7 (L) 08/12/2023   PLT 178.0 08/12/2023   GLUCOSE 84 10/15/2023   CHOL 229 (H) 06/12/2023   TRIG 85.0 06/12/2023   HDL 56.60 06/12/2023   LDLCALC 156 (H) 06/12/2023   ALT 19 10/15/2023   AST 17 10/15/2023   NA 139 10/15/2023   K 4.1 10/15/2023   CL 106 10/15/2023   CREATININE 0.69 10/15/2023   BUN 11 10/15/2023   CO2 26 10/15/2023   TSH 1.39 06/12/2023   INR 1.2 (H) 11/18/2012   HGBA1C 5.2 12/23/2012    US  Abdomen Complete Result Date: 08/13/2023 CLINICAL DATA:  Fever, epig pain, UTI. Thx EXAM: ABDOMEN ULTRASOUND COMPLETE COMPARISON:  None Available. FINDINGS: Gallbladder: No gallstones or wall thickening visualized. No sonographic Murphy sign  noted by sonographer. Common bile duct: Diameter: Up to 4 mm. No intrahepatic bile duct dilation. Liver: No focal lesion identified. Within normal limits in parenchymal echogenicity. Portal vein is patent on color Doppler imaging with normal direction of blood flow towards the liver. IVC: No abnormality visualized. Pancreas: Visualized portion unremarkable. Spleen: Size and appearance within normal limits. Right Kidney: Length: 11.2 cm. Echogenicity within normal limits. No mass or hydronephrosis visualized. Left Kidney: Length: 10.5 cm. Echogenicity within normal limits. No mass or hydronephrosis visualized. Abdominal aorta: No aneurysm visualized. Other findings: None. IMPRESSION: *Unremarkable abdominal ultrasound examination. Electronically Signed   By: Ree Molt M.D.   On: 08/13/2023 11:11     Assessment & Plan:   Problem List Items Addressed This Visit     B12 deficiency   On B12      COVID-19 - Primary   New #4 Better Depo-medrol  80 mg Zpack Hycodan prn       Relevant Medications   azithromycin  (ZITHROMAX  Z-PAK) 250 MG tablet   Obesity   Wt Readings from Last 3 Encounters:  12/18/23 141 lb 6.4 oz (64.1 kg)  11/13/23 144 lb 4 oz (65.4 kg)  09/16/23 142 lb (64.4 kg)            Meds ordered this encounter  Medications   azithromycin  (ZITHROMAX  Z-PAK) 250 MG tablet    Sig: As directed    Dispense:  6 tablet    Refill:  0   HYDROcodone  bit-homatropine (HYCODAN) 5-1.5 MG/5ML syrup    Sig: Take 5 mLs by mouth every 8 (eight) hours as needed for cough.    Dispense:  240 mL    Refill:  0      Follow-up: Return in about 3 months (around 03/19/2024) for a follow-up visit.  Marolyn Noel, MD

## 2023-12-18 NOTE — Assessment & Plan Note (Signed)
 Wt Readings from Last 3 Encounters:  12/18/23 141 lb 6.4 oz (64.1 kg)  11/13/23 144 lb 4 oz (65.4 kg)  09/16/23 142 lb (64.4 kg)

## 2023-12-18 NOTE — Patient Instructions (Signed)
 For a mild COVID-19 case - take zinc 50 mg a day for 1 week, vitamin C 1000 mg daily for 1 week, vitamin D2 50,000 units weekly for 2 months (unless  taking vitamin D daily already), an antioxidant Quercetin 500 mg twice a day for 1 week (if you can get it quick enough). Take Allegra or Benadryl.  Maintain good oral hydration and take Tylenol for high fever.  Call if problems. Isolate for 5 days, then wear a mask for 5 days per CDC.

## 2023-12-18 NOTE — Assessment & Plan Note (Signed)
 On B12

## 2023-12-18 NOTE — Addendum Note (Signed)
 Addended byBETHA LUCETTA CLEATRICE LELON on: 12/18/2023 10:12 AM   Modules accepted: Orders

## 2023-12-18 NOTE — Assessment & Plan Note (Signed)
 New #4 Better Depo-medrol  80 mg Zpack Hycodan prn

## 2023-12-21 ENCOUNTER — Encounter: Payer: Self-pay | Admitting: Internal Medicine

## 2023-12-21 DIAGNOSIS — B2790 Infectious mononucleosis, unspecified without complication: Secondary | ICD-10-CM

## 2023-12-21 DIAGNOSIS — J4521 Mild intermittent asthma with (acute) exacerbation: Secondary | ICD-10-CM

## 2023-12-21 DIAGNOSIS — U071 COVID-19: Secondary | ICD-10-CM

## 2023-12-21 DIAGNOSIS — J309 Allergic rhinitis, unspecified: Secondary | ICD-10-CM

## 2023-12-23 DIAGNOSIS — J3089 Other allergic rhinitis: Secondary | ICD-10-CM | POA: Diagnosis not present

## 2023-12-23 DIAGNOSIS — J3081 Allergic rhinitis due to animal (cat) (dog) hair and dander: Secondary | ICD-10-CM | POA: Diagnosis not present

## 2023-12-23 DIAGNOSIS — J301 Allergic rhinitis due to pollen: Secondary | ICD-10-CM | POA: Diagnosis not present

## 2023-12-30 DIAGNOSIS — J3089 Other allergic rhinitis: Secondary | ICD-10-CM | POA: Diagnosis not present

## 2023-12-30 DIAGNOSIS — J301 Allergic rhinitis due to pollen: Secondary | ICD-10-CM | POA: Diagnosis not present

## 2023-12-30 DIAGNOSIS — J3081 Allergic rhinitis due to animal (cat) (dog) hair and dander: Secondary | ICD-10-CM | POA: Diagnosis not present

## 2024-01-02 ENCOUNTER — Other Ambulatory Visit: Payer: Self-pay | Admitting: Internal Medicine

## 2024-01-06 DIAGNOSIS — J3081 Allergic rhinitis due to animal (cat) (dog) hair and dander: Secondary | ICD-10-CM | POA: Diagnosis not present

## 2024-01-06 DIAGNOSIS — J301 Allergic rhinitis due to pollen: Secondary | ICD-10-CM | POA: Diagnosis not present

## 2024-01-06 DIAGNOSIS — J3089 Other allergic rhinitis: Secondary | ICD-10-CM | POA: Diagnosis not present

## 2024-01-13 DIAGNOSIS — H1045 Other chronic allergic conjunctivitis: Secondary | ICD-10-CM | POA: Diagnosis not present

## 2024-01-13 DIAGNOSIS — J301 Allergic rhinitis due to pollen: Secondary | ICD-10-CM | POA: Diagnosis not present

## 2024-01-13 DIAGNOSIS — J3089 Other allergic rhinitis: Secondary | ICD-10-CM | POA: Diagnosis not present

## 2024-01-13 DIAGNOSIS — J4530 Mild persistent asthma, uncomplicated: Secondary | ICD-10-CM | POA: Diagnosis not present

## 2024-01-16 DIAGNOSIS — J3089 Other allergic rhinitis: Secondary | ICD-10-CM | POA: Diagnosis not present

## 2024-01-16 DIAGNOSIS — J301 Allergic rhinitis due to pollen: Secondary | ICD-10-CM | POA: Diagnosis not present

## 2024-01-16 DIAGNOSIS — J3081 Allergic rhinitis due to animal (cat) (dog) hair and dander: Secondary | ICD-10-CM | POA: Diagnosis not present

## 2024-01-20 DIAGNOSIS — B999 Unspecified infectious disease: Secondary | ICD-10-CM | POA: Diagnosis not present

## 2024-01-27 DIAGNOSIS — J301 Allergic rhinitis due to pollen: Secondary | ICD-10-CM | POA: Diagnosis not present

## 2024-01-27 DIAGNOSIS — J3081 Allergic rhinitis due to animal (cat) (dog) hair and dander: Secondary | ICD-10-CM | POA: Diagnosis not present

## 2024-01-27 DIAGNOSIS — J3089 Other allergic rhinitis: Secondary | ICD-10-CM | POA: Diagnosis not present

## 2024-03-22 ENCOUNTER — Ambulatory Visit: Admitting: Internal Medicine
